# Patient Record
Sex: Male | Born: 1950 | Race: White | Hispanic: No | Marital: Single | Smoking: Never smoker
Health system: Southern US, Community
[De-identification: ages and names within clinical notes are randomized; demographics above are authoritative.]

## PROBLEM LIST (undated history)

## (undated) DIAGNOSIS — E785 Hyperlipidemia, unspecified: Secondary | ICD-10-CM

## (undated) DIAGNOSIS — E119 Type 2 diabetes mellitus without complications: Secondary | ICD-10-CM

## (undated) DIAGNOSIS — M1990 Unspecified osteoarthritis, unspecified site: Secondary | ICD-10-CM

## (undated) DIAGNOSIS — E669 Obesity, unspecified: Secondary | ICD-10-CM

## (undated) DIAGNOSIS — G4733 Obstructive sleep apnea (adult) (pediatric): Secondary | ICD-10-CM

## (undated) DIAGNOSIS — Z5181 Encounter for therapeutic drug level monitoring: Secondary | ICD-10-CM

## (undated) DIAGNOSIS — E039 Hypothyroidism, unspecified: Secondary | ICD-10-CM

## (undated) DIAGNOSIS — Z9989 Dependence on other enabling machines and devices: Secondary | ICD-10-CM

## (undated) DIAGNOSIS — I4891 Unspecified atrial fibrillation: Secondary | ICD-10-CM

## (undated) DIAGNOSIS — R7303 Prediabetes: Secondary | ICD-10-CM

## (undated) DIAGNOSIS — I10 Essential (primary) hypertension: Secondary | ICD-10-CM

## (undated) DIAGNOSIS — Z79899 Other long term (current) drug therapy: Secondary | ICD-10-CM

## (undated) HISTORY — PX: EYE SURGERY: SHX253

## (undated) HISTORY — DX: Essential (primary) hypertension: I10

## (undated) HISTORY — PX: STAPEDES SURGERY: SHX789

## (undated) HISTORY — PX: FINGER FRACTURE SURGERY: SHX638

## (undated) HISTORY — PX: TYMPANOPLASTY: SHX33

## (undated) HISTORY — PX: SHOULDER ARTHROSCOPY W/ ROTATOR CUFF REPAIR: SHX2400

## (undated) HISTORY — PX: FRACTURE SURGERY: SHX138

## (undated) HISTORY — DX: Obesity, unspecified: E66.9

## (undated) HISTORY — PX: KNEE ARTHROSCOPY W/ DEBRIDEMENT: SHX1867

## (undated) HISTORY — DX: Hyperlipidemia, unspecified: E78.5

## (undated) HISTORY — DX: Type 2 diabetes mellitus without complications: E11.9

## (undated) HISTORY — PX: TONSILLECTOMY: SUR1361

## (undated) HISTORY — PX: RETINAL LASER PROCEDURE: SHX2339

---

## 2000-11-07 ENCOUNTER — Ambulatory Visit (HOSPITAL_COMMUNITY): Admission: RE | Admit: 2000-11-07 | Discharge: 2000-11-07 | Payer: Self-pay | Admitting: Gastroenterology

## 2004-02-14 ENCOUNTER — Encounter: Admission: RE | Admit: 2004-02-14 | Discharge: 2004-05-14 | Payer: Self-pay | Admitting: Family Medicine

## 2004-02-28 ENCOUNTER — Ambulatory Visit (HOSPITAL_COMMUNITY): Admission: RE | Admit: 2004-02-28 | Discharge: 2004-02-28 | Payer: Self-pay | Admitting: Orthopedic Surgery

## 2004-04-17 ENCOUNTER — Ambulatory Visit (HOSPITAL_BASED_OUTPATIENT_CLINIC_OR_DEPARTMENT_OTHER): Admission: RE | Admit: 2004-04-17 | Discharge: 2004-04-17 | Payer: Self-pay | Admitting: Orthopedic Surgery

## 2004-04-21 ENCOUNTER — Ambulatory Visit (HOSPITAL_COMMUNITY): Admission: RE | Admit: 2004-04-21 | Discharge: 2004-04-21 | Payer: Self-pay | Admitting: Orthopedic Surgery

## 2006-10-13 ENCOUNTER — Ambulatory Visit (HOSPITAL_BASED_OUTPATIENT_CLINIC_OR_DEPARTMENT_OTHER): Admission: RE | Admit: 2006-10-13 | Discharge: 2006-10-14 | Payer: Self-pay | Admitting: Orthopedic Surgery

## 2006-12-27 ENCOUNTER — Encounter: Admission: RE | Admit: 2006-12-27 | Discharge: 2007-03-27 | Payer: Self-pay | Admitting: Family Medicine

## 2007-03-13 ENCOUNTER — Encounter: Admission: RE | Admit: 2007-03-13 | Discharge: 2007-03-13 | Payer: Self-pay | Admitting: Family Medicine

## 2007-11-05 ENCOUNTER — Encounter: Admission: RE | Admit: 2007-11-05 | Discharge: 2007-11-05 | Payer: Self-pay | Admitting: Family Medicine

## 2008-01-01 ENCOUNTER — Encounter: Admission: RE | Admit: 2008-01-01 | Discharge: 2008-01-01 | Payer: Self-pay | Admitting: Family Medicine

## 2009-03-05 ENCOUNTER — Ambulatory Visit (HOSPITAL_BASED_OUTPATIENT_CLINIC_OR_DEPARTMENT_OTHER): Admission: RE | Admit: 2009-03-05 | Discharge: 2009-03-05 | Payer: Self-pay | Admitting: Orthopedic Surgery

## 2009-10-22 ENCOUNTER — Ambulatory Visit (HOSPITAL_BASED_OUTPATIENT_CLINIC_OR_DEPARTMENT_OTHER): Admission: RE | Admit: 2009-10-22 | Discharge: 2009-10-22 | Payer: Self-pay | Admitting: Orthopedic Surgery

## 2010-08-24 LAB — POCT I-STAT 4, (NA,K, GLUC, HGB,HCT)
Glucose, Bld: 124 mg/dL — ABNORMAL HIGH (ref 70–99)
HCT: 41 % (ref 39.0–52.0)
Hemoglobin: 13.9 g/dL (ref 13.0–17.0)
Potassium: 3.9 mEq/L (ref 3.5–5.1)
Sodium: 137 mEq/L (ref 135–145)

## 2010-09-11 LAB — BASIC METABOLIC PANEL
BUN: 23 mg/dL (ref 6–23)
CO2: 32 mEq/L (ref 19–32)
Calcium: 9.8 mg/dL (ref 8.4–10.5)
Chloride: 100 mEq/L (ref 96–112)
Creatinine, Ser: 1.22 mg/dL (ref 0.4–1.5)
GFR calc Af Amer: >60 mL/min (ref >60)
GFR calc non Af Amer: >60 mL/min (ref >60)
Glucose, Bld: 104 mg/dL — ABNORMAL HIGH (ref 70–99)
Potassium: 5.1 mEq/L (ref 3.5–5.1)
Sodium: 140 mEq/L (ref 135–145)

## 2010-09-11 LAB — POCT HEMOGLOBIN-HEMACUE: Hemoglobin: 15.7 g/dL (ref 13.0–17.0)

## 2010-10-23 NOTE — Op Note (Signed)
Anthony Cole, Anthony Cole             ACCOUNT NO.:  [id number]   MEDICAL RECORD NO.:  [id number]          PATIENT TYPE:  AMB   LOCATION:  DSC                          FACILITY:  [facility]   PHYSICIAN:  Katy Fitch. Sypher, M.D. 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  Also  significant labral degenerative changes within the glenohumeral joint  that were dressed by labral debridement.   OPERATION:  1. Examination of left shoulder under anesthesia, documenting slight      loss of elevation, external rotation and shoulder extension.  2. Diagnostic arthroscopy, left glenohumeral joint, confirming      extensive degenerative labral changes, a significant articular side      degenerative supraspinatus and infraspinatus tendinopathy with      about a 38-40% partial thickness supraspinatus rotator cuff      degenerative tear with retraction of the deep surface fibers.  3. Arthroscopic subacromial decompression with bursectomy, partial      resection of coracoacromial ligament and extensive medial      acromioplasty.  4. Partial resection of distal clavicle with preservation of AC joint      capsule to os acromiale for long-term stabilization of os      acromiale.   OPERATING SURGEON:  Katy Fitch. Sypher, MD.   ASSISTANTMolly Maduro Dasnoit PA-C.   ANESTHESIA:  General endotracheal supplemented by a left interscalene  block.   SUPERVISING  ANESTHESIOLOGIST:  Janetta Hora. Gelene Mink, MD.   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   He is status post treatment of his right shoulder in 04/2004 for an  unstable os acromiale with chronic impingement and AC arthropathy.  He  at that time was noted to have impingement signs.  An MRI obtained at  [facility] documented an os acromiale with  supraspinatus tendinopathy.   He was taken to the operating room and underwent an arthroscopic  subacromial decompression and distal clavicle resection.  His symptoms  were well controlled by this intervention.   After appropriate rehabilitation, he was discharged and did not return  for followup evaluation for nearly 2 years.   He subsequently returned in 08/2006 concerned about similar symptoms  involving in the left shoulder.   In 2005, we did obtain an MRI of the left shoulder and confirmed the  presence of an os acromiale on the left, as well.   The patient has failed nonoperative management of the left shoulder and  requested that we proceed with an identical procedure to decompress the  left shoulder.  Preoperatively he was evaluated by Dr. Garnette Scheuermann from a cardiology  standpoint and was noted to have a negative stress Cardiolite  preoperative study.  He was cleared by Dr. Katrinka Blazing for general anesthesia.  After informed consent, he was brought to the operating room at this  time.   PROCEDURE:  The patient is brought to the operating room and placed in  the supine position on the operating table.   Following an Anesthesia consult by Dr. Gelene Mink in the holding area, a  left interscalene block was placed without complication.   He was taken to room 2 and placed in the supine position on the table,  and, under Dr. Thornton Dales direct supervision, general endotracheal  anesthesia induced.   He was carefully positioned in  the beach-chair position with the aid of  torso and head holder designed for shoulder arthroscopy.  Examination of  left shoulder under anesthesia revealed elevation of 165, external  rotation 80, internal rotation of 70.  Extension lacked 5 degrees from  the scapular plane.  The shoulder joint was stable to anterior,  posterior and inferior stress.   The shoulder was then distended with 20 cc of sterile saline with  anterior spinal needle approach followed by instrumentation through the  posterior soft spot with a blunt trocar arthroscopic cannula.  Diagnostic arthroscopy confirmed degenerative changes in the labrum and  an unexpected finding of a small area of full-thickness hyaline  cartilage injury to the inferior humeral head just at the position of  the inferior labrum contacting the humeral head with the arm in neutral.  The deep surface of the rotator cuff was inspected.  There was a 30-40%  partial-thickness tear of the supraspinatus with fragments hanging  within the joint.  The labrum was degenerative from approximately 10  o'clock posteriorly to 3 o'clock anteriorly.   An anterior portal was created under direct vision followed by use of a  4.5-mm suction shaver to debride the labrum, adhesive capsulitis tissues  and the partial-thickness rotator cuff tear.   After debridement, photographic documentation of the delaminating cuff  predicament was obtained.   In my judgment, this did not deserve repair at this time.   Attention was then directed to instrumenting the subacromial bursa.   The scope was placed in the subacromial bursa for posterior approach.  We were very challenged to image the subacromial space due to florid  bursitis and adhesion of the supraspinatus to the bursa.  We ultimately  visualized the subacromial space by placing the scope laterally and  shaving from a posterior position.  Once the anatomy of the coracoacromial arch was able to be visualized, it  was apparent that the  primary source of impingement was the medial acromion.  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   The suction shaver was used to debride the capsule followed by use of  the bipolar cautery to obtain hemostasis.  The medial acromion was  leveled to a type-1 morphology with partial resection of the acromion.  Care was taken not to resect the coracoacromial ligament nor the bulk of  the capsule of the [facility] joint.  Our goal was to decompress the medial  osteophyte without disturbing the soft tissue attachments to the os  acromiale.   After proper documentation, the digital camera was used to document the  degree of distal clavicle resection as well as acromioplasty.  We  created a congruous  decompression with approximately 6 mm of clearance.   After hemostasis was achieved, the arthroscopic equipment was removed.  The cuff was inspected thoroughly, and there was no sign of a bursal  side rotator cuff tear or partial thickness injury.   The portals then repaired with mattress suture of 3-0 Prolene.  A  voluminous dressing was applied with sterile gauze, ABD pad and paper  tape.   There no apparent complications.Katy Fitch Sypher, M.D.  Electronically Signed     RVS/MEDQ  D:  10/13/2006  T:  10/13/2006  Job:  [id number]   cc:   Chales Salmon. Abigail Miyamoto, M.D.

## 2010-10-23 NOTE — Procedures (Signed)
[facility]  Patient:    Anthony Cole, Anthony Cole                      MRN: [id number] Proc. Date: 11/07/00 Adm. Date:  [id number] Attending:  Orland Mustard CC:         Chales Salmon. Abigail Miyamoto, M.D.   Procedure Report  PROCEDURE:  Colonoscopy.  MEDICATIONS:  Fentanyl 62.5 mcg, Versed 8 mg IV.  INDICATIONS FOR PROCEDURE:  A patient with heme positive stools. This procedure is done to evaluate for chronic source.  DESCRIPTION OF PROCEDURE:  The procedure had been explained to the patient and consent obtained. With the patient in the left lateral decubitus position, the adult Olympus video colonoscope was inserted and after a digital rectal exam advanced under direct visualization. The prep was excellent. We were able to advance to the cecum without difficulty. The ileocecal valve and appendiceal orifice seen, scope withdrawn. The cecum, ascending colon, hepatic flexure, transverse colon, splenic flexure, descending and sigmoid colon were seen well upon removal. No polyp seen throughout the entire colon. There was no significant diverticular disease. The scope was withdrawn. The patient tolerated the procedure well.  ASSESSMENT:  Heme positive stools.  PLAN:  Will see back in the office in two months to recheck the stools. DD:  11/07/00 TD:  11/08/00 Job: [id number]

## 2010-12-28 ENCOUNTER — Ambulatory Visit: Payer: Self-pay | Admitting: Physical Therapy

## 2010-12-31 ENCOUNTER — Other Ambulatory Visit: Payer: Self-pay | Admitting: Family Medicine

## 2010-12-31 DIAGNOSIS — M545 Low back pain, unspecified: Secondary | ICD-10-CM

## 2011-01-02 ENCOUNTER — Ambulatory Visit
Admission: RE | Admit: 2011-01-02 | Discharge: 2011-01-02 | Disposition: A | Discharge status: Home / Self Care | Payer: Self-pay | Source: Ambulatory Visit | Attending: Family Medicine | Admitting: Family Medicine

## 2011-01-02 DIAGNOSIS — M545 Low back pain: Secondary | ICD-10-CM

## 2011-10-27 ENCOUNTER — Other Ambulatory Visit: Payer: Self-pay | Admitting: Interventional Cardiology

## 2011-10-28 ENCOUNTER — Other Ambulatory Visit: Payer: Self-pay | Admitting: Cardiology

## 2011-10-28 NOTE — H&P (Signed)
HPI:  General:  Anthony Cole is a 61 yo male followed by Dr Katrinka Blazing with a hx of hypertension,, peripheral neuropathy and recent echocardiogram with EF 55%, LVH, LAE, and Aortic root enlargement. He was seen by Dr Katrinka Blazing last month due to onset of Atrial fibrillation of unknown duration which was identified at his annual physical with Dr Tenny Craw. Holter moniotor confirmed 100% Atrial fibrillation with rate controlled. Patient had noted occasional palpitations and elevated heart rate with exercise since January along with dypnea when walking up stairs. He was placed on Xarelto 20 mg po qd on 10/06/11, with plans to consider cardioversion in hopes of returning to NSR after adequately anticoagulated..  Patient denies chest pain, syncope, swelling, nor PND. He has mild occasional dizziness, no vision change.      ROS:  as noted in HPI, no fever, chills, congestion, no neurological changes ,no black or bloody BMS. no respiratory complaints,.       Medical History: elevated PSA, BPH and hypogonadism followed by urologist Dr Earlene Plater, Hypertension, benign, Hypercholesterolemia, Brachial neuritis, [place], Dr Terri Piedra, Atrial fibrillation.        Surgical History: Arthroscopy right knee followed by Dr Despina Hick 03/05/2009, tonsillectomy remotely , left shoulder surgery 2008 , 2007 with right ossiculoplasty , right shoulder surgery 2005 , left knee surgery 10/2009.        Family History: Father: deceased Cause unknown to patient Mother: deceased Lung and breast cancer 1 brother(s) .  Negative GI family history of colon cancer, polyps, or liver disease.       Social History:  General: History of smoking cigarettes: Never smoked. no Smoking. Alcohol: yes, Occasional. Caffeine: yes, 1 serving daily, coffee. no Recreational drug use. Diet: yes, low fat, fruits and vegetables, Whole foods salad bar. Exercise: yes, 5 or 6 times a wk, gym. Aerobic plus 3 days of weights a week, variety. Occupation: Retired. Marital Status: single.  Children: 0.        Medications: Fish Oil 1200 MG Capsule 1 capsule once a day, Metanx 3-35-2 MG Tablet 1 tablet once a day, Doxycycline Hyclate 100 MG Tablet 1 tablet once a day, Vitamin D 400 mg Capsule 1 capsule twice a day, MVI 1 tablet Once a day, Flax Seeds Powder 2 TBS every morning, Xarelto 20 mg . tablet 1 tablet once a day, Metoprolol Succinate 50 MG Tablet Extended Release 24 Hour 1 tablet Once every evening, Crestor 10 MG Tablet 1 tablet Once a day, Medication List reviewed and reconciled with the patient       Allergies: Joint med OTC: Broke face out: Side Effects, Gabapentin: sedation: Side Effects, Lipitor: Myalgias, felt bad: Side Effects.       Objective:     Vitals: Wt 170.8, Wt change 1.4 lb, Ht 69.5, BMI 24.86, Pulse sitting 80, BP sitting 140/90.       Examination:  Cardiology Exam:  GENERAL APPEARANCE: pleasant, NAD, comfortable, thin, young, male. HEENT: normal. CAROTID UPSTROKE: no bruit, upstrokes intact. JVD: flat. HEART: irregularly irregular rate and rhythm, normal S1S2, no rub, no gallop, or click. HEART MURMUR: none. LUNGS: clear to auscultation, no wheezing/rhonchi/rales. ABDOMEN: soft, non-tender, + bowel sounds. EXTREMITIES: no leg edema. PERIPHERAL PULSES: 2+, bilateral. NEUROLOGIC: grossly intact, cranial nerves intact, gait WNL. MOOD: normal.        Assessment:     Assessment:  1. Atrial fibrillation - 427.31 (Primary)  2. Essential hypertension, benign - 401.1  3. Diastolic dysfunction - 429.9    Plan:  1. Atrial fibrillation Continue Xarelto 20 mg tablet, ., 1 tablet, orally, once a day ; Continue Metoprolol Succinate Tablet Extended Release 24 Hour, 50 MG, 1 tablet, Orally, Once every evening .  LAB: Comp Metabolic Panel    GLUCOSE 86 70-99 - mg/dL    BUN 19 1-61 - mg/dL    CREATININE 0.96 0.45-4.09 - mg/dl    eGFR (NON-AFRICAN AMERICAN) 80 >60 - calc    eGFR (AFRICAN AMERICAN) 97 >60 - calc    SODIUM 140 136-145 - mmol/L    POTASSIUM  4.1 3.5-5.5 - mmol/L    CHLORIDE 102 98-107 - mmol/L    C02 32 22-32 - mg/dL    ANION GAP 9.1 8.1-19.1 - mmol/L    CALCIUM 9.5 8.6-10.3 - mg/dL    T PROTEIN 6.1 4.7-8.2 - g/dL    ALBUMIN 4.4 9.5-6.2 - g/dL    T.BILI 0.7 0.3-1.0 - mg/dL    ALP 46 13-086 - U/L    AST 49 0-39 - U/L H   ALT 74 0-52 - U/L H    Anthony Cole A 10/27/2011 04:37:35 PM > ok for cardioversion, CC to Dr Tenny Craw for update increasing ALT    LAB: CBC with Diff    WBC 8.2 4.0-11.0 - K/ul    RBC 4.56 4.20-5.80 - M/uL    HGB 14.7 13.0-17.0 - g/dL    HCT 57.8 46.9-62.9 - %    MCH 32.3 27.0-33.0 - pg    MPV 8.7 7.5-10.7 - fL    MCV 94.4 80.0-94.0 - fL H   MCHC 34.2 32.0-36.0 - g/dL    RDW 52.8 41.3-24.4 - %    NRBC# 0.00 -    PLT 218 150-400 - K/uL    NEUT % 57.9 43.3-71.9 - %    NRBC% 0.00 - %    LYMPH% 28.3 16.8-43.5 - %    MONO % 11.6 4.6-12.4 - %    EOS % 1.5 0.0-7.8 - %    BASO % 0.7 0.0-1.0 - %    NEUT # 4.7 1.9-7.2 - K/uL    LYMPH# 2.30 1.10-2.70 - K/uL    MONO # 0.9 0.3-0.8 - K/uL H   EOS # 0.1 0.0-0.6 - K/uL    BASO # 0.1 0.0-0.1 - K/uL     Anthony Cole A 10/27/2011 12:29:22 PM > ok for cardioversion    LAB: PT (Prothrombin Time) (010272)    Prothrombin Time 13.1 9.1-12.0 - SEC H   INR 1.2 0.8-1.2 -     Anthony Cole A 10/28/2011 12:46:03 PM > for cardioversion, OK    Diagnostic Imaging:EKG Atrial fibrillation rate 67, Anthony Cole,Anthony Cole 10/27/2011 08:37:50 AM >, Chest PA/Lat (Ordered for 10/27/2011) Anthony Cole,Anthony Cole 10/27/2011 09:51:52 AM > , x-ray completed.  At last office visit with Dr Katrinka Blazing, he reviewed in depth options and treatment for Atrial fibrillation with plans to proceed with electrical cardioversion, Risk and Procedure reviewed including the risk of stroke, skin burn, mechanical injury, cardiac arrest, and death. The incidence of life threatening arrhythmias is less than 1% He is aware that an anesthesiologist will provide sedation/anesthesia, > 30 minutes spent today reviewing Atrial  fibrillation, cardioversion procedure and treatment options.       2. Others Continue Crestor Tablet, 10 MG, 1 tablet, Orally, Once a day ; Continue Flax Seeds Powder, 2 TBS, Orally, every morning ; Continue Fish Oil Capsule, 1200 MG, 1 capsule, Orally, once a day .        Immunizations:  Labs:        Procedure Codes: 09811 EKG I AND R, 80053 ECL COMP METABOLIC PANEL, 85025 ECL CBC PLATELET DIFF, 91478 BLOOD COLLECTION ROUTINE VENIPUNCTURE       Preventive:         Follow Up: HS post cardioversion (Reason: Atrial fibrillation)      Provider: Michaell Cowing. Emelda Fear, NP  Patient: Anthony Cole, Anthony Cole DOB: [birth year] Date: 10/27/2011

## 2011-11-02 ENCOUNTER — Encounter (HOSPITAL_COMMUNITY): Payer: Self-pay | Admitting: Pharmacy Technician

## 2011-11-04 ENCOUNTER — Ambulatory Visit (HOSPITAL_COMMUNITY): Payer: BC Managed Care – PPO | Admitting: Critical Care Medicine

## 2011-11-04 ENCOUNTER — Encounter (HOSPITAL_COMMUNITY)
Admission: RE | Disposition: A | Discharge status: Home / Self Care | Payer: Self-pay | Source: Ambulatory Visit | Attending: Interventional Cardiology

## 2011-11-04 ENCOUNTER — Encounter (HOSPITAL_COMMUNITY): Payer: Self-pay | Admitting: Critical Care Medicine

## 2011-11-04 ENCOUNTER — Ambulatory Visit (HOSPITAL_COMMUNITY)
Admission: RE | Admit: 2011-11-04 | Discharge: 2011-11-04 | Disposition: A | Discharge status: Home / Self Care | Payer: BC Managed Care – PPO | Source: Ambulatory Visit | Attending: Interventional Cardiology | Admitting: Interventional Cardiology

## 2011-11-04 DIAGNOSIS — I4891 Unspecified atrial fibrillation: Secondary | ICD-10-CM | POA: Insufficient documentation

## 2011-11-04 HISTORY — PX: CARDIOVERSION: SHX1299

## 2011-11-04 SURGERY — CARDIOVERSION
Anesthesia: General | Wound class: Clean

## 2011-11-04 MED ORDER — PROPOFOL 10 MG/ML IV BOLUS
INTRAVENOUS | Status: DC | PRN
  Administered 2011-11-04: 80 mg via INTRAVENOUS
  Administered 2011-11-04: 120 mg via INTRAVENOUS

## 2011-11-04 MED ORDER — SODIUM CHLORIDE 0.9 % IV SOLN
INTRAVENOUS | Status: DC | PRN
  Administered 2011-11-04: 12:00:00 via INTRAVENOUS

## 2011-11-04 NOTE — Preoperative (Signed)
Beta Blockers   Reason not to administer Beta Blockers:Not Applicable 

## 2011-11-04 NOTE — H&P (Signed)
61 year old gentleman with a history of atrial fibrillation of unknown duration. Left atrial size by echo was 41 mm. He's been on anticoagulation for 4 weeks. He is undergone electrical cardioversion to reestablish normal sinus rhythm. He understands the procedure and the potential risk of musculoskeletal injury, lethal arrhythmia, infection, allergy, among others. He is willing to proceed.

## 2011-11-04 NOTE — Anesthesia Postprocedure Evaluation (Signed)
  Anesthesia Post-op Note  Patient: Anthony Cole  Procedure(s) Performed: Procedure(s) (LRB): CARDIOVERSION (N/A)  Patient Location: PACU  Anesthesia Type: MAC and General  Level of Consciousness: awake  Airway and Oxygen Therapy: Patient Spontanous Breathing  Post-op Pain: mild  Post-op Assessment: Post-op Vital signs reviewed  Post-op Vital Signs: Reviewed  Complications: No apparent anesthesia complications

## 2011-11-04 NOTE — Discharge Instructions (Signed)
PATIENT INSTRUCTIONS  POST-ANESTHESIA    IMMEDIATELY FOLLOWING SURGERY:  Do not drive or operate machinery for the first twenty four hours after surgery.  Do not make any important decisions for twenty four hours after surgery or while taking narcotic pain medications or sedatives.  If you develop intractable nausea and vomiting or a severe headache please notify your doctor immediately.    FOLLOW-UP:  Please make an appointment with your surgeon as instructed. You do not need to follow up with anesthesia unless specifically instructed to do so    QUESTIONS?:  Please feel free to call your physician or the hospital operator if you have any questions, and they will be happy to assist you.       [facility] Anesthesia Department  [address]  [place]  [phone]

## 2011-11-04 NOTE — Anesthesia Preprocedure Evaluation (Addendum)
Anesthesia Evaluation  Patient identified by MRN, date of birth, ID band Patient awake    Reviewed: Allergy & Precautions, H&P , NPO status , Patient's Chart, lab work & pertinent test results, reviewed documented beta blocker date and time   Airway       Dental  (+) Dental Advisory Given   Pulmonary  Dr. Katrinka Blazing discussed CXR report with patient prior to procedure. CE         Cardiovascular hypertension, Pt. on home beta blockers     Neuro/Psych Peripheral neuropathy negative neurological ROS     GI/Hepatic   Endo/Other  negative endocrine ROS  Renal/GU negative Renal ROS     Musculoskeletal   Abdominal   Peds  Hematology negative hematology ROS (+)   Anesthesia Other Findings   Reproductive/Obstetrics                        Anesthesia Physical Anesthesia Plan  ASA: II  Anesthesia Plan: General   Post-op Pain Management:    Induction: Intravenous  Airway Management Planned: Mask  Additional Equipment:   Intra-op Plan:   Post-operative Plan:   Informed Consent: I have reviewed the patients History and Physical, chart, labs and discussed the procedure including the risks, benefits and alternatives for the proposed anesthesia with the patient or authorized representative who has indicated his/her understanding and acceptance.   Dental advisory given  Plan Discussed with: CRNA, Anesthesiologist and Surgeon  Anesthesia Plan Comments:         Anesthesia Quick Evaluation

## 2011-11-04 NOTE — CV Procedure (Signed)
Electrical Cardioversion Procedure Note Anthony Cole [id number] September 16, 1950  Procedure: Electrical Cardioversion Indications:  Atrial Fibrillation  Time Out: Verified patient identification, verified procedure,medications/allergies/relevent history reviewed, required imaging and test results available.  Performed  Procedure Details  The patient was NPO after midnight. Anesthesia was administered at the beside  by Dr. Doretha Sou with 120 mg of propofol.  Cardioversion was done with synchronized biphasic defibrillation with AP pads with 200 watts biphasic.  The patient converted to normal sinus rhythm/sinus bradycardia. The patient tolerated the procedure well .  IMPRESSION:  Successful cardioversion of atrial fibrillation to sinus bradycardia.    Anthony Cole 11/04/2011, 12:33 PM

## 2011-11-04 NOTE — Anesthesia Procedure Notes (Signed)
Date/Time: 11/04/2011 12:23 PM Performed by: Elon Alas Pre-anesthesia Checklist: Patient identified, Emergency Drugs available, Suction available, Patient being monitored and Timeout performed Patient Re-evaluated:Patient Re-evaluated prior to inductionPreoxygenation: Pre-oxygenation with 100% oxygen Intubation Type: IV induction Ventilation: Mask ventilation without difficulty Placement Confirmation: breath sounds checked- equal and bilateral

## 2011-11-04 NOTE — Transfer of Care (Signed)
Immediate Anesthesia Transfer of Care Note  Patient: Anthony Cole  Procedure(s) Performed: Procedure(s) (LRB): CARDIOVERSION (N/A)  Patient Location: Short Stay  Anesthesia Type: General  Level of Consciousness: awake, alert  and oriented  Airway & Oxygen Therapy: Patient Spontanous Breathing  Post-op Assessment: Report given to PACU RN, Post -op Vital signs reviewed and stable and Patient moving all extremities X 4  Post vital signs: Reviewed and stable  Complications: No apparent anesthesia complications

## 2011-11-05 ENCOUNTER — Encounter (HOSPITAL_COMMUNITY): Payer: Self-pay | Admitting: Interventional Cardiology

## 2012-07-11 ENCOUNTER — Encounter: Payer: BC Managed Care – PPO | Attending: Family Medicine | Admitting: Dietician

## 2012-07-11 ENCOUNTER — Encounter: Payer: Self-pay | Admitting: Dietician

## 2012-07-11 VITALS — Ht 69.5 in | Wt 175.0 lb

## 2012-07-11 DIAGNOSIS — R7309 Other abnormal glucose: Secondary | ICD-10-CM | POA: Insufficient documentation

## 2012-07-11 DIAGNOSIS — Z713 Dietary counseling and surveillance: Secondary | ICD-10-CM | POA: Insufficient documentation

## 2012-07-11 DIAGNOSIS — E039 Hypothyroidism, unspecified: Secondary | ICD-10-CM | POA: Insufficient documentation

## 2012-07-11 NOTE — Progress Notes (Signed)
Medical Nutrition Therapy:  Appt start time: 1100 end time:  1200.   Assessment:  Primary concerns today: Comes today with a diagnosis of pre-diabetes with history of HTN, Hypercholesterolemia, and Atrial Fibrillation.  Comes wanting to know how to eat and to keep his glucose under control and not have to take diabetes medications.  Notes that a few months ago, his weight was at 170 lb and since then, he has been at times on a binge eating candy which has led to a 5.0 lb weight gain.  BLOOD GLUCOSE MONITORING: Monitoring 2-3 times per week. Does not have his meter or his glucose log.  This morning fasting his glucose was at 122 mg.  HYPERGLYCEMIA:  No noted S/S of high blood glucose.  HYPOGLYCEMIA:  No noted S/S of low blood glucose.     MEDICATIONS: Completed review of meds.   DIETARY INTAKE:  Usual eating pattern includes 3 meals and 1-2 snacks per day.  Everyday foods include vegetables, some starch, protein, milk.  Avoided foods include non noted.    24-hr recall: Water with thyroid med B ( AM): Ground chicken breast with sandwich thins OR 3 boiled eggs (2 as whites and 1 with yoke) and slice of bread (whole wheat, great harvest) and the Spinach mixture and skim made  with sometimes Ovaletine and sometimes some decaf coffee.  Steel cut oatmeal with almonds, and truvia or Splenda and water.     Snk ( AM): none Water L ( PM): salmon, piece and veggie,  OR salad bar at whole foods.  Fish, or chicken,  Quinoa broccoli, cauliflower, shredded beet, limit processed foods.occassionally a piece of bread. Skim milk   At the gym decaf tea and then 16 oz of water with crystal light. Snk ( PM): If hungry will have tuna or sardines with bread (1 piece) D ( PM): 5-6:00 PM  Pizza with lean chopped sirloin.  And water.  OR fish, chicken, (eats less beef) and veggies or the Spinach or collards. , baby kale  Snk ( PM): chocolate covered peanuts.  Increased intake over the last 2-3 weeks.  Beverages:  decaf coffee, skim milk, water water with crystal light.  Usual physical activity: Exercises 5-6 times per week.  This is 30 minutes of tim on the treadmill or bike or he Anthony Cole walk for 40 minutes in his neighborhood in good weather.  Estimated energy needs:   HT:  69.5 in  WT: 175.0 lbs   BMI: 25.5 kg/m2    Goal WT: 165-170 lb  1800-2000 calories  (when maintaining activity) 20--210 g carbohydrates 135-140 g protein 49-51 g fat  Progress Towards Goal(s):  In progress.   Nutritional Diagnosis:  [place]-2.1 Inpaired nutrition utilization As related to blood glucose.  As evidenced by diagnosis of pre-diabetes and fasting glusose of 122 mg/dl.    Intervention:  Nutrition Recommend continuing to monitor intake.  Continue to use the unsweetened beverages and the sugar substitute in moderation.  Read food labels and aim for fiber at 2 gm per slice of bread (whole grain or whole wheat) and 3+ gm of fiber per serving of cereals, vegetables.  When dining out, use the www.calorieking.com web site for referencing the carbs/sugar/fiber/calories per serving of various items.  Avoid sweets and candy.  Plan to have fruit as a source of sweets.  Monitor portion sizes, but plan them into your meals and snacks.  Continue to do your heart healthy practices.  Plan for a lean protein source at all  meals and snacks. Try keeping a diary of your intake for 2 week days and 1 weekend day and include carbs, calories in your records and your blood glucose readings.  Aim for 60 gm of CHO for meals and lean meats with 1-3 servings of fat at each meal.  Aim for 15 gm of carb with a lean protein for snacks.  Handouts given during visit include:  Living Well With Diabetes  Yellow Card with diet prescription and Carb servings/portions  Snack list   Menu with meal suggestions for 1800 calorie/60 gm CHO per meal  Monitoring/Evaluation:  Dietary intake, exercise, blood , and body weight In 12 weeks, check with insurance in the  meantime for coverage, and call or e-mail with questions.[name]

## 2012-07-19 ENCOUNTER — Encounter: Payer: Self-pay | Admitting: Dietician

## 2012-09-20 ENCOUNTER — Ambulatory Visit: Payer: BC Managed Care – PPO | Admitting: Dietician

## 2012-11-18 ENCOUNTER — Emergency Department (HOSPITAL_COMMUNITY)
Admission: EM | Admit: 2012-11-18 | Discharge: 2012-11-18 | Disposition: A | Discharge status: Home / Self Care | Payer: BC Managed Care – PPO | Attending: Emergency Medicine | Admitting: Emergency Medicine

## 2012-11-18 ENCOUNTER — Emergency Department (HOSPITAL_COMMUNITY)
Admission: EM | Admit: 2012-11-18 | Discharge: 2012-11-18 | Disposition: A | Discharge status: Home / Self Care | Payer: BC Managed Care – PPO | Source: Home / Self Care

## 2012-11-18 ENCOUNTER — Encounter (HOSPITAL_COMMUNITY): Payer: Self-pay | Admitting: Family Medicine

## 2012-11-18 DIAGNOSIS — H539 Unspecified visual disturbance: Secondary | ICD-10-CM

## 2012-11-18 DIAGNOSIS — E119 Type 2 diabetes mellitus without complications: Secondary | ICD-10-CM | POA: Insufficient documentation

## 2012-11-18 DIAGNOSIS — H538 Other visual disturbances: Secondary | ICD-10-CM | POA: Insufficient documentation

## 2012-11-18 DIAGNOSIS — E785 Hyperlipidemia, unspecified: Secondary | ICD-10-CM | POA: Insufficient documentation

## 2012-11-18 DIAGNOSIS — I4891 Unspecified atrial fibrillation: Secondary | ICD-10-CM | POA: Insufficient documentation

## 2012-11-18 DIAGNOSIS — I10 Essential (primary) hypertension: Secondary | ICD-10-CM | POA: Insufficient documentation

## 2012-11-18 DIAGNOSIS — Z79899 Other long term (current) drug therapy: Secondary | ICD-10-CM | POA: Insufficient documentation

## 2012-11-18 HISTORY — DX: Unspecified atrial fibrillation: I48.91

## 2012-11-18 NOTE — ED Notes (Signed)
CBG 102. Visual acuity 20/30 in both eyes, then 20/30 in right eye, 20/30 left eye all with glasses on.

## 2012-11-18 NOTE — ED Notes (Signed)
Patient discharged to home with family. NAD.  

## 2012-11-18 NOTE — ED Provider Notes (Signed)
History     CSN: [id number]  Arrival date & time 11/18/12  1627   None     Chief Complaint  Patient presents with  . Eye Problem    (Consider location/radiation/quality/duration/timing/severity/associated sxs/prior treatment) HPI Complains of floaters in left eye which are chronic. The pain becoming progressively worse since last night. He states sometimes he feels as if part of his vision is blocked. He denies pain anywhere. Denies other complaint. No treatment prior to coming here. No eye pain. Past Medical History  Diagnosis Date  . Diabetes mellitus without complication   . Hypertension   . Hyperlipidemia   . Atrial fibrillation     Past Surgical History  Procedure Laterality Date  . Cardioversion  11/04/2011    Procedure: CARDIOVERSION;  Surgeon: Lesleigh Noe, MD;  Location: [facility] OR;  Service: Cardiovascular;  Laterality: N/A;    History reviewed. No pertinent family history.  History  Substance Use Topics  . Smoking status: Never Smoker   . Smokeless tobacco: Never Used  . Alcohol Use: No      Review of Systems  Constitutional: Negative.   HENT: Negative.   Eyes: Positive for visual disturbance.  Respiratory: Negative.   Cardiovascular: Negative.   Gastrointestinal: Negative.   Musculoskeletal: Negative.   Skin: Negative.   Neurological: Negative.   Psychiatric/Behavioral: Negative.   All other systems reviewed and are negative.    Allergies  Gabapentin and Glucosamine forte  Home Medications   Current Outpatient Rx  Name  Route  Sig  Dispense  Refill  . amiodarone (PACERONE) 100 MG tablet   Oral   Take 100 mg by mouth daily.         . cholecalciferol (VITAMIN D) 1000 UNITS tablet   Oral   Take 1,000 Units by mouth every morning.         [name] doxycycline (VIBRA-TABS) 100 MG tablet   Oral   Take 100 mg by mouth every morning.         . Flaxseed, Linseed, (FLAXSEED OIL PO)   Oral   Take 5 mLs by mouth every morning.         [name]  L-Methylfolate-B6-B12 (FOLTANX PO)   Oral   Take 1 tablet by mouth every morning.         [name] levothyroxine (SYNTHROID, LEVOTHROID) 50 MCG tablet   Oral   Take 50 mcg by mouth daily.         . metoprolol succinate (TOPROL-XL) 50 MG 24 hr tablet   Oral   Take 50 mg by mouth every evening. Take with or immediately following a meal.         . Multiple Vitamin (MULITIVITAMIN WITH MINERALS) TABS   Oral   Take 1 tablet by mouth every morning.         [name] omega-3 acid ethyl esters (LOVAZA) 1 G capsule   Oral   Take 1 g by mouth every morning.         . rivaroxaban (XARELTO) 10 MG TABS tablet   Oral   Take 20 mg by mouth every morning.         . rosuvastatin (CRESTOR) 10 MG tablet   Oral   Take 10 mg by mouth every morning.           BP 148/81  Pulse 60  Temp(Src) 98 F (36.7 C) (Oral)  Resp 20  SpO2 98%  Physical Exam  Nursing note and vitals reviewed. Constitutional: He appears  well-developed and well-nourished.  HENT:  Head: Normocephalic and atraumatic.  Visual acuity left eye 20/30, right eye 20/30  Eyes: Conjunctivae and EOM are normal. Pupils are equal, round, and reactive to light. Left eye exhibits no discharge. No scleral icterus.  No visual field deficit  Neck: Neck supple. No tracheal deviation present. No thyromegaly present.  Cardiovascular: Normal rate and regular rhythm.   No murmur heard. Pulmonary/Chest: Effort normal and breath sounds normal.  Abdominal: Soft. Bowel sounds are normal. He exhibits no distension. There is no tenderness.  Musculoskeletal: Normal range of motion. He exhibits no edema and no tenderness.  Neurological: He is alert. Coordination normal.  Skin: Skin is warm and dry. No rash noted.  Psychiatric: He has a normal mood and affect.    ED Course  Procedures (including critical care time)  Labs Reviewed - No data to display No results found.   No diagnosis found.    MDM   I'm concerned about the detached  retina. I spoke with Dr. Dione Booze who will see patient immediately upon leaving here at his office Diagnosis scotomata        Doug Sou, MD 11/18/12 1755

## 2012-11-18 NOTE — ED Notes (Signed)
Per pt sts left eye floaters that started yesterday but worsening today. Denies pain.

## 2013-02-20 ENCOUNTER — Other Ambulatory Visit: Payer: Self-pay | Admitting: Interventional Cardiology

## 2013-02-20 DIAGNOSIS — I4891 Unspecified atrial fibrillation: Secondary | ICD-10-CM

## 2013-02-20 DIAGNOSIS — Z79899 Other long term (current) drug therapy: Secondary | ICD-10-CM

## 2013-03-26 ENCOUNTER — Other Ambulatory Visit: Payer: Self-pay | Admitting: *Deleted

## 2013-03-26 DIAGNOSIS — I4891 Unspecified atrial fibrillation: Secondary | ICD-10-CM

## 2013-03-28 ENCOUNTER — Encounter (INDEPENDENT_AMBULATORY_CARE_PROVIDER_SITE_OTHER): Payer: Self-pay

## 2013-03-28 ENCOUNTER — Other Ambulatory Visit: Payer: Self-pay | Admitting: Pharmacist

## 2013-03-28 ENCOUNTER — Ambulatory Visit (INDEPENDENT_AMBULATORY_CARE_PROVIDER_SITE_OTHER): Payer: BC Managed Care – PPO | Admitting: Interventional Cardiology

## 2013-03-28 ENCOUNTER — Encounter: Payer: Self-pay | Admitting: Interventional Cardiology

## 2013-03-28 VITALS — BP 151/87 | HR 64 | Wt 188.0 lb

## 2013-03-28 DIAGNOSIS — I10 Essential (primary) hypertension: Secondary | ICD-10-CM | POA: Insufficient documentation

## 2013-03-28 DIAGNOSIS — I4891 Unspecified atrial fibrillation: Secondary | ICD-10-CM

## 2013-03-28 DIAGNOSIS — Z79899 Other long term (current) drug therapy: Secondary | ICD-10-CM

## 2013-03-28 DIAGNOSIS — I480 Paroxysmal atrial fibrillation: Secondary | ICD-10-CM | POA: Insufficient documentation

## 2013-03-28 DIAGNOSIS — Z7901 Long term (current) use of anticoagulants: Secondary | ICD-10-CM | POA: Insufficient documentation

## 2013-03-28 DIAGNOSIS — Z5181 Encounter for therapeutic drug level monitoring: Secondary | ICD-10-CM | POA: Insufficient documentation

## 2013-03-28 LAB — HEPATIC FUNCTION PANEL
Alkaline Phosphatase: 58 U/L (ref 39–117)
Bilirubin, Direct: 0 mg/dL (ref 0.0–0.3)
Total Bilirubin: 0.6 mg/dL (ref 0.3–1.2)
Total Protein: 7.2 g/dL (ref 6.0–8.3)

## 2013-03-28 MED ORDER — RIVAROXABAN 20 MG PO TABS: 20.0000 mg | ORAL_TABLET | Freq: Every day | ORAL | Status: DC

## 2013-03-28 NOTE — Telephone Encounter (Signed)
Refill request for his Xarelto.  Recent hemoglobin 14.7 and Scr 1.08.  Okay to refill Xarelto 20 mg qd.  Done.

## 2013-03-28 NOTE — Patient Instructions (Addendum)
Lab today TSH, Hepatic panel  Your physician has recommended that you have a sleep study. This test records several body functions during sleep, including: brain activity, eye movement, oxygen and carbon dioxide blood levels, heart rate and rhythm, breathing rate and rhythm, the flow of air through your mouth and nose, snoring, body muscle movements, and chest and belly movement.  Your physician wants you to follow-up in: 6 months You will receive a reminder letter in the mail two months in advance. If you don't receive a letter, please call our office to schedule the follow-up appointment.  A chest x-ray takes a picture of the organs and structures inside the chest, including the heart, lungs, and blood vessels. This test can show several things, including, whether the heart is enlarges; whether fluid is building up in the lungs; and whether pacemaker / defibrillator leads are still in place.(To Be Scheduled April 2015)  Your physician recommends that you return for lab work in: April 2015 (TSH,Hepatic panel)

## 2013-03-28 NOTE — Progress Notes (Signed)
Patient ID: Anthony Cole, male   DOB: 1950-07-06, 62 y.o.   MRN: [id number]    [address] [place], [place]  [address] Phone: [phone] Fax:  [phone]  Date:  03/28/2013   ID:  Anthony Cole, DOB 02/23/1951, MRN [id number]  PCP:  Miguel Aschoff, MD   ASSESSMENT:  1. Paroxysmal atrial fibrillation  2. Amiodarone therapy  3. Chronic anticoagulation therapy,Xarelto  4. Hypertension  PLAN:  1. Continue same medical regimen. The patient decided against ablation(for now). He can decide if he wants to remain on amiodarone or switch to dofetilide or sotalol. I have recommended that he switch to one of the latter medications.  2. TSH and hepatic panel today and in 6 months 1 week followup he will have a chest x-ray along with the same blood work.  3. Six-month followup    Prolonged office visit due to multiple questions and anxiety.  SUBJECTIVE: Anthony Cole is a 62 y.o. male is very anxious to have a very few episodes of atrial fibrillation. He is exercising on a regular basis. He was to have the entire conversation again that he has had twice now with Dr. Macon Large. He is undecided about ablation versus continuation of amiodarone versus switching to another agent. He's had very few episodes of atrial fibrillation. He has no limitations. No bleeding on Xarelto.   Wt Readings from Last 3 Encounters:  03/28/13 198 lb (89.812 kg)  07/11/12 175 lb (79.379 kg)  11/04/11 170 lb (77.111 kg)     Past Medical History  Diagnosis Date  . Diabetes mellitus without complication   . Hypertension   . Hyperlipidemia   . Atrial fibrillation     Current Outpatient Prescriptions  Medication Sig Dispense Refill  . amiodarone (PACERONE) 200 MG tablet Take 200 mg by mouth daily before breakfast.      . B Complex Vitamins (VITAMIN B COMPLEX PO) Take 1 tablet by mouth daily.      . cholecalciferol (VITAMIN D) 1000 UNITS tablet Take 1,000 Units by mouth every  morning.      [name] doxycycline (VIBRA-TABS) 100 MG tablet Take 100 mg by mouth daily as needed (for rosacea on face).       [name] levothyroxine (SYNTHROID, LEVOTHROID) 50 MCG tablet Take 50 mcg by mouth daily.      . metoprolol succinate (TOPROL-XL) 50 MG 24 hr tablet Take 50 mg by mouth every evening. Take with or immediately following a meal.      . Multiple Vitamin (MULITIVITAMIN WITH MINERALS) TABS Take 1 tablet by mouth every morning.      . Rivaroxaban (XARELTO) 20 MG TABS tablet Take 1 tablet (20 mg total) by mouth daily.  30 tablet  5  . rosuvastatin (CRESTOR) 10 MG tablet Take 10 mg by mouth every morning.       No current facility-administered medications for this visit.    Allergies:    Allergies  Allergen Reactions  . Gabapentin Other (See Comments)    Causes Extreme sedation  . Other     Not sure of name of med-for overactive bladder-caused some adverse reactions, stopped taking immediately-per patient  . Glucosamine Forte [Nutritional Supplements] Rash    Higher doses Causes Facial rash on face, lower doses are ok.     Social History:  The patient  reports that he has never smoked. He has never used smokeless tobacco. He reports that he does not drink alcohol or use illicit drugs.  ROS:  Please see the history of present illness.   Anxiety about his health   All other systems reviewed and negative.   OBJECTIVE: VS:  BP 151/87  Pulse 64  Wt 198 lb (89.812 kg)  BMI 28.83 kg/m2 Well nourished, well developed, in no acute distress HEENT: normal Neck: JVD flat. Carotid bruit none  Cardiac:  normal S1, S2; RRR; no murmur Lungs:  clear to auscultation bilaterally, no wheezing, rhonchi or rales Abd: soft, nontender, no hepatomegaly Ext: Edema none. Pulses 2+ Skin: warm and dry Neuro:  CNs 2-12 intact, no focal abnormalities noted  EKG:  ECG from 03/05/13 reveals LVH with strain.       Signed, Darci Needle III, MD 03/28/2013 1:29 PM

## 2013-03-29 ENCOUNTER — Other Ambulatory Visit: Payer: Self-pay

## 2013-03-29 ENCOUNTER — Telehealth: Payer: Self-pay

## 2013-03-29 DIAGNOSIS — Z79899 Other long term (current) drug therapy: Secondary | ICD-10-CM

## 2013-03-29 NOTE — Telephone Encounter (Signed)
lmom. labs ok repeat 28mo

## 2013-03-29 NOTE — Telephone Encounter (Signed)
Message copied by Jarvis Newcomer on Thu Mar 29, 2013  3:13 PM ------      Message from: Verdis Prime      Created: Thu Mar 29, 2013  1:58 PM       Normal. We will repeat in 6 months to follow amio ------

## 2013-04-02 NOTE — Progress Notes (Signed)
Liver and thyroid are normal.

## 2013-05-04 ENCOUNTER — Telehealth: Payer: Self-pay | Admitting: Interventional Cardiology

## 2013-05-04 NOTE — Telephone Encounter (Signed)
New Problem:  Pt is asking if he needs his appt on 12/3. Pt was last seen on 10/22.... Pt wants to know when Dr. Katrinka Blazing wants to see him next. Please advise

## 2013-05-04 NOTE — Telephone Encounter (Signed)
After reviewing patients chart, patient is due to come back to office in April 2015 and labs due at that time also. Patient informed that appointments scheduled for 05/09/13 will be canceled.

## 2013-05-09 ENCOUNTER — Other Ambulatory Visit: Payer: BC Managed Care – PPO

## 2013-05-09 ENCOUNTER — Ambulatory Visit: Payer: BC Managed Care – PPO | Admitting: Interventional Cardiology

## 2013-05-21 ENCOUNTER — Other Ambulatory Visit: Payer: Self-pay | Admitting: Interventional Cardiology

## 2013-06-19 ENCOUNTER — Ambulatory Visit (INDEPENDENT_AMBULATORY_CARE_PROVIDER_SITE_OTHER): Payer: BC Managed Care – PPO

## 2013-06-19 ENCOUNTER — Telehealth: Payer: Self-pay

## 2013-06-19 ENCOUNTER — Telehealth: Payer: Self-pay | Admitting: Interventional Cardiology

## 2013-06-19 DIAGNOSIS — I4891 Unspecified atrial fibrillation: Secondary | ICD-10-CM

## 2013-06-19 NOTE — Telephone Encounter (Signed)
returned pt call.pt sts that he was at the gym yesterday.and will exercicing his heartrate increased to the 130's.pt sts that when he checked his radial pulse.it was not fast but he coulf feel skipped beats.pt sts that he beleiveds he is still out of rhythm today.pt sts that his asymptomatic.he denies chest pain, sob. Pt want to come into the office today for a nurse visit EKG. Ok per TXU Corpina Lurz.pt given instructions to come in today at 2:30pm.pt agreeable with plan and verbalized understanding.

## 2013-06-19 NOTE — Telephone Encounter (Signed)
Pt came in the office for a Nurse visit EKG.pt  EKG showed atrial fib with rate control. Pt EKG reviewed by Clemmie Krillhris Berge,NP.pt should currently be taking amiodarone 200mg  daily.pt sts that yesterday afternoon when he realized he was out of rhythm he took 400mg  of amiodarone, and today took 200mg  amiodarone bid.pt was very anxious and asked if something like this happened again if he could just walk in.adv pt that he should always call the office first.pt adv Dr.Smith will review his EKG and we will call tomorrow with Dr.Smith recommendations.

## 2013-06-19 NOTE — Telephone Encounter (Signed)
New message     On amiodarone----pt says he is now in afib.  Pt want to talk to a nurse.

## 2013-06-20 MED ORDER — AMIODARONE HCL 200 MG PO TABS: 200.0000 mg | ORAL_TABLET | Freq: Two times a day (BID) | ORAL | Status: DC

## 2013-06-20 NOTE — Telephone Encounter (Signed)
Continue Amiodarone 200 mg BID and setup for cardioversion on my next hospital day.

## 2013-06-20 NOTE — Telephone Encounter (Signed)
lmom for pt to call back. pt is to increase amiodarone to 200mg  bid.called pt to schedule possible Dccv with Dr.Smith for Fri 06/22/13.

## 2013-06-21 ENCOUNTER — Telehealth: Payer: Self-pay

## 2013-06-21 NOTE — Telephone Encounter (Signed)
called pt to give Dr.Smith recommendations. Increase amiodarone to 200mg  twice daily.f/u with Dr.Smith scheduled for 06/27/13 @1 :15 to discuss possible dccv.

## 2013-06-25 NOTE — Telephone Encounter (Signed)
error 

## 2013-06-27 ENCOUNTER — Ambulatory Visit (INDEPENDENT_AMBULATORY_CARE_PROVIDER_SITE_OTHER): Payer: BC Managed Care – PPO | Admitting: Interventional Cardiology

## 2013-06-27 ENCOUNTER — Encounter: Payer: Self-pay | Admitting: Interventional Cardiology

## 2013-06-27 VITALS — BP 134/82 | HR 70 | Ht 69.0 in | Wt 189.0 lb

## 2013-06-27 DIAGNOSIS — I480 Paroxysmal atrial fibrillation: Secondary | ICD-10-CM

## 2013-06-27 DIAGNOSIS — I4891 Unspecified atrial fibrillation: Secondary | ICD-10-CM

## 2013-06-27 DIAGNOSIS — I10 Essential (primary) hypertension: Secondary | ICD-10-CM

## 2013-06-27 DIAGNOSIS — Z79899 Other long term (current) drug therapy: Secondary | ICD-10-CM

## 2013-06-27 DIAGNOSIS — I4892 Unspecified atrial flutter: Secondary | ICD-10-CM | POA: Insufficient documentation

## 2013-06-27 DIAGNOSIS — Z7901 Long term (current) use of anticoagulants: Secondary | ICD-10-CM

## 2013-06-27 DIAGNOSIS — E119 Type 2 diabetes mellitus without complications: Secondary | ICD-10-CM

## 2013-06-27 LAB — BASIC METABOLIC PANEL
BUN: 23 mg/dL (ref 6–23)
CALCIUM: 9.6 mg/dL (ref 8.4–10.5)
CHLORIDE: 103 meq/L (ref 96–112)
CO2: 33 mEq/L — ABNORMAL HIGH (ref 19–32)
CREATININE: 1.2 mg/dL (ref 0.4–1.5)
GFR: 64.06 mL/min (ref >60.00)
Glucose, Bld: 104 mg/dL — ABNORMAL HIGH (ref 70–99)
Potassium: 4.8 mEq/L (ref 3.5–5.1)
Sodium: 141 mEq/L (ref 135–145)

## 2013-06-27 NOTE — Progress Notes (Signed)
Patient ID: Anthony Cole, male   DOB: 02/07/1951, 63 y.o.   MRN: [id number] Medical History: elevated PSA, BPH and hypogonadism followed by urologist Dr Earlene Plateravis, Hypertension, benign, Hypercholesterolemia, Brachial neuritis, Rosocea, Dr Terri PiedraLupton, Atrial fibrillation, 5/13 successful cardioversion then return of AFib.     [address] [place], [place]  [address] Phone: [phone] Fax:  [phone]  Date:  06/27/2013   ID:  Anthony Cole, DOB 02/07/1951, MRN [id number]  PCP:  Miguel AschoffOSS,ALLAN, MD   ASSESSMENT:  1. Atrial flutter with controlled ventricular response documented on 06/27/2013 2. History of atrial fibrillation with prior electrical cardioversion 2014 3. Chronic anticoagulation therapy 4. Hypertension 5. Diabetes  PLAN:  1. Continue amiodarone 400 mg daily 2. Continue anticoagulation daily 3. Elective cardioversion as soon as possible. Indication has been rediscussed with the patient. Risks including death, stroke, and mechanical injury have been discussed. The patient understands that the electrical cardioversion is not a permanent solution for atrial flutter or a cure but simply a means to get the heart back in rhythm. After some considerable discussion, he has decided to proceed. 4. Discussed medical management versus ablative procedures. He is not in favor of an ablation approach at this time. SUBJECTIVE: Anthony Cole is a 63 y.o. male who has a history of atrial fibrillation and prior electrical cardioversion in 2013 (May). He has been on amiodarone therapy for suppression. [facility] and management strategy was not changed. In December 2013 we decreased amiodarone to 100 mg per day. In April 2014 he redeveloped atrial fibrillation and we increased amiodarone dose to 200 mg per day. He spontaneously reverted back to normal sinus rhythm and remained in rhythm until 06/18/2013 when he noticed a rapid heart rate while  exercising. An EKG done at our office the next day demonstrated atrial fibrillation. I increased amiodarone dose to 200 mg twice a day. He returns today for management considerations. EKG today demonstrates atrial flutter with variable block. He is asymptomatic.   Wt Readings from Last 3 Encounters:  06/27/13 189 lb (85.73 kg)  03/28/13 188 lb (85.276 kg)  07/11/12 175 lb (79.379 kg)     Past Medical History  Diagnosis Date  . Diabetes mellitus without complication   . Hypertension   . Hyperlipidemia   . Atrial fibrillation     Current Outpatient Prescriptions  Medication Sig Dispense Refill  . amiodarone (PACERONE) 200 MG tablet Take 1 tablet (200 mg total) by mouth 2 (two) times daily.      . B Complex Vitamins (VITAMIN B COMPLEX PO) Take 1 tablet by mouth daily.      . cholecalciferol (VITAMIN D) 1000 UNITS tablet Take 1,000 Units by mouth every morning.      [name]. doxycycline (VIBRA-TABS) 100 MG tablet Take 100 mg by mouth daily as needed (for rosacea on face).       [name]. levothyroxine (SYNTHROID, LEVOTHROID) 50 MCG tablet Take 50 mcg by mouth daily.      . metoprolol succinate (TOPROL-XL) 50 MG 24 hr tablet Take 50 mg by mouth every evening. Take with or immediately following a meal.      . Multiple Vitamin (MULITIVITAMIN WITH MINERALS) TABS Take 1 tablet by mouth every morning.      . Rivaroxaban (XARELTO) 20 MG TABS tablet Take 1 tablet (20 mg total) by mouth daily.  30 tablet  5  . rosuvastatin (CRESTOR) 10 MG tablet Take 10 mg by mouth  every morning.       No current facility-administered medications for this visit.    Allergies:    Allergies  Allergen Reactions  . Gabapentin Other (See Comments)    Causes Extreme sedation  . Other     Not sure of name of med-for overactive bladder-caused some adverse reactions, stopped taking immediately-per patient  . Glucosamine Forte [Nutritional Supplements] Rash    Higher doses Causes Facial rash on face, lower doses are ok.      Social History:  The patient  reports that he has never smoked. He has never used smokeless tobacco. He reports that he does not drink alcohol or use illicit drugs.   ROS:  Please see the history of present illness.   No bleeding or neurological complaints. Decreased energy.  He has not had syncope. He denies angina. All other systems reviewed and negative.   OBJECTIVE: VS:  BP 134/82  Pulse 70  Ht 5\' 9"  (1.753 m)  Wt 189 lb (85.73 kg)  BMI 27.90 kg/m2 Well nourished, well developed, in no acute distress, but has gained weight. HEENT: normal Neck: JVD flat. Carotid bruit absent  Cardiac:  normal S1, S2; IIRR; no murmur Lungs:  clear to auscultation bilaterally, no wheezing, rhonchi or rales Abd: soft, nontender, no hepatomegaly Ext: Edema absent. Pulses 2+ bilateral Skin: warm and dry Neuro:  CNs 2-12 intact, no focal abnormalities noted  EKG:  Atrial flutter with controlled ventricular response at 70 beats per minute (typical flutter). This is a change from EKG done one week ago that revealed atrial fibrillation.     ECHOCARDIOGRAPHY: 2013 Conclusions: 1. There is mild aortic root dilatation. 2. Mild left atrial enlargement. 3. Left ventricular ejection fraction estimated by 2D at 50-55 percent. 4. There is moderate concentric left ventricle hypertrophy. 5. Trivial tricuspid regurgitation. 6. Trace mitral valve regurgitation. 7. Diastolic function difficult to evaluate in light of underlying atrial fibrillation. Signed, Darci Needle III, MD 06/27/2013 1:29 PM

## 2013-06-27 NOTE — Patient Instructions (Addendum)
Your physician recommends that you continue on your current medications as directed. Please refer to the Current Medication list given to you today.  LAB TODAY:BMET  Your physician has recommended that you have a Cardioversion (DCCV). Electrical Cardioversion uses a jolt of electricity to your heart either through paddles or wired patches attached to your chest. This is a controlled, usually prescheduled, procedure. Defibrillation is done under light anesthesia in the hospital, and you usually go home the day of the procedure. This is done to get your heart back into a normal rhythm. You are not awake for the procedure. Please see the instruction sheet given to you today. (Scheduled for 07/02/13 @10am )

## 2013-06-29 ENCOUNTER — Encounter (HOSPITAL_COMMUNITY): Payer: Self-pay | Admitting: Pharmacy Technician

## 2013-07-02 ENCOUNTER — Encounter (HOSPITAL_COMMUNITY): Payer: Self-pay | Admitting: *Deleted

## 2013-07-02 ENCOUNTER — Ambulatory Visit (HOSPITAL_COMMUNITY): Payer: BC Managed Care – PPO | Admitting: Certified Registered"

## 2013-07-02 ENCOUNTER — Encounter (HOSPITAL_COMMUNITY)
Admission: RE | Disposition: A | Discharge status: Home / Self Care | Payer: Self-pay | Source: Ambulatory Visit | Attending: Interventional Cardiology

## 2013-07-02 ENCOUNTER — Ambulatory Visit (HOSPITAL_COMMUNITY)
Admission: RE | Admit: 2013-07-02 | Discharge: 2013-07-02 | Disposition: A | Discharge status: Home / Self Care | Payer: BC Managed Care – PPO | Source: Ambulatory Visit | Attending: Interventional Cardiology | Admitting: Interventional Cardiology

## 2013-07-02 ENCOUNTER — Encounter (HOSPITAL_COMMUNITY): Payer: BC Managed Care – PPO | Admitting: Certified Registered"

## 2013-07-02 DIAGNOSIS — R972 Elevated prostate specific antigen [PSA]: Secondary | ICD-10-CM | POA: Insufficient documentation

## 2013-07-02 DIAGNOSIS — N400 Enlarged prostate without lower urinary tract symptoms: Secondary | ICD-10-CM | POA: Insufficient documentation

## 2013-07-02 DIAGNOSIS — I10 Essential (primary) hypertension: Secondary | ICD-10-CM | POA: Insufficient documentation

## 2013-07-02 DIAGNOSIS — E119 Type 2 diabetes mellitus without complications: Secondary | ICD-10-CM | POA: Insufficient documentation

## 2013-07-02 DIAGNOSIS — E7800 Pure hypercholesterolemia, unspecified: Secondary | ICD-10-CM | POA: Insufficient documentation

## 2013-07-02 DIAGNOSIS — I4891 Unspecified atrial fibrillation: Secondary | ICD-10-CM | POA: Insufficient documentation

## 2013-07-02 DIAGNOSIS — I4892 Unspecified atrial flutter: Secondary | ICD-10-CM

## 2013-07-02 DIAGNOSIS — E291 Testicular hypofunction: Secondary | ICD-10-CM | POA: Insufficient documentation

## 2013-07-02 HISTORY — PX: CARDIOVERSION: SHX1299

## 2013-07-02 SURGERY — CARDIOVERSION
Anesthesia: Monitor Anesthesia Care

## 2013-07-02 MED ORDER — SODIUM CHLORIDE 0.9 % IJ SOLN: 3.0000 mL | Freq: Two times a day (BID) | INTRAMUSCULAR | Status: DC

## 2013-07-02 MED ORDER — PROPOFOL 10 MG/ML IV BOLUS
INTRAVENOUS | Status: DC | PRN
  Administered 2013-07-02: 60 mg via INTRAVENOUS

## 2013-07-02 MED ORDER — SODIUM CHLORIDE 0.9 % IV SOLN: 250.0000 mL | INTRAVENOUS | Status: DC

## 2013-07-02 MED ORDER — AMIODARONE HCL 200 MG PO TABS: 200.0000 mg | ORAL_TABLET | Freq: Every day | ORAL | Status: DC

## 2013-07-02 MED ORDER — SODIUM CHLORIDE 0.9 % IJ SOLN: 3.0000 mL | INTRAMUSCULAR | Status: DC | PRN

## 2013-07-02 MED ORDER — LIDOCAINE HCL (CARDIAC) 20 MG/ML IV SOLN
INTRAVENOUS | Status: DC | PRN
  Administered 2013-07-02: 40 mg via INTRAVENOUS

## 2013-07-02 NOTE — Interval H&P Note (Signed)
History and Physical Interval Note:  07/02/2013 9:59 AM  Anthony Cole  has presented today for surgery, with the diagnosis of A FLUTTER  The various methods of treatment have been discussed with the patient and family. After consideration of risks, benefits and other options for treatment, the patient has consented to  Procedure(s): CARDIOVERSION (N/A) as a surgical intervention .  The patient's history has been reviewed, patient examined, no change in status, stable for surgery.  I have reviewed the patient's chart and labs.  Questions were answered to the patient's satisfaction.     Lesleigh NoeSMITH III,Genae Strine W

## 2013-07-02 NOTE — Anesthesia Postprocedure Evaluation (Signed)
  Anesthesia Post-op Note  Patient: Anthony Cole  Procedure(s) Performed: Procedure(s) with comments: CARDIOVERSION (N/A) - 10:07 elective cardioversion Lido 40mg , Propofol 60mg ,IV...200 joules, synched, pt presently in A flutter,...cardioverted to NSR...  Patient Location: Endoscopy Unit  Anesthesia Type:MAC  Level of Consciousness: awake, alert  and oriented  Airway and Oxygen Therapy: Patient Spontanous Breathing and Patient connected to nasal cannula oxygen  Post-op Pain: none  Post-op Assessment: Post-op Vital signs reviewed, Patient's Cardiovascular Status Stable, Respiratory Function Stable and Patent Airway  Post-op Vital Signs: Reviewed and stable  Complications: No apparent anesthesia complications

## 2013-07-02 NOTE — CV Procedure (Signed)
Electrical Cardioversion Procedure Note Anthony Cole [id number] 1950/09/04  Procedure: Electrical Cardioversion Indications:  Atrial Flutter  Time Out: Verified patient identification, verified procedure,medications/allergies/relevent history reviewed, required imaging and test results available.  Performed  Procedure Details  The patient was NPO after midnight. Anesthesia was administered at the beside  by Dr.G. Katrinka BlazingSmith with 60mg  of propofol.  Cardioversion was done with synchronized biphasic defibrillation with AP pads with 200watts.  The patient converted to normal sinus rhythm. The patient tolerated the procedure well   IMPRESSION:  Successful cardioversion of atrial flutter to NSR    SMITH III,HENRY W 07/02/2013, 10:12 AM

## 2013-07-02 NOTE — Transfer of Care (Signed)
Immediate Anesthesia Transfer of Care Note  Patient: Anthony Cole  Procedure(s) Performed: Procedure(s) with comments: CARDIOVERSION (N/A) - 10:07 elective cardioversion Lido 40mg , Propofol 60mg ,IV...200 joules, synched, pt presently in A flutter,...cardioverted to NSR...  Patient Location: Endoscopy Unit  Anesthesia Type:MAC  Level of Consciousness: awake, alert  and oriented  Airway & Oxygen Therapy: Patient Spontanous Breathing and Patient connected to nasal cannula oxygen  Post-op Assessment: Report given to PACU RN and Post -op Vital signs reviewed and stable  Post vital signs: Reviewed and stable  Complications: No apparent anesthesia complications

## 2013-07-02 NOTE — Discharge Instructions (Signed)
Electrical Cardioversion °Electrical cardioversion is the delivery of a jolt of electricity to change the rhythm of the heart. Sticky patches or metal paddles are placed on the chest to deliver the electricity from a device. This is done to restore a normal rhythm. A rhythm that is too fast or not regular keeps the heart from pumping well. °Electrical cardioversion is done in an emergency if:  °· There is low or no blood pressure as a result of the heart rhythm.   °· Normal rhythm must be restored as fast as possible to protect the brain and heart from further damage.   °· It may save a life. °Cardioversion may be done for heart rhythms that are not immediately life-threatening, such as atrial fibrillation or flutter, in which:  °· The heart is beating too fast or is not regular.   °· Medicine to change the rhythm has not worked.   °· It is safe to wait in order to allow time for preparation. °· Symptoms of the abnormal rhythm are bothersome. °· The risk of stroke and other serious complications can be reduced. °LET YOUR CAREGIVER KNOW ABOUT:  °· All medicines you are taking, including vitamins, herbs, eye drops, creams, and over-the-counter medicines.   °· Previous problems you or members of your family have had with the use of anesthetics.   °· Any blood disorders you have.   °· Previous surgeries you have had.   °· Medical conditions you have. °RISKS AND COMPLICATIONS  °Generally, this is a safe procedure. However, as with any procedure, complications can occur. Possible complications include:  °· Breathing problems related to the anesthetic used. °· Cardiac arrest This risk is rare. °· A blood clot that breaks free and travels to other parts of your body. This could cause a stroke or other problems. The risk of this is lowered by use of blood thinning medicine (anticoagulant) prior to the procedure. °BEFORE THE PROCEDURE  °· You may have tests to detect blood clots in your heart and evaluate heart  function.  °· You may start taking anticoagulants so your blood does not clot as easily.   °· Medicines may be given to help stabilize your heart rate and rhythm. °PROCEDURE °· You will be given medicine through an IV tube to reduce discomfort and make you sleepy (sedative).   °· An electrical shock will be delivered. °AFTER THE PROCEDURE °Your heart rhythm will be watched to make sure it does not change. You may be able to go home within a few hours.  °Document Released: 05/14/2002 Document Revised: 03/14/2013 Document Reviewed: 12/06/2012 °Exit[employer]® Patient Information ©2014 [employer]. ° °

## 2013-07-02 NOTE — H&P (View-Only) (Signed)
Patient ID: Anthony Cole, male   DOB: 10/30/1950, 63 y.o.   MRN: [id number] Medical History: elevated PSA, BPH and hypogonadism followed by urologist Dr Davis, Hypertension, benign, Hypercholesterolemia, Brachial neuritis, [place], Dr Lupton, Atrial fibrillation, 5/13 successful cardioversion then return of AFib.     [address] [place], [place]  [address] Phone: [phone] Fax:  [phone]  Date:  06/27/2013   ID:  Anthony Cole, DOB 11/23/1950, MRN [id number]  PCP:  ROSS,ALLAN, MD   ASSESSMENT:  1. Atrial flutter with controlled ventricular response documented on 06/27/2013 2. History of atrial fibrillation with prior electrical cardioversion 2014 3. Chronic anticoagulation therapy 4. Hypertension 5. Diabetes  PLAN:  1. Continue amiodarone 400 mg daily 2. Continue anticoagulation daily 3. Elective cardioversion as soon as possible. Indication has been rediscussed with the patient. Risks including death, stroke, and mechanical injury have been discussed. The patient understands that the electrical cardioversion is not a permanent solution for atrial flutter or a cure but simply a means to get the heart back in rhythm. After some considerable discussion, he has decided to proceed. 4. Discussed medical management versus ablative procedures. He is not in favor of an ablation approach at this time. SUBJECTIVE: [name] is a 63 y.o. male who has a history of atrial fibrillation and prior electrical cardioversion in 2013 (May). He has been on amiodarone therapy for suppression. He saw Dr. Bahnson at [facility] and management strategy was not changed. In December 2013 we decreased amiodarone to 100 mg per day. In April 2014 he redeveloped atrial fibrillation and we increased amiodarone dose to 200 mg per day. He spontaneously reverted back to normal sinus rhythm and remained in rhythm until 06/18/2013 when he noticed a rapid heart rate while  exercising. An EKG done at our office the next day demonstrated atrial fibrillation. I increased amiodarone dose to 200 mg twice a day. He returns today for management considerations. EKG today demonstrates atrial flutter with variable block. He is asymptomatic.   Wt Readings from Last 3 Encounters:  06/27/13 189 lb (85.73 kg)  03/28/13 188 lb (85.276 kg)  07/11/12 175 lb (79.379 kg)     Past Medical History  Diagnosis Date  . Diabetes mellitus without complication   . Hypertension   . Hyperlipidemia   . Atrial fibrillation     Current Outpatient Prescriptions  Medication Sig Dispense Refill  . amiodarone (PACERONE) 200 MG tablet Take 1 tablet (200 mg total) by mouth 2 (two) times daily.      . B Complex Vitamins (VITAMIN B COMPLEX PO) Take 1 tablet by mouth daily.      . cholecalciferol (VITAMIN D) 1000 UNITS tablet Take 1,000 Units by mouth every morning.      . doxycycline (VIBRA-TABS) 100 MG tablet Take 100 mg by mouth daily as needed (for rosacea on face).       . levothyroxine (SYNTHROID, LEVOTHROID) 50 MCG tablet Take 50 mcg by mouth daily.      . metoprolol succinate (TOPROL-XL) 50 MG 24 hr tablet Take 50 mg by mouth every evening. Take with or immediately following a meal.      . Multiple Vitamin (MULITIVITAMIN WITH MINERALS) TABS Take 1 tablet by mouth every morning.      . Rivaroxaban (XARELTO) 20 MG TABS tablet Take 1 tablet (20 mg total) by mouth daily.  30 tablet  5  . rosuvastatin (CRESTOR) 10 MG tablet Take 10 mg by mouth   every morning.       No current facility-administered medications for this visit.    Allergies:    Allergies  Allergen Reactions  . Gabapentin Other (See Comments)    Causes Extreme sedation  . Other     Not sure of name of med-for overactive bladder-caused some adverse reactions, stopped taking immediately-per patient  . Glucosamine Forte [Nutritional Supplements] Rash    Higher doses Causes Facial rash on face, lower doses are ok.      Social History:  The patient  reports that he has never smoked. He has never used smokeless tobacco. He reports that he does not drink alcohol or use illicit drugs.   ROS:  Please see the history of present illness.   No bleeding or neurological complaints. Decreased energy.  He has not had syncope. He denies angina. All other systems reviewed and negative.   OBJECTIVE: VS:  BP 134/82  Pulse 70  Ht 5' 9" (1.753 m)  Wt 189 lb (85.73 kg)  BMI 27.90 kg/m2 Well nourished, well developed, in no acute distress, but has gained weight. HEENT: normal Neck: JVD flat. Carotid bruit absent  Cardiac:  normal S1, S2; IIRR; no murmur Lungs:  clear to auscultation bilaterally, no wheezing, rhonchi or rales Abd: soft, nontender, no hepatomegaly Ext: Edema absent. Pulses 2+ bilateral Skin: warm and dry Neuro:  CNs 2-12 intact, no focal abnormalities noted  EKG:  Atrial flutter with controlled ventricular response at 70 beats per minute (typical flutter). This is a change from EKG done one week ago that revealed atrial fibrillation.     ECHOCARDIOGRAPHY: 2013 Conclusions: 1. There is mild aortic root dilatation. 2. Mild left atrial enlargement. 3. Left ventricular ejection fraction estimated by 2D at 50-55 percent. 4. There is moderate concentric left ventricle hypertrophy. 5. Trivial tricuspid regurgitation. 6. Trace mitral valve regurgitation. 7. Diastolic function difficult to evaluate in light of underlying atrial fibrillation. Signed, Henry W. B. Smith III, MD 06/27/2013 1:29 PM   

## 2013-07-02 NOTE — Anesthesia Preprocedure Evaluation (Addendum)
Anesthesia Evaluation  Patient identified by MRN, date of birth, ID band Patient awake    Reviewed: Allergy & Precautions, H&P , NPO status , Patient's Chart, lab work & pertinent test results, reviewed documented beta blocker date and time   Airway Mallampati: II TM Distance: >3 FB Neck ROM: Full    Dental  (+) Teeth Intact and Dental Advisory Given   Pulmonary          Cardiovascular hypertension, Pt. on medications + dysrhythmias Atrial Fibrillation     Neuro/Psych    GI/Hepatic   Endo/Other  diabetesPre diabetic  Renal/GU      Musculoskeletal   Abdominal   Peds  Hematology   Anesthesia Other Findings   Reproductive/Obstetrics                          Anesthesia Physical Anesthesia Plan  ASA: II  Anesthesia Plan: MAC and General   Post-op Pain Management:    Induction: Intravenous  Airway Management Planned: Mask  Additional Equipment:   Intra-op Plan:   Post-operative Plan:   Informed Consent: I have reviewed the patients History and Physical, chart, labs and discussed the procedure including the risks, benefits and alternatives for the proposed anesthesia with the patient or authorized representative who has indicated his/her understanding and acceptance.   Dental advisory given  Plan Discussed with: CRNA, Anesthesiologist and Surgeon  Anesthesia Plan Comments:        Anesthesia Quick Evaluation

## 2013-07-04 ENCOUNTER — Encounter (HOSPITAL_COMMUNITY): Payer: Self-pay | Admitting: Interventional Cardiology

## 2013-07-06 ENCOUNTER — Telehealth: Payer: Self-pay

## 2013-07-06 NOTE — Telephone Encounter (Signed)
Message copied by Jarvis NewcomerPARRIS-GODLEY, LISA S on Fri Jul 06, 2013 11:12 AM ------      Message from: Verdis PrimeSMITH, HENRY      Created: Mon Jul 02, 2013 10:18 AM       Needs 2 week OV with ECG. Update med list to reflect decrease amiodarone to 200 mg daily. ------

## 2013-07-06 NOTE — Telephone Encounter (Signed)
lmom for pt to return call.pt needs 2 wk post cardioversion appt with an EKG

## 2013-07-06 NOTE — Telephone Encounter (Signed)
Follow Up: ° °Pt is returning a call to Lisa °

## 2013-07-06 NOTE — Telephone Encounter (Signed)
returned pt call.pt appt made 07/16/13 @8 :45 for 2 wk pst dccv...pt verbalized understanding.

## 2013-07-16 ENCOUNTER — Encounter: Payer: Self-pay | Admitting: Interventional Cardiology

## 2013-07-16 ENCOUNTER — Ambulatory Visit (INDEPENDENT_AMBULATORY_CARE_PROVIDER_SITE_OTHER): Payer: BC Managed Care – PPO | Admitting: Interventional Cardiology

## 2013-07-16 VITALS — BP 130/72 | HR 51 | Ht 69.0 in | Wt 188.0 lb

## 2013-07-16 DIAGNOSIS — I10 Essential (primary) hypertension: Secondary | ICD-10-CM

## 2013-07-16 DIAGNOSIS — I4891 Unspecified atrial fibrillation: Secondary | ICD-10-CM

## 2013-07-16 DIAGNOSIS — I4892 Unspecified atrial flutter: Secondary | ICD-10-CM

## 2013-07-16 DIAGNOSIS — Z7901 Long term (current) use of anticoagulants: Secondary | ICD-10-CM

## 2013-07-16 DIAGNOSIS — I480 Paroxysmal atrial fibrillation: Secondary | ICD-10-CM

## 2013-07-16 DIAGNOSIS — Z79899 Other long term (current) drug therapy: Secondary | ICD-10-CM

## 2013-07-16 NOTE — Patient Instructions (Signed)
Your physician recommends that you continue on your current medications as directed. Please refer to the Current Medication list given to you today.  Your physician wants you to follow-up in: 6 months You will receive a reminder letter in the mail two months in advance. If you don't receive a letter, please call our office to schedule the follow-up appointment.  Your physician recommends that you return for lab work in: 6 months  Tsh, Lft, Chest xray

## 2013-07-16 NOTE — Progress Notes (Signed)
Patient ID: Anthony Cole, male   DOB: 1950-10-30, 63 y.o.   MRN: [id number]    [address] [place], [place]  [address] Phone: [phone] Fax:  [phone]  Date:  07/16/2013   ID:  Anthony Cole, DOB 29-Apr-1951, MRN [id number]  PCP:  Miguel Aschoff, MD   ASSESSMENT:  1. Paroxysmal atrial fibrillation, maintaining normal sinus rhythm and currently in sinus bradycardia 2. Chronic amiodarone therapy 3. Chronic anticoagulation therapy without complications  PLAN:  1. Six-month clinical followup with TSH, hepatic panel, and chest x-ray 2. Resume full activity 3. Amiodarone 200 mg daily   SUBJECTIVE: Anthony Cole is a 63 y.o. male is doing well after cardioversion. No irregular heart beat. No medication side effects. No transient symptoms of neurological abnormality   Wt Readings from Last 3 Encounters:  07/16/13 188 lb (85.276 kg)  07/02/13 189 lb (85.73 kg)  07/02/13 189 lb (85.73 kg)     Past Medical History  Diagnosis Date  . Diabetes mellitus without complication   . Hypertension   . Hyperlipidemia   . Atrial fibrillation     Current Outpatient Prescriptions  Medication Sig Dispense Refill  . amiodarone (PACERONE) 200 MG tablet Take 1 tablet (200 mg total) by mouth daily.      . B Complex Vitamins (VITAMIN B COMPLEX PO) Take 1 tablet by mouth daily.      . cholecalciferol (VITAMIN D) 1000 UNITS tablet Take 1,000 Units by mouth every morning.      [name] doxycycline (VIBRA-TABS) 100 MG tablet Take 100 mg by mouth daily as needed (for rosacea on face).       [name] levothyroxine (SYNTHROID, LEVOTHROID) 50 MCG tablet Take 50 mcg by mouth daily.      . metoprolol succinate (TOPROL-XL) 50 MG 24 hr tablet Take 50 mg by mouth every evening. Take with or immediately following a meal.      . Multiple Vitamin (MULITIVITAMIN WITH MINERALS) TABS Take 1 tablet by mouth every morning.      . Rivaroxaban (XARELTO) 20 MG TABS tablet Take 1 tablet (20 mg total) by mouth  daily.  30 tablet  5  . rosuvastatin (CRESTOR) 10 MG tablet Take 10 mg by mouth daily.        No current facility-administered medications for this visit.    Allergies:    Allergies  Allergen Reactions  . Gabapentin Other (See Comments)    Causes Extreme sedation  . Other     Not sure of name of med-for overactive bladder-caused some adverse reactions, stopped taking immediately-per patient  . Glucosamine Forte [Nutritional Supplements] Rash    Higher doses Causes Facial rash on face, lower doses are ok.     Social History:  The patient  reports that he has never smoked. He has never used smokeless tobacco. He reports that he does not drink alcohol or use illicit drugs.   ROS:  Please see the history of present illness.   No bleeding   All other systems reviewed and negative.   OBJECTIVE: VS:  BP 130/72  Pulse 51  Ht 5\' 9"  (1.753 m)  Wt 188 lb (85.276 kg)  BMI 27.75 kg/m2 Well nourished, well developed, in no acute distress, younger HEENT: normal Neck: JVD  flat. Carotid bruit 2+ and symmetric  Cardiac:  normal S1, S2; RRR; no murmur Lungs:  clear to auscultation bilaterally, no wheezing, rhonchi or rales Abd: soft, nontender, no hepatomegaly Ext: Edema absent. Pulses 2+ Skin:  warm and dry Neuro:  CNs 2-12 intact, no focal abnormalities noted  EKG:  Sinus bradycardia with nonspecific ST abnormality       Signed, Darci NeedleHenry W. B. Solash Tullo III, MD 07/16/2013 9:31 AM

## 2013-08-13 ENCOUNTER — Other Ambulatory Visit (INDEPENDENT_AMBULATORY_CARE_PROVIDER_SITE_OTHER): Payer: BC Managed Care – PPO | Admitting: *Deleted

## 2013-08-13 ENCOUNTER — Telehealth: Payer: Self-pay | Admitting: Interventional Cardiology

## 2013-08-13 DIAGNOSIS — Z79899 Other long term (current) drug therapy: Secondary | ICD-10-CM

## 2013-08-13 DIAGNOSIS — Z7901 Long term (current) use of anticoagulants: Secondary | ICD-10-CM

## 2013-08-13 DIAGNOSIS — I4891 Unspecified atrial fibrillation: Secondary | ICD-10-CM

## 2013-08-13 LAB — CBC
HEMATOCRIT: 41.6 % (ref 39.0–52.0)
HEMOGLOBIN: 14 g/dL (ref 13.0–17.0)
MCHC: 33.5 g/dL (ref 30.0–36.0)
MCV: 92.5 fl (ref 78.0–100.0)
PLATELETS: 244 10*3/uL (ref 150.0–400.0)
RBC: 4.5 Mil/uL (ref 4.22–5.81)
RDW: 13.4 % (ref 11.5–14.6)
WBC: 7.7 10*3/uL (ref 4.5–10.5)

## 2013-08-13 NOTE — Telephone Encounter (Signed)
New Problem:  Pt is asking when he is next chest xray is due? I informed the pt he is not scheduled for an xray... Pt states he would like help setting up his MyChart.

## 2013-08-14 LAB — TSH: TSH: 5.79 u[IU]/mL — ABNORMAL HIGH (ref 0.35–5.50)

## 2013-08-14 LAB — CREATININE, SERUM: Creatinine, Ser: 1.1 mg/dL (ref 0.4–1.5)

## 2013-08-14 NOTE — Telephone Encounter (Signed)
lmom.pt needs cxr in aug 2015, pt given mychart instructiions and activation on the last page of his o/v avs

## 2013-08-15 ENCOUNTER — Other Ambulatory Visit: Payer: BC Managed Care – PPO

## 2013-08-20 NOTE — Telephone Encounter (Signed)
pt aware of lab results and Dr.Smtih instructions.Labs for kidney function and hemoglobin is stable. Thyroid is mildly low repeat tsh in 67mo.lab appt made for 10/16/13.pt sts that he has a annual appt with his pcp the end of April and may have labs drawn there.if Tsh is drawn pt will call the office to let us know to rqst results.pt agreeable with plan

## 2013-09-11 ENCOUNTER — Telehealth: Payer: Self-pay | Admitting: Interventional Cardiology

## 2013-09-11 NOTE — Telephone Encounter (Signed)
New Message:  Pt states he is out of town and will not be in town until next week. Pt states he is on a pay phone and will not be able to be  called back.... Pt is c/o afib, SOB and dizzyness... Pt would like to speak to a nurse and be worked in to see Dr. Katrinka BlazingSmith when he gets into town next week.

## 2013-09-11 NOTE — Telephone Encounter (Signed)
Received cal from patient he stated he is out of state and is using a pay phone.Stated he thinks his heart is out of rhythm,he is tired,dizzy,sob.No chest pain.Stated he wanted appointment with Dr.Smith next week.Dr.Smith's schedule is full.Patient refused to schedule appointment with extender.Patient spoke to Misty StanleyLisa Dr.Smith's nurse she worked in patient with Dr.Smith Monday 09/17/13 at 12:00 noon.Patient advised to go to ER if needed.

## 2013-09-17 ENCOUNTER — Encounter: Payer: Self-pay | Admitting: Interventional Cardiology

## 2013-09-17 ENCOUNTER — Encounter: Payer: Self-pay | Admitting: Cardiology

## 2013-09-17 ENCOUNTER — Ambulatory Visit (INDEPENDENT_AMBULATORY_CARE_PROVIDER_SITE_OTHER): Payer: BC Managed Care – PPO | Admitting: Interventional Cardiology

## 2013-09-17 VITALS — BP 130/72 | HR 65 | Ht 69.5 in | Wt 185.0 lb

## 2013-09-17 DIAGNOSIS — I4891 Unspecified atrial fibrillation: Secondary | ICD-10-CM

## 2013-09-17 DIAGNOSIS — I480 Paroxysmal atrial fibrillation: Secondary | ICD-10-CM

## 2013-09-17 DIAGNOSIS — Z79899 Other long term (current) drug therapy: Secondary | ICD-10-CM

## 2013-09-17 DIAGNOSIS — I10 Essential (primary) hypertension: Secondary | ICD-10-CM

## 2013-09-17 DIAGNOSIS — Z7901 Long term (current) use of anticoagulants: Secondary | ICD-10-CM

## 2013-09-17 LAB — CBC WITH DIFFERENTIAL/PLATELET
BASOS ABS: 0 10*3/uL (ref 0.0–0.1)
Basophils Relative: 0.6 % (ref 0.0–3.0)
EOS PCT: 2 % (ref 0.0–5.0)
Eosinophils Absolute: 0.1 10*3/uL (ref 0.0–0.7)
HEMATOCRIT: 40.9 % (ref 39.0–52.0)
HEMOGLOBIN: 13.6 g/dL (ref 13.0–17.0)
Lymphocytes Relative: 23.4 % (ref 12.0–46.0)
Lymphs Abs: 1.7 10*3/uL (ref 0.7–4.0)
MCHC: 33.3 g/dL (ref 30.0–36.0)
MCV: 92.7 fl (ref 78.0–100.0)
MONO ABS: 0.9 10*3/uL (ref 0.1–1.0)
Monocytes Relative: 12.5 % — ABNORMAL HIGH (ref 3.0–12.0)
Neutro Abs: 4.5 10*3/uL (ref 1.4–7.7)
Neutrophils Relative %: 61.5 % (ref 43.0–77.0)
PLATELETS: 280 10*3/uL (ref 150.0–400.0)
RBC: 4.41 Mil/uL (ref 4.22–5.81)
RDW: 13.7 % (ref 11.5–14.6)
WBC: 7.3 10*3/uL (ref 4.5–10.5)

## 2013-09-17 LAB — BASIC METABOLIC PANEL
BUN: 20 mg/dL (ref 6–23)
CHLORIDE: 101 meq/L (ref 96–112)
CO2: 28 mEq/L (ref 19–32)
Calcium: 9.4 mg/dL (ref 8.4–10.5)
Creatinine, Ser: 1.2 mg/dL (ref 0.4–1.5)
GFR: 62.83 mL/min (ref >60.00)
Glucose, Bld: 98 mg/dL (ref 70–99)
Potassium: 4 mEq/L (ref 3.5–5.1)
Sodium: 139 mEq/L (ref 135–145)

## 2013-09-17 LAB — PROTIME-INR
INR: 2.2 ratio — ABNORMAL HIGH (ref 0.8–1.0)
Prothrombin Time: 23.3 s — ABNORMAL HIGH (ref 10.2–12.4)

## 2013-09-17 MED ORDER — AMIODARONE HCL 200 MG PO TABS: 200.0000 mg | ORAL_TABLET | Freq: Every day | ORAL | Status: DC

## 2013-09-17 MED ORDER — LEVOTHYROXINE SODIUM 50 MCG PO TABS: 50.0000 ug | ORAL_TABLET | Freq: Every day | ORAL | Status: DC

## 2013-09-17 MED ORDER — RIVAROXABAN 20 MG PO TABS: 20.0000 mg | ORAL_TABLET | Freq: Every day | ORAL | Status: DC

## 2013-09-17 NOTE — Patient Instructions (Addendum)
Your physician has recommended that you have a Cardioversion (DCCV). Electrical Cardioversion uses a jolt of electricity to your heart either through paddles or wired patches attached to your chest. This is a controlled, usually prescheduled, procedure. Defibrillation is done under light anesthesia in the hospital, and you usually go home the day of the procedure. This is done to get your heart back into a normal rhythm. You are not awake for the procedure. Please see the instruction sheet given to you today.  Your physician recommends that you return for lab work today for cbc with diff, pt/inr, and bmet.   Your physician recommends that you schedule a follow-up appointment in: 1 week post cardioversion.   Dr. Katrinka BlazingSmith recommends that you schedule an appt with [facility] for a consult regarding afib ablation.

## 2013-09-17 NOTE — Progress Notes (Signed)
Patient ID: Anthony SabinaRoderick B Broad, male   DOB: [birth year], 63 y.o.   MRN: [id number]    [address] [place], [place]  [id number] Phone: [phone]) [phone] Fax:  [phone]  Date:  09/17/2013   ID:  Anthony Cole, DOB [birth year], MRN [id number]  PCP:  Miguel AschoffOSS,ALLAN, MD   ASSESSMENT:  1. Recurrent atrial fibrillation, recurring during a trip to [place]. He has exertional fatigue but otherwise is doing relatively well. Last cardioversion January 2015 2. Chronic amiodarone therapy with excellent compliance 3. Chronic anticoagulation therapy, Xarelto 4. Amiodarone induced hypothyroidism  PLAN:  1. elective cardioversion within the next 48 hours 2. [facility] back [facility] for A. fib ablation 3. Continue anticoagulation therapy as noted 4. Refill medications as requested   SUBJECTIVE: Anthony Cole is a 63 y.o. male who has had 2 previous cardioversions, initially in May 2013 and subsequently in January 2015. His subsequent recurrences have occurred on amiodarone, initially 100 mg prior to the January 2015 cardioversion and now on 200 mg per day he has recurred. He is interested in maintaining normal sinus rhythm because he feels out of energy when in atrial fibrillation. He denies dyspnea. There is no chest pain. He does feel occasional pounding in his chest. He had previously considered ablation and saw [facility]. Even though a procedure was offered the patient decided for conservative medical management. He is now willing to consider ablation   Wt Readings from Last 3 Encounters:  09/17/13 185 lb (83.915 kg)  07/16/13 188 lb (85.276 kg)  07/02/13 189 lb (85.73 kg)     Past Medical History  Diagnosis Date  . Diabetes mellitus without complication   . Hypertension   . Hyperlipidemia   . Atrial fibrillation     Current Outpatient Prescriptions  Medication Sig Dispense Refill  . amiodarone  (PACERONE) 200 MG tablet Take 1 tablet (200 mg total) by mouth daily.      . B Complex Vitamins (VITAMIN B COMPLEX PO) Take 1 tablet by mouth daily.      . cholecalciferol (VITAMIN D) 1000 UNITS tablet Take 1,000 Units by mouth every morning.      [name]. doxycycline (VIBRA-TABS) 100 MG tablet Take 100 mg by mouth daily as needed (for rosacea on face).       [name]. levothyroxine (SYNTHROID, LEVOTHROID) 50 MCG tablet Take 50 mcg by mouth daily.      . metoprolol succinate (TOPROL-XL) 50 MG 24 hr tablet Take 50 mg by mouth every evening. Take with or immediately following a meal.      . Multiple Vitamin (MULITIVITAMIN WITH MINERALS) TABS Take 1 tablet by mouth every morning.      . Rivaroxaban (XARELTO) 20 MG TABS tablet Take 1 tablet (20 mg total) by mouth daily.  30 tablet  5  . rosuvastatin (CRESTOR) 10 MG tablet Take 10 mg by mouth daily.        No current facility-administered medications for this visit.    Allergies:    Allergies  Allergen Reactions  . Gabapentin Other (See Comments)    Causes Extreme sedation  . Other     Not sure of name of med-for overactive bladder-caused some adverse reactions, stopped taking immediately-per patient  . Glucosamine Forte [Nutritional Supplements] Rash    Higher doses Causes Facial rash on face, lower doses are ok.     Social History:  The patient  reports that  he has never smoked. He has never used smokeless tobacco. He reports that he does not drink alcohol or use illicit drugs.   ROS:  Please see the history of present illness.   No neurological complaints. He denies orthopnea, PND, lower extremity edema, and syncope. He does have occasional dizziness.   All other systems reviewed and negative.   OBJECTIVE: VS:  BP 130/72  Pulse 65  Ht 5' 9.5" (1.765 m)  Wt 185 lb (83.915 kg)  BMI 26.94 kg/m2 Well nourished, well developed, in no acute distress, well-developed well-nourished HEENT: normal Neck: JVD flat. Carotid bruit absent  Cardiac:  normal S1,  S2; IIRR; no murmur Lungs:  clear to auscultation bilaterally, no wheezing, rhonchi or rales Abd: soft, nontender, no hepatomegaly Ext: Edema absent. Pulses 2+ Skin: warm and dry Neuro:  CNs 2-12 intact, no focal abnormalities noted  EKG:  Atrial fibrillation with controlled ventricular response and nonspecific ST-T wave abnormality.       Signed, Darci NeedleHenry W. B. Kiondre Grenz III, MD 09/17/2013 11:36 AM

## 2013-09-19 ENCOUNTER — Encounter (HOSPITAL_COMMUNITY)
Admission: RE | Disposition: A | Discharge status: Home / Self Care | Payer: Self-pay | Source: Ambulatory Visit | Attending: Interventional Cardiology

## 2013-09-19 ENCOUNTER — Encounter (HOSPITAL_COMMUNITY): Payer: BC Managed Care – PPO | Admitting: Anesthesiology

## 2013-09-19 ENCOUNTER — Encounter (HOSPITAL_COMMUNITY): Payer: Self-pay | Admitting: *Deleted

## 2013-09-19 ENCOUNTER — Ambulatory Visit (HOSPITAL_COMMUNITY): Payer: BC Managed Care – PPO | Admitting: Anesthesiology

## 2013-09-19 ENCOUNTER — Ambulatory Visit (HOSPITAL_COMMUNITY)
Admission: RE | Admit: 2013-09-19 | Discharge: 2013-09-19 | Disposition: A | Discharge status: Home / Self Care | Payer: BC Managed Care – PPO | Source: Ambulatory Visit | Attending: Interventional Cardiology | Admitting: Interventional Cardiology

## 2013-09-19 DIAGNOSIS — I480 Paroxysmal atrial fibrillation: Secondary | ICD-10-CM

## 2013-09-19 DIAGNOSIS — Z79899 Other long term (current) drug therapy: Secondary | ICD-10-CM

## 2013-09-19 DIAGNOSIS — E119 Type 2 diabetes mellitus without complications: Secondary | ICD-10-CM | POA: Insufficient documentation

## 2013-09-19 DIAGNOSIS — Z7901 Long term (current) use of anticoagulants: Secondary | ICD-10-CM

## 2013-09-19 DIAGNOSIS — I4891 Unspecified atrial fibrillation: Secondary | ICD-10-CM | POA: Insufficient documentation

## 2013-09-19 DIAGNOSIS — I10 Essential (primary) hypertension: Secondary | ICD-10-CM | POA: Insufficient documentation

## 2013-09-19 HISTORY — PX: CARDIOVERSION: SHX1299

## 2013-09-19 HISTORY — DX: Hypothyroidism, unspecified: E03.9

## 2013-09-19 SURGERY — CARDIOVERSION
Anesthesia: Monitor Anesthesia Care

## 2013-09-19 MED ORDER — HYDROMORPHONE HCL PF 1 MG/ML IJ SOLN: 0.2500 mg | INTRAMUSCULAR | Status: DC | PRN

## 2013-09-19 MED ORDER — PROPOFOL 10 MG/ML IV BOLUS
INTRAVENOUS | Status: DC | PRN
  Administered 2013-09-19: 100 mg via INTRAVENOUS

## 2013-09-19 MED ORDER — SODIUM CHLORIDE 0.9 % IV SOLN
INTRAVENOUS | Status: DC | PRN
  Administered 2013-09-19: 12:00:00 via INTRAVENOUS

## 2013-09-19 MED ORDER — SODIUM CHLORIDE 0.9 % IJ SOLN: 3.0000 mL | INTRAMUSCULAR | Status: DC | PRN

## 2013-09-19 MED ORDER — ONDANSETRON HCL 4 MG/2ML IJ SOLN: 4.0000 mg | Freq: Once | INTRAMUSCULAR | Status: DC | PRN

## 2013-09-19 MED ORDER — SODIUM CHLORIDE 0.9 % IJ SOLN: 3.0000 mL | Freq: Two times a day (BID) | INTRAMUSCULAR | Status: DC

## 2013-09-19 MED ORDER — LIDOCAINE HCL (CARDIAC) 20 MG/ML IV SOLN
INTRAVENOUS | Status: DC | PRN
  Administered 2013-09-19: 60 mg via INTRAVENOUS

## 2013-09-19 MED ORDER — SODIUM CHLORIDE 0.9 % IV SOLN: 250.0000 mL | INTRAVENOUS | Status: DC

## 2013-09-19 NOTE — Anesthesia Postprocedure Evaluation (Signed)
Anesthesia Post Note  Patient: Anthony SabinaRoderick B Ellerson  Procedure(s) Performed: Procedure(s) (LRB): CARDIOVERSION (N/A)  Anesthesia type: MAC  Patient location: PACU  Post pain: Pain level controlled and Adequate analgesia  Post assessment: Post-op Vital signs reviewed, Patient's Cardiovascular Status Stable and Respiratory Function Stable  Last Vitals:  Filed Vitals:   09/19/13 1243  BP: 156/78  Pulse: 49  Temp:   Resp: 19    Post vital signs: Reviewed and stable  Level of consciousness: awake, alert  and oriented  Complications: No apparent anesthesia complications

## 2013-09-19 NOTE — Anesthesia Preprocedure Evaluation (Addendum)
Anesthesia Evaluation  Patient identified by MRN, date of birth, ID band Patient awake and Patient confused    Reviewed: Allergy & Precautions, H&P , Patient's Chart, lab work & pertinent test results  Airway       Dental   Pulmonary          Cardiovascular hypertension,     Neuro/Psych    GI/Hepatic   Endo/Other  diabetes  Renal/GU      Musculoskeletal   Abdominal   Peds  Hematology   Anesthesia Other Findings   Reproductive/Obstetrics                          Anesthesia Physical Anesthesia Plan  ASA: III  Anesthesia Plan: MAC   Post-op Pain Management:    Induction: Intravenous  Airway Management Planned:   Additional Equipment:   Intra-op Plan:   Post-operative Plan:   Informed Consent:   Plan Discussed with:   Anesthesia Plan Comments:         Anesthesia Quick Evaluation

## 2013-09-19 NOTE — CV Procedure (Signed)
Electrical Cardioversion Procedure Note Anthony Cole [id number] [birth year]  Procedure: Electrical Cardioversion Indications:  Atrial Fibrillation  Time Out: Verified patient identification, verified procedure,medications/allergies/relevent history reviewed, required imaging and test results available.  Performed  Procedure Details  The patient was NPO after midnight. Anesthesia was administered at the beside  by Dr.H. Katrinka BlazingSmith with 100 mg of propofol and 60 mg lidocaine.  Cardioversion was done with synchronized biphasic defibrillation with AP pads with 200 watts.  The patient converted to normal sinus rhythm. The patient tolerated the procedure well .  IMPRESSION:  Successful cardioversion of atrial fibrillation to NSR /SB.    Anthony RecordsHenry W Justiss Gerbino Cole 09/19/2013, 12:30 PM

## 2013-09-19 NOTE — Transfer of Care (Signed)
Immediate Anesthesia Transfer of Care Note  Patient: Anthony Cole  Procedure(s) Performed: Procedure(s): CARDIOVERSION (N/A)  Patient Location: PACU  Anesthesia Type:MAC  Level of Consciousness: awake  Airway & Oxygen Therapy: Patient Spontanous Breathing and Patient connected to nasal cannula oxygen  Post-op Assessment: Report given to PACU RN and Post -op Vital signs reviewed and stable  Post vital signs: Reviewed and stable  Complications: No apparent anesthesia complications

## 2013-09-20 ENCOUNTER — Encounter (HOSPITAL_COMMUNITY): Payer: Self-pay | Admitting: Interventional Cardiology

## 2013-09-26 ENCOUNTER — Telehealth: Payer: Self-pay | Admitting: Interventional Cardiology

## 2013-09-26 DIAGNOSIS — I4892 Unspecified atrial flutter: Secondary | ICD-10-CM

## 2013-09-26 NOTE — Telephone Encounter (Signed)
New message      In oct 2014 Dr Katrinka BlazingSmith [facility].  Pt called today to schedule an appt today,but they said he needed to get a new referral.  I asked pt why he did not go and he said "he did not feel comfortable discussing that with me". Please call pt and let him know if Dr Katrinka BlazingSmith will give him a new referral.

## 2013-09-27 NOTE — Telephone Encounter (Signed)
returned pt call.lmom per Dr.Smith ok to do another ref for sleep study

## 2013-10-01 ENCOUNTER — Encounter: Payer: Self-pay | Admitting: Physician Assistant

## 2013-10-01 ENCOUNTER — Ambulatory Visit (INDEPENDENT_AMBULATORY_CARE_PROVIDER_SITE_OTHER): Payer: BC Managed Care – PPO | Admitting: Physician Assistant

## 2013-10-01 VITALS — BP 120/70 | HR 55 | Ht 69.0 in | Wt 185.0 lb

## 2013-10-01 DIAGNOSIS — Z7901 Long term (current) use of anticoagulants: Secondary | ICD-10-CM

## 2013-10-01 DIAGNOSIS — I4891 Unspecified atrial fibrillation: Secondary | ICD-10-CM

## 2013-10-01 DIAGNOSIS — I10 Essential (primary) hypertension: Secondary | ICD-10-CM

## 2013-10-01 DIAGNOSIS — I480 Paroxysmal atrial fibrillation: Secondary | ICD-10-CM

## 2013-10-01 NOTE — Assessment & Plan Note (Signed)
Patient maintaining normal sinus rhythm after cardioversion on 09/19/13. [facility]. He has an appointment male evidence. Followup with Dr. Katrinka BlazingSmith in 2 months. He is to have a sleep study before that.

## 2013-10-01 NOTE — Assessment & Plan Note (Signed)
Stable

## 2013-10-01 NOTE — Progress Notes (Signed)
HPI:   This is a 63 year old male patient of Dr. Katrinka Blazing who just underwent elective DC cardioversion for recurrent atrial fibrillation on amiodarone. He converted to normal sinus rhythm. He has been referred back to Dr. Macon Large at [facility] for her atrial fibrillation ablation. He remains on Xarelto.  Patient comes here for post cardioversion followup. He has had no recurrent atrial fibrillation and is overall feeling well. He is multiple questions concerning risks of possible ablation.  Allergies -- Gabapentin -- Other (See Comments)   --  Causes Extreme sedation  -- Other    --  Not sure of name of med-for overactive            bladder-caused some adverse reactions, stopped            taking immediately-per patient  -- Glucosamine Forte [Nutritional Supplements] -- Rash   --  Higher doses Causes Facial rash on face, lower            doses are ok.  Current Outpatient Prescriptions on File Prior to Visit: amiodarone (PACERONE) 200 MG tablet, Take 1 tablet (200 mg total) by mouth daily., Disp: 30 tablet, Rfl: 11 B Complex Vitamins (VITAMIN B COMPLEX PO), Take 1 tablet by mouth daily., Disp: , Rfl:  cholecalciferol (VITAMIN D) 1000 UNITS tablet, Take 1,000 Units by mouth every morning., Disp: , Rfl:  doxycycline (VIBRA-TABS) 100 MG tablet, Take 100 mg by mouth daily as needed (for rosacea on face). , Disp: , Rfl:  metoprolol succinate (TOPROL-XL) 50 MG 24 hr tablet, Take 50 mg by mouth every evening. Take with or immediately following a meal., Disp: , Rfl:  Multiple Vitamin (MULITIVITAMIN WITH MINERALS) TABS, Take 1 tablet by mouth every morning., Disp: , Rfl:  rivaroxaban (XARELTO) 20 MG TABS tablet, Take 1 tablet (20 mg total) by mouth daily., Disp: 90 tablet, Rfl: 3 rosuvastatin (CRESTOR) 10 MG tablet, Take 10 mg by mouth daily. , Disp: , Rfl:   No current facility-administered medications on file prior to visit.   Past Medical History:   Diabetes mellitus without complication                        Hypertension                                                 Hyperlipidemia                                               Atrial fibrillation                                          Hypothyroidism                                              Past Surgical History:   CARDIOVERSION  11/04/2011      Comment:Procedure: CARDIOVERSION;  Surgeon: Lesleigh NoeHenry W               Smith III, MD;  [facility];  Service:               Cardiovascular;  Laterality: N/A;   CARDIOVERSION                                   N/A 07/02/2013      Comment:Procedure: CARDIOVERSION;  Surgeon: Lesleigh NoeHenry W               Smith III, MD;  [facility];                Service: Cardiovascular;  Laterality: N/A;                10:07 elective cardioversion Lido 40mg ,               Propofol 60mg ,IV...200 joules, synched, pt               presently in A flutter,...cardioverted to               NSR...   STAPEDES SURGERY                                Right              ROTATOR CUFF REPAIR                             Bilateral              KNEE ARTHROSCOPY W/ DEBRIDEMENT                 Bilateral              TONSILLECTOMY                                                 FINGER SURGERY                                  Left              CARDIOVERSION                                   N/A 09/19/2013      Comment:Procedure: CARDIOVERSION;  Surgeon: Lesleigh NoeHenry W               Smith III, MD;  [facility];                Service: Cardiovascular;  Laterality: N/A;  Review of patient's family history indicates:   Hypertension                   Mother                   Stroke  Maternal Grandmother     Social History   Marital Status: Single              Spouse Name:                      Years of Education:                 Number of children:             Occupational History   None on file  Social History Main Topics   Smoking Status: Never  Smoker                     Smokeless Status: Never Used                       Alcohol Use: No             Drug Use: No             Sexual Activity: Not on file        Other Topics            Concern   None on file  Social History Narrative   None on file    ROS: See history of present illness otherwise negative   PHYSICAL EXAM: Well-nournished, in no acute distress. Neck: No JVD, HJR, Bruit, or thyroid enlargement  Lungs: No tachypnea, clear without wheezing, rales, or rhonchi  Cardiovascular: RRR, PMI not displaced, heart sounds normal, no murmurs, gallops, bruit, thrill, or heave.  Abdomen: BS normal. Soft without organomegaly, masses, lesions or tenderness.  Extremities: without cyanosis, clubbing or edema. Good distal pulses bilateral  SKin: Warm, no lesions or rashes   Musculoskeletal: No deformities  Neuro: no focal signs  BP 120/70  Pulse 55  Ht 5\' 9"  (1.753 m)  Wt 185 lb (83.915 kg)  BMI 27.31 kg/m2   EKG: Sinus bradycardia with nonspecific ST-T wave changes

## 2013-10-01 NOTE — Patient Instructions (Signed)
Your physician recommends that you schedule a follow-up appointment in: 2 MONTHS WITH DR. Katrinka BlazingSMITH  Your physician recommends that you continue on your current medications as directed. Please refer to the Current Medication list given to you today.

## 2013-10-01 NOTE — Assessment & Plan Note (Signed)
Continue Xarelto 

## 2013-10-08 ENCOUNTER — Telehealth: Payer: Self-pay | Admitting: Interventional Cardiology

## 2013-10-08 NOTE — Telephone Encounter (Signed)
New Message:  Pt states Misty StanleyLisa was helping him set up an appt with the [facility] [facility].

## 2013-10-08 NOTE — Telephone Encounter (Signed)
returned pt call.adv him that the are still working on his appt for his sleep study @ [facility].adv him that one of the schedulers will call him with info once appt is made.pt verbalized understanding.

## 2013-10-16 ENCOUNTER — Other Ambulatory Visit (INDEPENDENT_AMBULATORY_CARE_PROVIDER_SITE_OTHER): Payer: BC Managed Care – PPO

## 2013-10-16 DIAGNOSIS — Z79899 Other long term (current) drug therapy: Secondary | ICD-10-CM

## 2013-10-16 DIAGNOSIS — Z7901 Long term (current) use of anticoagulants: Secondary | ICD-10-CM

## 2013-10-16 LAB — TSH: TSH: 2.42 u[IU]/mL (ref 0.35–4.50)

## 2013-10-16 LAB — HEPATIC FUNCTION PANEL
ALK PHOS: 51 U/L (ref 39–117)
ALT: 52 U/L (ref 0–53)
AST: 34 U/L (ref 0–37)
Albumin: 3.8 g/dL (ref 3.5–5.2)
Bilirubin, Direct: 0 mg/dL (ref 0.0–0.3)
TOTAL PROTEIN: 6.5 g/dL (ref 6.0–8.3)
Total Bilirubin: 0.7 mg/dL (ref 0.2–1.2)

## 2013-10-18 ENCOUNTER — Telehealth: Payer: Self-pay | Admitting: Interventional Cardiology

## 2013-10-18 DIAGNOSIS — I4891 Unspecified atrial fibrillation: Secondary | ICD-10-CM

## 2013-10-18 DIAGNOSIS — R0789 Other chest pain: Secondary | ICD-10-CM

## 2013-10-18 DIAGNOSIS — I2510 Atherosclerotic heart disease of native coronary artery without angina pectoris: Secondary | ICD-10-CM

## 2013-10-18 MED ORDER — DILTIAZEM HCL 30 MG PO TABS: 30.0000 mg | ORAL_TABLET | Freq: Four times a day (QID) | ORAL | Status: DC | PRN

## 2013-10-18 NOTE — Telephone Encounter (Signed)
New Message  Pt called states that he was sent to [facility] whom states that he will need a stress test before recieving the new medication. Please call back to discuss.

## 2013-10-18 NOTE — Telephone Encounter (Signed)
returned pt call.pt given lab results...normal and verbalized understanding.[facility] for a possible ablation.pt sts that Amidarone was d/c by Dr.Bahnson, with the possibility of starting tikosyn,sotalol...with a 3 day hospitiliaztion.pt was given Diltiazem 30mg  4x daily prn for afib w/heartrate above 100bpm.pt sts that Dr.Bahnson is rqst pt have a stress test at Dr.Smith office.pt not completely sure what type of stress test.adv pt that Dr.Smith will return to The office on Monday.I will discuss with him and we will contact Dr.Bahnson office to see what they are requiring.pt agreeable with plan and verbalized understanding.

## 2013-10-23 NOTE — Telephone Encounter (Signed)
New problem     Pt called to report that Dr Macon LargeBahnson Would like pt to have a GXT.  Please give him a call back if any questions.

## 2013-10-23 NOTE — Telephone Encounter (Signed)
He was referred for ablation but has chosen further med therapy. Dr. Macon LargeBahnson recommends Flecainide but feels Stress cardiolite needs to be done before the medication can be used. Will order stress cardiolite: Dx. [phone]; [phone]; [phone]

## 2013-10-23 NOTE — Telephone Encounter (Signed)
returned pt call.pt adv that Dr.Bahnson would like him to have a nuclear stress test.a copy of his consultation was received and reviewed by Dr.Smith. pt given pre stress test instructions.pt adv that a scheduler will call him with a date and time for his stress test.pt verbalized understanding.

## 2013-11-02 ENCOUNTER — Ambulatory Visit (HOSPITAL_COMMUNITY): Payer: BC Managed Care – PPO | Attending: Cardiology | Admitting: Radiology

## 2013-11-02 VITALS — BP 144/86 | HR 54 | Ht 69.0 in | Wt 187.0 lb

## 2013-11-02 DIAGNOSIS — I2510 Atherosclerotic heart disease of native coronary artery without angina pectoris: Secondary | ICD-10-CM

## 2013-11-02 DIAGNOSIS — I4891 Unspecified atrial fibrillation: Secondary | ICD-10-CM

## 2013-11-02 DIAGNOSIS — E119 Type 2 diabetes mellitus without complications: Secondary | ICD-10-CM

## 2013-11-02 DIAGNOSIS — R5381 Other malaise: Secondary | ICD-10-CM | POA: Insufficient documentation

## 2013-11-02 DIAGNOSIS — R5383 Other fatigue: Principal | ICD-10-CM

## 2013-11-02 DIAGNOSIS — R0789 Other chest pain: Secondary | ICD-10-CM

## 2013-11-02 MED ORDER — REGADENOSON 0.4 MG/5ML IV SOLN
0.4000 mg | Freq: Once | INTRAVENOUS | Status: AC
  Administered 2013-11-02: 0.4 mg via INTRAVENOUS

## 2013-11-02 MED ORDER — TECHNETIUM TC 99M SESTAMIBI GENERIC - CARDIOLITE
33.0000 | Freq: Once | INTRAVENOUS | Status: AC | PRN
  Administered 2013-11-02: 33 via INTRAVENOUS

## 2013-11-02 MED ORDER — TECHNETIUM TC 99M SESTAMIBI GENERIC - CARDIOLITE
10.8000 | Freq: Once | INTRAVENOUS | Status: AC | PRN
  Administered 2013-11-02: 11 via INTRAVENOUS

## 2013-11-02 NOTE — Progress Notes (Signed)
[facility] SITE 3 NUCLEAR MED [address] [place], [place] [address] [phone]    Cardiology Nuclear Med Study  Anthony Cole is a 63 y.o. male     MRN : [id number]     DOB: 19-Sep-1950  Procedure Date: 11/02/2013  Nuclear Med Background Indication for Stress Test:  Evaluation for Ischemia History:  no prior hx CAD; A-fib; 2013 Echo-EF 50-55% Cardiac Risk Factors: Hypertension, Lipids and NIDDM  Symptoms:  Fatigue   Nuclear Pre-Procedure Caffeine/Decaff Intake:  None NPO After: 5:00pm   Lungs:  clear O2 Sat: 96% on room air. IV 0.9% NS with Angio Cath:  22g  IV Site: R Hand  IV Started by:  Doyne Keel, CNMT  Chest Size (in):  42 Cup Size: n/a  Height: 5\' 9"  (1.753 m)  Weight:  187 lb (84.823 kg)  BMI:  Body mass index is 27.6 kg/(m^2). Tech Comments:  Took Toprol last pm    Nuclear Med Study 1 or 2 day study: 1 day  Stress Test Type:  Treadmill/Lexiscan  Reading MD: Kristeen Miss, MD  Order Authorizing Provider:  Mendel Ryder, MD  Resting Radionuclide: Technetium 59m Sestamibi  Resting Radionuclide Dose: 11.0 mCi   Stress Radionuclide:  Technetium 54m Sestamibi  Stress Radionuclide Dose: 33.0 mCi           Stress Protocol Rest HR: 54 Stress HR: 95  Rest BP: 144/86 Stress BP: 159/84  Exercise Time (min): n/a METS: n/a           Dose of Adenosine (mg):  n/a Dose of Lexiscan: 0.4 mg  Dose of Atropine (mg): n/a Dose of Dobutamine: n/a mcg/kg/min (at max HR)  Stress Test Technologist: Nelson Chimes, BS-ES  Nuclear Technologist:  Domenic Polite, CNMT     Rest Procedure:  Myocardial perfusion imaging was performed at rest 45 minutes following the intravenous administration of Technetium 18m Sestamibi. Rest ECG: NSR with non-specific ST-T wave changes  Stress Procedure:  The patient received IV Lexiscan 0.4 mg over 15-seconds with concurrent low level exercise and then Technetium 27m Sestamibi was injected at 30-seconds while the patient continued  walking one more minute.  Quantitative spect images were obtained after a 45-minute delay.  Attempted to walk patient on Bruce Protocol.  He became SOB and had leg fatigue before reaching THR.  Changed to Low Level Lexican.  During the infusion the patient complained of fatigue and chest discomfort.  These symptoms resolved in recovery.  Stress ECG: No significant ST segment change suggestive of ischemia.  QPS Raw Data Images:  not available Stress Images:  Normal homogeneous uptake in all areas of the myocardium. Rest Images:  Normal homogeneous uptake in all areas of the myocardium. Subtraction (SDS):  No evidence of ischemia. Transient Ischemic Dilatation (Normal <1.22):  1.02 Lung/Heart Ratio (Normal <0.45):  0.32  Quantitative Gated Spect Images QGS EDV:  117 ml QGS ESV:  45 ml  Impression Exercise Capacity:  Lexiscan with low level exercise. BP Response:  Normal blood pressure response. Clinical Symptoms:  No significant symptoms noted. ECG Impression:  No significant ST segment change suggestive of ischemia. Comparison with Prior Nuclear Study: No images to compare  Overall Impression:  Normal stress nuclear study.  No evidence of ischemia.  Normal LV function   LV Ejection Fraction: 62%.  LV Wall Motion:  NL LV Function; NL Wall Motion.   Vesta Mixer, Montez Hageman., MD, [facility] 11/02/2013, 4:10 PM [address]  [address] Office -  [phone] Pager [phone]

## 2013-11-08 ENCOUNTER — Telehealth: Payer: Self-pay | Admitting: Interventional Cardiology

## 2013-11-08 NOTE — Telephone Encounter (Signed)
returned pt call pt given myoview results.Nuclear is low risk/normal, so med will be safe. Copy to Dr. Macon Large at [facility].pt will pick up a copy for his on records.pt adv a copy will be lefta at front desk for pick up.pt verbalized understanding.

## 2013-11-08 NOTE — Telephone Encounter (Signed)
New message    Patient would like to know the results of nuclear stress test done on last Friday.

## 2013-11-28 ENCOUNTER — Ambulatory Visit: Payer: BC Managed Care – PPO | Admitting: Interventional Cardiology

## 2013-12-12 ENCOUNTER — Encounter: Payer: Self-pay | Admitting: Interventional Cardiology

## 2013-12-12 ENCOUNTER — Ambulatory Visit (INDEPENDENT_AMBULATORY_CARE_PROVIDER_SITE_OTHER): Payer: BC Managed Care – PPO | Admitting: Interventional Cardiology

## 2013-12-12 VITALS — BP 142/82 | HR 60 | Ht 69.0 in | Wt 184.6 lb

## 2013-12-12 DIAGNOSIS — Z7901 Long term (current) use of anticoagulants: Secondary | ICD-10-CM

## 2013-12-12 DIAGNOSIS — E038 Other specified hypothyroidism: Secondary | ICD-10-CM

## 2013-12-12 DIAGNOSIS — E032 Hypothyroidism due to medicaments and other exogenous substances: Secondary | ICD-10-CM

## 2013-12-12 DIAGNOSIS — I480 Paroxysmal atrial fibrillation: Secondary | ICD-10-CM

## 2013-12-12 DIAGNOSIS — T50904A Poisoning by unspecified drugs, medicaments and biological substances, undetermined, initial encounter: Secondary | ICD-10-CM

## 2013-12-12 DIAGNOSIS — I4891 Unspecified atrial fibrillation: Secondary | ICD-10-CM

## 2013-12-12 DIAGNOSIS — Z79899 Other long term (current) drug therapy: Secondary | ICD-10-CM

## 2013-12-12 DIAGNOSIS — I10 Essential (primary) hypertension: Secondary | ICD-10-CM

## 2013-12-12 NOTE — Progress Notes (Signed)
Patient ID: Anthony Cole Cassity, male   DOB: December 02, 1950, 63 y.o.   MRN: [id number]    [address] [place], [place]  [id number] Phone: [phone]) [phone] Fax:  [phone]) [phone]  Date:  12/12/2013   ID:  Anthony Cole Bracken, DOB December 02, 1950, MRN [id number]  PCP:  Miguel AschoffOSS,ALLAN, MD   ASSESSMENT:  1. Atrial fibrillation, now on flecainide suppression therapy 2. Hypertension, not adequately controlled. We will need more aggressive therapy  3. Chronic anticoagulation 4. 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 5. Amiodarone therapy was discontinued on 10/09/2013. He is currently on flecainide   PLAN:  1. TSH level at the end of August. At that time we will determine if he needs to remain on thyroid replacement now he is off of amiodarone. 2. Have discussed in detail the patient needs to remain in contact with Dr. Macon LargeBahnson and a 7 months and to follow his instructions 3.  Clinical followup with me in 4 months 4. If blood pressures consistently run greater than 140/90 we will intensify antihypertensive regimen. He will monitor blood pressure as an outpatient.  Prolonged office visit do to excessive compulsive tendencies by the patient and multiple questions that predominantly covering information that we have covered in the past. Office visit lasting greater than 40 minutes.   SUBJECTIVE: Anthony Cole Liao is a 63 y.o. male who is under the care of Dr. Macon LargeBahnson for management of atrial fibrillation. He continues to have difficulty with anxiety concerning the atrial fibrillation. He has had 2 episodes of atrial fibrillation to occur since being on flecainide each of which has lasted less than 12 hours. He has no side effects to the current medical regimen. He has not had syncope. He denies chest pain. Low risk myocardial perfusion study. He does have moderate LVH on echo related to hypertension.   Wt Readings from Last 3 Encounters:  12/12/13 184 lb 9.6 oz (83.734 kg)  11/02/13 187 lb (84.823  kg)  10/01/13 185 lb (83.915 kg)     Past Medical History  Diagnosis Date  . Diabetes mellitus without complication   . Hypertension   . Hyperlipidemia   . Atrial fibrillation   . Hypothyroidism     Current Outpatient Prescriptions  Medication Sig Dispense Refill  . acyclovir (ZOVIRAX) 200 MG capsule       . Cole Complex Vitamins (VITAMIN Cole COMPLEX PO) Take 1 tablet by mouth daily.      . cholecalciferol (VITAMIN D) 1000 UNITS tablet Take 1,000 Units by mouth every morning.      . diltiazem (CARDIZEM) 30 MG tablet Take 1 tablet (30 mg total) by mouth 4 (four) times daily as needed. For Afib with heartrate above 100bpm      . doxycycline (VIBRA-TABS) 100 MG tablet Take 100 mg by mouth daily as needed (for rosacea on face).       . flecainide (TAMBOCOR) 100 MG tablet Take 100 mg by mouth 2 (two) times daily.      [name]. levothyroxine (SYNTHROID, LEVOTHROID) 50 MCG tablet Take 50 mcg by mouth daily. Taking 1.5 daily      . metoprolol tartrate (LOPRESSOR) 25 MG tablet Take 25 mg by mouth every morning. Taking 12.5mg  in the morning.      . Multiple Vitamin (MULITIVITAMIN WITH MINERALS) TABS Take 1 tablet by mouth every morning.      . rivaroxaban (XARELTO) 20 MG TABS tablet Take 1 tablet (20 mg total) by mouth daily.  90 tablet  3  . rosuvastatin (  CRESTOR) 10 MG tablet Take 10 mg by mouth daily.        No current facility-administered medications for this visit.    Allergies:    Allergies  Allergen Reactions  . Gabapentin Other (See Comments)    Causes Extreme sedation  . Other     Not sure of name of med-for overactive bladder-caused some adverse reactions, stopped taking immediately-per patient  . Glucosamine Forte [Nutritional Supplements] Rash    Higher doses Causes Facial rash on face, lower doses are ok.     Social History:  The patient  reports that he has never smoked. He has never used smokeless tobacco. He reports that he does not drink alcohol or use illicit drugs.   ROS:   Please see the history of present illness.   Appetite is stable. No neurological complaints. No bleeding complications. He denies chest pain. Apparently he had a very dense in information given to him by the electrophysiology physicians assistant versus the information and instructions given to him by Dr. Macon LargeBahnson. He seemed to be more attuned to the PAs recommendations and I discouraged that. He needs to follow Dr. Mont DuttonBahnson's requests and recommendation   All other systems reviewed and negative.   OBJECTIVE: VS:  BP 142/82  Pulse 60  Ht 5\' 9"  (1.753 m)  Wt 184 lb 9.6 oz (83.734 kg)  BMI 27.25 kg/m2 Well nourished, well developed, in no acute distress, anxious HEENT: normal Neck: JVD flat. Carotid bruit absent  Cardiac:  normal S1, S2; RRR; no murmur Lungs:  clear to auscultation bilaterally, no wheezing, rhonchi or rales Abd: soft, nontender, no hepatomegaly Ext: Edema absent. Pulses 2+ Skin: warm and dry Neuro:  CNs 2-12 intact, no focal abnormalities noted  EKG:  Not repeated       Signed, Darci NeedleHenry W. Cole. Nickie Warwick III, MD 12/12/2013 9:03 AM

## 2013-12-12 NOTE — Patient Instructions (Signed)
Your physician recommends that you continue on your current medications as directed. Please refer to the Current Medication list given to you today.  Your physician recommends that you return for lab work on 01/28/14 between 7:30am -5:15pm  Continue to monitor your bp at home   Your physician wants you to follow-up in: 4 months You will receive a reminder letter in the mail two months in advance. If you don't receive a letter, please call our office to schedule the follow-up appointment.

## 2014-01-28 ENCOUNTER — Other Ambulatory Visit (INDEPENDENT_AMBULATORY_CARE_PROVIDER_SITE_OTHER): Payer: BC Managed Care – PPO

## 2014-01-28 DIAGNOSIS — E038 Other specified hypothyroidism: Secondary | ICD-10-CM

## 2014-01-28 DIAGNOSIS — E032 Hypothyroidism due to medicaments and other exogenous substances: Secondary | ICD-10-CM

## 2014-01-28 DIAGNOSIS — T50904A Poisoning by unspecified drugs, medicaments and biological substances, undetermined, initial encounter: Secondary | ICD-10-CM

## 2014-01-28 LAB — TSH: TSH: 0.98 u[IU]/mL (ref 0.35–4.50)

## 2014-01-31 ENCOUNTER — Telehealth: Payer: Self-pay

## 2014-01-31 NOTE — Telephone Encounter (Signed)
lmom  lab tsh ok.

## 2014-01-31 NOTE — Telephone Encounter (Signed)
Message copied by Jarvis Newcomer on Thu Jan 31, 2014  1:54 PM ------      Message from: Verdis Prime      Created: Tue Jan 29, 2014  6:02 PM       ok ------

## 2014-02-19 ENCOUNTER — Telehealth: Payer: Self-pay

## 2014-02-19 DIAGNOSIS — G4733 Obstructive sleep apnea (adult) (pediatric): Secondary | ICD-10-CM

## 2014-02-19 NOTE — Telephone Encounter (Signed)
Pt walked in to the office requesting the results of his sleep study.pt was ref in April. Pt sts that he did have his sleep study @ [facility] in May and never heard anything back. There is nothing in epic that indicates that sleep study was completed. Adv pt we will f/u on his results and contact him once received. Pt verbalized understanding.

## 2014-02-20 NOTE — Telephone Encounter (Signed)
New Message  Pt called states that his sleep study was completed on may 7th 2015

## 2014-02-21 NOTE — Telephone Encounter (Signed)
To Misty Stanley to make aware.

## 2014-02-21 NOTE — Telephone Encounter (Signed)
Dr. Daphene Jaeger read his study

## 2014-02-21 NOTE — Telephone Encounter (Signed)
They are no longer a company. There is no way to get in touch with them... I would see if they sent them to pts PCP

## 2014-02-22 NOTE — Telephone Encounter (Signed)
Have Anthony Cole contact the people in [place] so that they can send the sleep study

## 2014-02-22 NOTE — Telephone Encounter (Signed)
This pt had a sleep study completed in May, but his results are not in epic. Pt walked in to the [address]  office on 9/15 requesting his sleep study results. It looks like it was read by Dr.Kelly. Could you guys please follow up with the pt, or provide the results so we may f/u. Thanks for  Your assistance

## 2014-02-25 NOTE — Telephone Encounter (Signed)
Called the sleep lab in [place] @ [phone]. Requested  Sleep study done in May.

## 2014-02-26 ENCOUNTER — Encounter: Payer: Self-pay | Admitting: Cardiovascular Disease

## 2014-02-26 NOTE — Telephone Encounter (Signed)
The sleep study has been received and scanned into Epic. If it has not been scanned into Dr. Michaelle Copas inbox, then per med rec it is also under encounters. Thanks.

## 2014-02-27 NOTE — Telephone Encounter (Signed)
pt aware sleep study positive for osa and pt will need a cpap titration.adv pt a scheduler  will call him to schedule. adv him that Dr.Turner will be following him for sleep and that they will call him with his sleep results going fwd. pt was thankful and verbalized understanding

## 2014-02-27 NOTE — Addendum Note (Signed)
Addended by: Jarvis Newcomer on: 02/27/2014 08:58 AM   Modules accepted: Orders

## 2014-03-12 ENCOUNTER — Telehealth: Payer: Self-pay | Admitting: Interventional Cardiology

## 2014-03-12 NOTE — Telephone Encounter (Signed)
New problem   Pt stated he was waiting on a copy of you sleep study results that was suppose to be mailed to him. And need an appt pertaining to results of sleep study.

## 2014-03-13 NOTE — Telephone Encounter (Signed)
Message fwd to scheduleing to f/u with pt for cpap titration appt. Message fwd to medical records to provide pt a copy of sleep study report

## 2014-03-14 NOTE — Telephone Encounter (Signed)
Returned pt call. Adv him that [facility] has his order for titration and will call him to schedule.pt provide sleep center tel #. Pt adv that medical records mailed a copy of his sleep study results, pt would like to come in to pick up a copy. Kim f/medical records aware and will leave it at the front desk for pick up

## 2014-03-14 NOTE — Telephone Encounter (Signed)
Follow up    Patient calling as a follow up copy of sleep study report. & appt .

## 2014-03-22 ENCOUNTER — Ambulatory Visit (HOSPITAL_BASED_OUTPATIENT_CLINIC_OR_DEPARTMENT_OTHER): Payer: BC Managed Care – PPO | Attending: Cardiology | Admitting: Radiology

## 2014-03-22 VITALS — Ht 70.0 in | Wt 190.0 lb

## 2014-03-22 DIAGNOSIS — G4733 Obstructive sleep apnea (adult) (pediatric): Secondary | ICD-10-CM | POA: Diagnosis not present

## 2014-03-22 DIAGNOSIS — G4710 Hypersomnia, unspecified: Secondary | ICD-10-CM | POA: Diagnosis present

## 2014-04-01 ENCOUNTER — Telehealth: Payer: Self-pay | Admitting: Cardiology

## 2014-04-01 NOTE — Addendum Note (Signed)
Addended by: Armanda MagicURNER, TRACI R on: 04/01/2014 12:30 PM   Modules accepted: Orders

## 2014-04-01 NOTE — Telephone Encounter (Signed)
Please let patient know that he had a successful CPAP titration and set patient up to see me in 10 weeks. He should get a call from [facility] in the next week to get set up with his device.

## 2014-04-01 NOTE — Sleep Study (Signed)
   NAME: Floy SabinaRoderick B Sada DATE OF BIRTH:  [birth year] MEDICAL RECORD NUMBER [id number]  LOCATION: [facility]  PHYSICIAN: TURNER,TRACI R  DATE OF STUDY: 03/22/2014  SLEEP STUDY TYPE:  CPAP titration               REFERRING PHYSICIAN: Quintella Reicherturner, Traci R, MD  INDICATION FOR STUDY: nonrestorative sleep and daytime hypersomnia and obstructive sleep apnea/hypopnea syndrome with AHI 19/hr  EPWORTH SLEEPINESS SCORE: 5 HEIGHT: 5\' 10"  (177.8 cm)  WEIGHT: 190 lb (86.183 kg)    Body mass index is 27.26 kg/(m^2).  NECK SIZE: 17 in. BMI:  27  MEDICATIONS: Reviewed as outlined in the chart  SLEEP ARCHITECTURE: The patient slept for a total of 215 minutes with no slow wave sleep and 32 hours of REM sleep.  Sleep efficiency was reduced at 56%.  Latency to sleep onset was 14 minutes and onset to REM was 65%.  RESPIRATORY DATA: There were a total of 3 apneas (2 central and 1 obstructive) and 6 obstructive hypopneas during sleep.  These all occurred during nonREM sleep and occurred mainly in the supine position.  The patient was started on CPAP at 5cm H2O and this was titrated for respiratory events to a final pressure of 8cm H2O.  The patient was able to sleep for a prolonged period of time in the supine position during REM sleep without any further respiratory events on 8cm H2O.  The AHI at 8cm H2O was 2 events per hour.  OXYGEN DATA: The Baseline O2 sat was 96% with the lowest O2 sat during REM slep of 93% and lowest during NREM sleep of 92%.    CARDIAC DATA: The patient maintained NSR with PAC's throughout the study  MOVEMENT/PARASOMNIA: There were increased periodic limb movements during the night with a increased PLM index of 25.9.  IMPRESSION/ RECOMMENDATION:   1.  Moderate Obstructive sleep apnea/hypopnea syndrome with successful CPAP titration to 8cm H2O. 2.  The patient will be set up on Resmed CPAP at 8cm H2O with medium [employer] Simplus FFM with heated humidifier  and C-flex of 3. 3.  The patient should be counseled on good sleep hygiene. 4.  The patient will be set up for followup CPAP visit in the office in 10 weeks.   Signed: Quintella ReichertURNER,TRACI R Diplomate, American Board of Sleep Medicine  ELECTRONICALLY SIGNED ON:  04/01/2014, 12:08 PM [facility] PH: [phone]   FX: [phone] ACCREDITED BY THE AMERICAN ACADEMY OF SLEEP MEDICINE

## 2014-04-03 ENCOUNTER — Telehealth: Payer: Self-pay | Admitting: Cardiology

## 2014-04-03 NOTE — Telephone Encounter (Signed)
Please call patient with sleep study results, also to talk about setting up an appointment with Dr.Turner. - KG

## 2014-04-03 NOTE — Telephone Encounter (Signed)
Addressed in 10/28 phone note.

## 2014-04-03 NOTE — Telephone Encounter (Signed)
Appointment scheduled for December 29 at 0930. Time and date verified with patient. Explained to patient that per Dr. Mayford Knifeurner, he had a successful CPAP titration. He should get a call from [facility] in the next week to get set up with his device. Instructed pt to call the office next week if he has not yet received a phone call.  Patient also asking to get a copy of his sleep study.  Routing to medical records to get this to patient.

## 2014-04-03 NOTE — Telephone Encounter (Signed)
Left message to call back  

## 2014-04-08 ENCOUNTER — Ambulatory Visit (INDEPENDENT_AMBULATORY_CARE_PROVIDER_SITE_OTHER): Payer: BC Managed Care – PPO | Admitting: Interventional Cardiology

## 2014-04-08 ENCOUNTER — Encounter: Payer: Self-pay | Admitting: Interventional Cardiology

## 2014-04-08 VITALS — BP 120/70 | HR 61 | Ht 70.0 in | Wt 186.1 lb

## 2014-04-08 DIAGNOSIS — G4733 Obstructive sleep apnea (adult) (pediatric): Secondary | ICD-10-CM

## 2014-04-08 DIAGNOSIS — I480 Paroxysmal atrial fibrillation: Secondary | ICD-10-CM

## 2014-04-08 DIAGNOSIS — Z7901 Long term (current) use of anticoagulants: Secondary | ICD-10-CM

## 2014-04-08 DIAGNOSIS — Z5181 Encounter for therapeutic drug level monitoring: Secondary | ICD-10-CM

## 2014-04-08 DIAGNOSIS — Z79899 Other long term (current) drug therapy: Secondary | ICD-10-CM

## 2014-04-08 NOTE — Progress Notes (Signed)
Patient ID: Anthony SabinaRoderick B Solt, male   DOB: 09/15/1950, 63 y.o.   MRN: [id number]    [address] [place], [place]  [address] Phone: [phone]([phone] Fax:  [phone]  Date:  04/08/2014   ID:  Anthony SabinaRoderick B Guilfoil, DOB 09/15/1950, MRN [id number]  PCP:  Miguel AschoffOSS,ALLAN, MD   ASSESSMENT:  1. Paroxysmal atrial fibrillation, controlled on flecainide 2. Patient complains of hair loss and implies that he could be related to flecainide 3. Blood pressure controlled 4. Chronic anticoagulation with Xarelto  PLAN:  1. Clinic follow-up in one year earlier if breakthrough atrial fib 2. I discouraged ablation 3. Medications unchanged   SUBJECTIVE: Anthony Cole is a 63 y.o. male who has multiple anxieties and questions concerning his management. He feels that flecainide is causes her to fall out. He has made up his mind to have ablation. He is worried about have an ablation in the complication profile. He is physically active. He's had no recurrence of atrial fibrillation since being on flexion. He denies syncope. He denies chest pain. He has multiple questions that had to be answered. We retraced prior conversations that we've had.   Wt Readings from Last 3 Encounters:  04/08/14 186 lb 1.9 oz (84.423 kg)  03/22/14 190 lb (86.183 kg)  12/12/13 184 lb 9.6 oz (83.734 kg)     Past Medical History  Diagnosis Date  . Diabetes mellitus without complication   . Hypertension   . Hyperlipidemia   . Atrial fibrillation   . Hypothyroidism     Current Outpatient Prescriptions  Medication Sig Dispense Refill  . acyclovir (ZOVIRAX) 200 MG capsule     . B Complex Vitamins (VITAMIN B COMPLEX PO) Take 1 tablet by mouth daily.    . cholecalciferol (VITAMIN D) 1000 UNITS tablet Take 1,000 Units by mouth every morning.    [name]. desonide (DESOWEN) 0.05 % lotion     . diltiazem (CARDIZEM) 30 MG tablet Take 1 tablet (30 mg total) by mouth 4 (four) times daily as needed. For Afib with heartrate above  100bpm    . doxycycline (VIBRA-TABS) 100 MG tablet Take 100 mg by mouth daily as needed (for rosacea on face).     . flecainide (TAMBOCOR) 100 MG tablet Take 100 mg by mouth 2 (two) times daily.    [name]. levothyroxine (SYNTHROID, LEVOTHROID) 50 MCG tablet Take 50 mcg by mouth daily. Taking 1.5 daily    . metoprolol succinate (TOPROL-XL) 25 MG 24 hr tablet     . metoprolol tartrate (LOPRESSOR) 25 MG tablet Take 25 mg by mouth every morning. Taking 12.5mg  in the morning.    . Multiple Vitamin (MULITIVITAMIN WITH MINERALS) TABS Take 1 tablet by mouth every morning.    . rivaroxaban (XARELTO) 20 MG TABS tablet Take 1 tablet (20 mg total) by mouth daily. 90 tablet 3  . rosuvastatin (CRESTOR) 10 MG tablet Take 10 mg by mouth daily.     Brynda Greathouse. SOOLANTRA 1 % CREA      No current facility-administered medications for this visit.    Allergies:    Allergies  Allergen Reactions  . Gabapentin Other (See Comments)    Causes Extreme sedation Causes Extreme sedation  . Other     Not sure of name of med-for overactive bladder-caused some adverse reactions, stopped taking immediately-per patient  . Glucosamine Rash  . Glucosamine Forte [Nutritional Supplements] Rash    Higher doses Causes Facial rash on face, lower doses are ok.  Social History:  The patient  reports that he has never smoked. He has never used smokeless tobacco. He reports that he does not drink alcohol or use illicit drugs.   ROS:  Please see the history of present illness.   No palpitations or syncope. Denies edema.   All other systems reviewed and negative.   OBJECTIVE: VS:  BP 120/70 mmHg  Pulse 61  Ht 5\' 10"  (1.778 m)  Wt 186 lb 1.9 oz (84.423 kg)  BMI 26.71 kg/m2  SpO2 97% Well nourished, well developed, in no acute distress, flat HEENT: normal Neck: JVD flat. Carotid bruit absent  Cardiac:  normal S1, S2; RRR; no murmur Lungs:  clear to auscultation bilaterally, no wheezing, rhonchi or rales Abd: soft, nontender, no  hepatomegaly Ext: Edema absent. Pulses 2+ Skin: warm and dry Neuro:  CNs 2-12 intact, no focal abnormalities noted  EKG:  Not performed       Signed, Darci NeedleHenry W. B. Smith III, MD 04/08/2014 10:23 AM

## 2014-04-08 NOTE — Patient Instructions (Signed)
Your physician recommends that you continue on your current medications as directed. Please refer to the Current Medication list given to you today.  Your physician wants you to follow-up in: 1 year with Dr.Smith You will receive a reminder letter in the mail two months in advance. If you don't receive a letter, please call our office to schedule the follow-up appointment.  

## 2014-04-19 ENCOUNTER — Telehealth: Payer: Self-pay | Admitting: Cardiology

## 2014-04-19 NOTE — Telephone Encounter (Signed)
New message    Patient was told to call back in two week if the medical company did not contact him regarding CPAP. Machine

## 2014-04-19 NOTE — Telephone Encounter (Signed)
Informed patient that another message has been sent to [facility] and he should be hearing from them ASAP.

## 2014-05-09 ENCOUNTER — Other Ambulatory Visit: Payer: Self-pay | Admitting: Specialist

## 2014-05-09 DIAGNOSIS — M542 Cervicalgia: Secondary | ICD-10-CM

## 2014-05-11 ENCOUNTER — Ambulatory Visit
Admission: RE | Admit: 2014-05-11 | Discharge: 2014-05-11 | Disposition: A | Discharge status: Home / Self Care | Payer: BC Managed Care – PPO | Source: Ambulatory Visit | Attending: Specialist | Admitting: Specialist

## 2014-05-11 DIAGNOSIS — M542 Cervicalgia: Secondary | ICD-10-CM

## 2014-05-20 ENCOUNTER — Telehealth: Payer: Self-pay | Admitting: Interventional Cardiology

## 2014-05-20 NOTE — Telephone Encounter (Signed)
Walk In pt Form " Questions about Meds" Please call gave to Lisa/KM

## 2014-05-21 ENCOUNTER — Encounter: Payer: Self-pay | Admitting: Cardiology

## 2014-05-23 ENCOUNTER — Telehealth: Payer: Self-pay | Admitting: Interventional Cardiology

## 2014-05-23 NOTE — Telephone Encounter (Signed)
New message      Followering up on a previous message regarding celebrex.  Please call

## 2014-05-28 ENCOUNTER — Encounter (HOSPITAL_BASED_OUTPATIENT_CLINIC_OR_DEPARTMENT_OTHER): Payer: BC Managed Care – PPO

## 2014-06-03 NOTE — Telephone Encounter (Signed)
Returned pt call. He reports that he was given an RX for Celebrex because of a strained muscle in his neck.adv him per Kennon RoundsSally pharm-d ok to take prn and with food. Pt sts that he did not have it filled and his neck is better. No further assistance needed

## 2014-06-04 ENCOUNTER — Ambulatory Visit: Payer: BC Managed Care – PPO | Admitting: Cardiology

## 2014-06-17 ENCOUNTER — Encounter: Payer: Self-pay | Admitting: Cardiology

## 2014-07-16 ENCOUNTER — Encounter: Payer: Self-pay | Admitting: Cardiology

## 2014-07-16 DIAGNOSIS — G4733 Obstructive sleep apnea (adult) (pediatric): Secondary | ICD-10-CM

## 2014-07-16 HISTORY — DX: Obstructive sleep apnea (adult) (pediatric): G47.33

## 2014-07-16 NOTE — Progress Notes (Signed)
This encounter was created in error - please disregard.

## 2014-07-17 ENCOUNTER — Encounter: Payer: Self-pay | Admitting: Cardiology

## 2014-07-17 ENCOUNTER — Encounter: Payer: BC Managed Care – PPO | Admitting: Cardiology

## 2014-07-30 ENCOUNTER — Ambulatory Visit (INDEPENDENT_AMBULATORY_CARE_PROVIDER_SITE_OTHER): Payer: BC Managed Care – PPO | Admitting: Cardiology

## 2014-07-30 ENCOUNTER — Ambulatory Visit: Payer: BC Managed Care – PPO | Admitting: Cardiology

## 2014-07-30 ENCOUNTER — Encounter: Payer: Self-pay | Admitting: Cardiology

## 2014-07-30 DIAGNOSIS — G4733 Obstructive sleep apnea (adult) (pediatric): Secondary | ICD-10-CM

## 2014-07-30 DIAGNOSIS — I10 Essential (primary) hypertension: Secondary | ICD-10-CM

## 2014-07-30 NOTE — Patient Instructions (Signed)
[facility] will contact you to change the settings on your CPAP and to supply you with a new mask and chin strap. After this, we will get another download 4 weeks later.  Your physician wants you to follow-up in: 6 months with Dr. Mayford Knifeurner. You will receive a reminder letter in the mail two months in advance. If you don't receive a letter, please call our office to schedule the follow-up appointment.

## 2014-07-30 NOTE — Progress Notes (Signed)
Cardiology Office Note   Date:  07/30/2014   ID:  Anthony SabinaRoderick B Speranza, DOB 04/14/1951, MRN [id number]  PCP:  Miguel AschoffOSS,ALLAN, MD  Cardiologist:   Quintella ReichertURNER,TRACI R, MD   No chief complaint on file.     History of Present Illness: Anthony Cole is a 64 y.o. male who presents for evaluation of sleep apnea.  The patient [facility] about excessive daytime sleepiness and nonrestorative sleep and excessive daytime sleepiness as well as PAF and underwent nocturnal PSG showing moderate OSA with an AHI of 19 events per hour.  He underwent CPAP titration to 8cm H2O and now presents for further evaluation.  He is doing well with his CPAP therapy.  He tolerates the CPAP.  He tolerates the full face mask well but is getting a rash on his face from the mask.  He is a mouth breather.  He  feels the pressure is adequate.  He feels rested in the am and has no daytime sleepiness.      Past Medical History  Diagnosis Date  . Diabetes mellitus without complication   . Hypertension   . Hyperlipidemia   . Atrial fibrillation   . Hypothyroidism   . OSA (obstructive sleep apnea) 07/16/2014    Moderate with AHI 19/hr on CPAP at 8cm H2O    Past Surgical History  Procedure Laterality Date  . Cardioversion  11/04/2011    Procedure: CARDIOVERSION;  Surgeon: Lesleigh NoeHenry W Smith III, MD;  [facility];  Service: Cardiovascular;  Laterality: N/A;  . Cardioversion N/A 07/02/2013    Procedure: CARDIOVERSION;  Surgeon: Lesleigh NoeHenry W Smith III, MD;  [facility];  Service: Cardiovascular;  Laterality: N/A;  10:07 elective cardioversion Lido 40mg , Propofol 60mg ,IV...200 joules, synched, pt presently in A flutter,...cardioverted to NSR...  . Stapedes surgery Right   . Rotator cuff repair Bilateral   . Knee arthroscopy w/ debridement Bilateral   . Tonsillectomy    . Finger surgery Left   . Cardioversion N/A 09/19/2013    Procedure: CARDIOVERSION;  Surgeon: Lesleigh NoeHenry W Smith III, MD;  [facility];  Service: Cardiovascular;  Laterality: N/A;     Current Outpatient Prescriptions  Medication Sig Dispense Refill  . acyclovir (ZOVIRAX) 200 MG capsule     . B Complex Vitamins (VITAMIN B COMPLEX PO) Take 1 tablet by mouth daily.    . cholecalciferol (VITAMIN D) 1000 UNITS tablet Take 1,000 Units by mouth every morning.    [name]. desonide (DESOWEN) 0.05 % lotion     . diltiazem (CARDIZEM) 30 MG tablet Take 1 tablet (30 mg total) by mouth 4 (four) times daily as needed. For Afib with heartrate above 100bpm    . doxycycline (VIBRA-TABS) 100 MG tablet Take 100 mg by mouth daily as needed (for rosacea on face).     . flecainide (TAMBOCOR) 100 MG tablet Take 100 mg by mouth 2 (two) times daily.    [name]. levothyroxine (SYNTHROID, LEVOTHROID) 50 MCG tablet Take 50 mcg by mouth daily. Taking 1.5 daily    . metoprolol tartrate (LOPRESSOR) 25 MG tablet Take 25 mg by mouth every morning. Taking 12.5mg  in the morning.    . Multiple Vitamin (MULITIVITAMIN WITH MINERALS) TABS Take 1 tablet by mouth every morning.    . rivaroxaban (XARELTO) 20 MG TABS tablet Take 1 tablet (20 mg total) by mouth daily. 90 tablet 3  . rosuvastatin (CRESTOR) 10 MG tablet Take 10 mg by mouth daily.     .[name]  SOOLANTRA 1 % CREA      No current facility-administered medications for this visit.    Allergies:   Gabapentin; Other; Glucosamine; and Glucosamine forte    Social History:  The patient  reports that he has never smoked. He has never used smokeless tobacco. He reports that he does not drink alcohol or use illicit drugs.   Family History:  The patient's family history includes Hypertension in his mother; Stroke in his maternal grandmother.    ROS:  Please see the history of present illness.   Otherwise, review of systems are positive for none.   All other systems are reviewed and negative.    PHYSICAL EXAM: VS:  BP 120/70 mmHg  Pulse 64  Ht  (1.778 m)  Wt 185 lb 12.8 oz (84.278 kg)  BMI 26.66 kg/m2  SpO2  97% , BMI Body mass index is 26.66 kg/(m^2). GEN: Well nourished, well developed, in no acute distress HEENT: normal Neck: no JVD, carotid bruits, or masses Cardiac: RRR; no murmurs, rubs, or gallops,no edema  Respiratory:  clear to auscultation bilaterally, normal work of breathing GI: soft, nontender, nondistended, + BS MS: no deformity or atrophy Skin: warm and dry, no rash Neuro:  Strength and sensation are intact Psych: euthymic mood, full affect   EKG:  EKG is not ordered today.    Recent Labs: 09/17/2013: BUN 20; Creatinine 1.2; Hemoglobin 13.6; Platelets 280.0; Potassium 4.0; Sodium 139 10/16/2013: ALT 52 01/28/2014: TSH 0.98    Lipid Panel No results found for: CHOL, TRIG, HDL, CHOLHDL, VLDL, LDLCALC, LDLDIRECT    Wt Readings from Last 3 Encounters:  07/30/14 185 lb 12.8 oz (84.278 kg)  07/17/14 187 lb 12.8 oz (85.186 kg)  04/08/14 186 lb 1.9 oz (84.423 kg)      ASSESSMENT AND PLAN:  1.  Moderate OSA with AHI 19/hr now on CPAP at 8cm H2O.  His download today showed an AHI of 5.8 events per hour on 8cm H2O.  He is 100% compliant in using more than 4 hours nightly.  His average usage is 8 hours and 23 minutes.  I have recommended increasing his CPAP to 9cm H2O and recheck D/L in 4 weeks.   2.  HTN- controlled   Current medicines are reviewed at length with the patient today.  The patient does not have concerns regarding medicines.  The following changes have been made:  no change  Labs/ tests ordered today include: None  No orders of the defined types were placed in this encounter.     Disposition:   FU with me in 6 months   Signed, Quintella Reichert, MD  07/30/2014 3:14 PM    [facility] [address] [place], [place], [place]  [address] Phone: [phone]; Fax: [phone]

## 2014-08-06 ENCOUNTER — Encounter: Payer: Self-pay | Admitting: Cardiology

## 2014-08-12 ENCOUNTER — Other Ambulatory Visit (INDEPENDENT_AMBULATORY_CARE_PROVIDER_SITE_OTHER): Payer: BC Managed Care – PPO | Admitting: *Deleted

## 2014-08-12 ENCOUNTER — Encounter: Payer: Self-pay | Admitting: Physician Assistant

## 2014-08-12 ENCOUNTER — Encounter: Payer: Self-pay | Admitting: Interventional Cardiology

## 2014-08-12 ENCOUNTER — Telehealth: Payer: Self-pay | Admitting: Interventional Cardiology

## 2014-08-12 ENCOUNTER — Encounter: Payer: Self-pay | Admitting: Cardiology

## 2014-08-12 ENCOUNTER — Ambulatory Visit (INDEPENDENT_AMBULATORY_CARE_PROVIDER_SITE_OTHER): Payer: BC Managed Care – PPO | Admitting: Physician Assistant

## 2014-08-12 ENCOUNTER — Other Ambulatory Visit: Payer: Self-pay | Admitting: Physician Assistant

## 2014-08-12 VITALS — BP 142/86 | HR 76 | Ht 70.0 in | Wt 186.1 lb

## 2014-08-12 DIAGNOSIS — I4892 Unspecified atrial flutter: Secondary | ICD-10-CM

## 2014-08-12 DIAGNOSIS — Z7901 Long term (current) use of anticoagulants: Secondary | ICD-10-CM

## 2014-08-12 DIAGNOSIS — I10 Essential (primary) hypertension: Secondary | ICD-10-CM

## 2014-08-12 LAB — BASIC METABOLIC PANEL
BUN: 23 mg/dL (ref 6–23)
CALCIUM: 9.3 mg/dL (ref 8.4–10.5)
CO2: 31 mEq/L (ref 19–32)
CREATININE: 0.88 mg/dL (ref 0.40–1.50)
Chloride: 102 mEq/L (ref 96–112)
GFR: 93.06 mL/min (ref >60.00)
Glucose, Bld: 155 mg/dL — ABNORMAL HIGH (ref 70–99)
Potassium: 4 mEq/L (ref 3.5–5.1)
Sodium: 138 mEq/L (ref 135–145)

## 2014-08-12 LAB — CBC
HCT: 43.1 % (ref 39.0–52.0)
HEMOGLOBIN: 14.7 g/dL (ref 13.0–17.0)
MCHC: 34.2 g/dL (ref 30.0–36.0)
MCV: 87.4 fl (ref 78.0–100.0)
PLATELETS: 260 10*3/uL (ref 150.0–400.0)
RBC: 4.93 Mil/uL (ref 4.22–5.81)
RDW: 13 % (ref 11.5–15.5)
WBC: 8.4 10*3/uL (ref 4.0–10.5)

## 2014-08-12 LAB — PROTIME-INR
INR: 1.5 ratio — AB (ref 0.8–1.0)
Prothrombin Time: 16.2 s — ABNORMAL HIGH (ref 9.6–13.1)

## 2014-08-12 LAB — APTT: APTT: 41.1 s — AB (ref 23.4–32.7)

## 2014-08-12 NOTE — Progress Notes (Signed)
Cardiology Office Note   Date:  08/12/2014   ID:  Anthony SabinaRoderick B Cole, DOB [birth year], MRN [id number]  PCP:  Miguel AschoffOSS,ALLAN, MD  Cardiologist: Verdis PrimeHenry Cole, M.D.  Chief Complaint: Atrial fibrillation    History of Present Illness: Anthony Cole is a 64 y.o. male who presents for recurrent atrial fibrillation. The patient has been on flecainide since last May. He has done well on flecainide and maintained normal sinus rhythm. He is [facility]wood [facility] for [facility] but has maintained sinus on flecainide so has not pursued this. On Friday he felt like he went back into atrial fibrillation and has been in it since. It is verified he is in atrial fibrillation/flutter on EKG with controlled ventricular rate. He has not missed any doses. He's had some trouble with his C Pap machine and was wondering if that had something to do with it. His mask was changed to a nasal cannula and his breathing hasn't been correct at night. Patient had normal Lexi scan Myoview in 10/2013. He is on Xarelto and his rate is controlled with diltiazem and Lopressor. He had been on amiodarone therapy in the past but this was discontinued in May 2015.   Past Medical History  Diagnosis Date  . Diabetes mellitus without complication   . Hypertension   . Hyperlipidemia   . Atrial fibrillation   . Hypothyroidism   . OSA (obstructive sleep apnea) 07/16/2014    Moderate with AHI 19/hr on CPAP at 8cm H2O    Past Surgical History  Procedure Laterality Date  . Cardioversion  11/04/2011    Procedure: CARDIOVERSION;  Surgeon: Lesleigh NoeHenry W Cole III, MD;  [facility];  Service: Cardiovascular;  Laterality: N/A;  . Cardioversion N/A 07/02/2013    Procedure: CARDIOVERSION;  Surgeon: Lesleigh NoeHenry W Cole III, MD;  [facility];  Service: Cardiovascular;  Laterality: N/A;  10:07 elective cardioversion Lido 40mg , Propofol 60mg ,IV...200 joules, synched, pt presently in A flutter,...cardioverted to NSR...  . Stapedes surgery  Right   . Rotator cuff repair Bilateral   . Knee arthroscopy w/ debridement Bilateral   . Tonsillectomy    . Finger surgery Left   . Cardioversion N/A 09/19/2013    Procedure: CARDIOVERSION;  Surgeon: Lesleigh NoeHenry W Cole III, MD;  [facility];  Service: Cardiovascular;  Laterality: N/A;     Current Outpatient Prescriptions  Medication Sig Dispense Refill  . acyclovir (ZOVIRAX) 200 MG capsule     . B Complex Vitamins (VITAMIN B COMPLEX PO) Take 1 tablet by mouth daily.    . cholecalciferol (VITAMIN D) 1000 UNITS tablet Take 1,000 Units by mouth every morning.    [name]. desonide (DESOWEN) 0.05 % lotion     . diltiazem (CARDIZEM) 30 MG tablet Take 1 tablet (30 mg total) by mouth 4 (four) times daily as needed. For Afib with heartrate above 100bpm    . doxycycline (VIBRA-TABS) 100 MG tablet Take 100 mg by mouth daily as needed (for rosacea on face).     . flecainide (TAMBOCOR) 100 MG tablet Take 100 mg by mouth 2 (two) times daily.    [name]. levothyroxine (SYNTHROID, LEVOTHROID) 50 MCG tablet Take 50 mcg by mouth daily. Taking 1.5 daily    . metoprolol tartrate (LOPRESSOR) 25 MG tablet Take 25 mg by mouth every morning. Taking 12.5mg  in the morning.    . Multiple Vitamin (MULITIVITAMIN WITH MINERALS) TABS Take 1 tablet by mouth every morning.    . rivaroxaban (XARELTO) 20 MG TABS tablet Take  1 tablet (20 mg total) by mouth daily. 90 tablet 3  . rosuvastatin (CRESTOR) 10 MG tablet Take 10 mg by mouth daily.     Anthony Cole. SOOLANTRA 1 % CREA      No current facility-administered medications for this visit.    Allergies:   Gabapentin; Other; Glucosamine; and Glucosamine forte    Social History:  The patient  reports that he has never smoked. He has never used smokeless tobacco. He reports that he does not drink alcohol or use illicit drugs.   Family History:  The patient's family history includes Hypertension in his mother; Stroke in his maternal grandmother.    ROS:  Please see the history of present  illness.   Otherwise, review of systems are positive for none.   All other systems are reviewed and negative.    PHYSICAL EXAM: VS:  BP 142/86 mmHg  Pulse 76  Ht 5\' 10"  (1.778 m)  Wt 186 lb 1.9 oz (84.423 kg)  BMI 26.71 kg/m2 , BMI Body mass index is 26.71 kg/(m^2). GEN: Well nourished, well developed, in no acute distress HEENT: normal Neck: no JVD, HJR, carotid bruits, or masses Cardiac: Irregular irregular; no gallop ,murmurs, rubs, thrill or heave,no edema,   Respiratory:  clear to auscultation bilaterally, normal work of breathing GI: soft, nontender, nondistended, + BS MS: no deformity or atrophy Extremities: without cyanosis, clubbing, edema, good distal pulses bilaterally.  Skin: warm and dry, no rash Neuro:  Strength and sensation are intact Psych: Extremely anxious   EKG:  EKG is ordered today. The ekg ordered today demonstrates atrial flutter at 76 bpm   Recent Labs: 09/17/2013: BUN 20; Creatinine 1.2; Hemoglobin 13.6; Platelets 280.0; Potassium 4.0; Sodium 139 10/16/2013: ALT 52 01/28/2014: TSH 0.98    Lipid Panel No results found for: CHOL, TRIG, HDL, CHOLHDL, VLDL, LDLCALC, LDLDIRECT    Wt Readings from Last 3 Encounters:  08/12/14 186 lb 1.9 oz (84.423 kg)  07/30/14 185 lb 12.8 oz (84.278 kg)  07/17/14 187 lb 12.8 oz (85.186 kg)      Other studies Reviewed: Additional studies/ records that were reviewed today include and review of the records demonstrates:  Impression Exercise Capacity:  Lexiscan with low level exercise. BP Response:  Normal blood pressure response. Clinical Symptoms:  No significant symptoms noted. ECG Impression:  No significant ST segment change suggestive of ischemia. Comparison with Prior Nuclear Study: No images to compare  Overall Impression:  Normal stress nuclear study.  No evidence of ischemia.  Normal LV function   LV Ejection Fraction: 62%.  LV Wall Motion:  NL LV Function; NL Wall Motion.   Vesta MixerPhilip J. Nahser, Montez HagemanJr., MD,  [facility] 11/02/2013, 4:10 PM [address]    ASSESSMENT AND PLAN:  Atrial flutter Recurrent atrial flutter on flecainide. I discussed this patient in detail with Dr. Johney FrameAllred who recommends cardioversion. We will set him up for tomorrow. He is on Xarelto. Follow-up with  Dr. Katrinka BlazingSmith.   Essential hypertension, benign BP stable   OSA (obstructive sleep apnea) C Pap mask was making his face red and he switched of the nasal cannula which doesn't seem to be as effective.   Long term current use of anticoagulant therapy Patient is on Xarelto.     Elson ClanSigned, Michele Lenze, PA-C  08/12/2014 12:37 PM    [facility] [address], [place], [place]  [address] Phone: [phone]; Fax: [phone]

## 2014-08-12 NOTE — Assessment & Plan Note (Signed)
Recurrent atrial flutter on flecainide. I discussed this patient in detail with Dr. Johney FrameAllred who recommends cardioversion. We will set him up for tomorrow. He is on Xarelto. Follow-up with  Dr. Katrinka BlazingSmith.

## 2014-08-12 NOTE — Addendum Note (Signed)
Addended by: Oleta MouseVERTON, SHANA M on: 08/12/2014 01:43 PM   Modules accepted: Orders

## 2014-08-12 NOTE — Telephone Encounter (Signed)
New Msg        Pt states he has been having an irregular heart beat and wants to have an EKG.    Please return pt call.

## 2014-08-12 NOTE — Assessment & Plan Note (Signed)
Patient is on Xarelto 

## 2014-08-12 NOTE — Assessment & Plan Note (Signed)
C Pap mask was making his face red and he switched of the nasal cannula which doesn't seem to be as effective.

## 2014-08-12 NOTE — Telephone Encounter (Signed)
Pt aware. He should come to the office now to be seen by Delphina CahillMichelle Lenze,PA for an EKG.

## 2014-08-12 NOTE — Patient Instructions (Addendum)
Your physician recommends that you continue on your current medications as directed. Please refer to the Current Medication list given to you today.   Your physician has recommended that you have a Cardioversion  Your scheduled for a cardioversion on 08/13/14 At 11 am with DR Andreas Ohmkatrina  Nelson  [facility]   Please have nothing to drink or eat after midnight tonight   You may take your morning meds with only water the morning of procedure   Make sure you have some one to drve you home after your discharge    (DCCV). Electrical Cardioversion uses a jolt of electricity to your heart either through paddles or wired patches attached to your chest. This is a controlled, usually prescheduled, procedure. Defibrillation is done under light anesthesia in the hospital, and you usually go home the day of the procedure. This is done to get your heart back into a normal rhythm. You are not awake for the procedure. Please see the instruction sheet given to you today.

## 2014-08-12 NOTE — Assessment & Plan Note (Signed)
BP stable.

## 2014-08-13 ENCOUNTER — Encounter (HOSPITAL_COMMUNITY)
Admission: RE | Disposition: A | Discharge status: Home / Self Care | Payer: BC Managed Care – PPO | Source: Ambulatory Visit | Attending: Cardiology

## 2014-08-13 ENCOUNTER — Ambulatory Visit (HOSPITAL_COMMUNITY)
Admission: RE | Admit: 2014-08-13 | Discharge: 2014-08-13 | Disposition: A | Discharge status: Home / Self Care | Payer: BC Managed Care – PPO | Source: Ambulatory Visit | Attending: Cardiology | Admitting: Cardiology

## 2014-08-13 ENCOUNTER — Ambulatory Visit (HOSPITAL_COMMUNITY): Payer: BC Managed Care – PPO | Admitting: Certified Registered Nurse Anesthetist

## 2014-08-13 DIAGNOSIS — E039 Hypothyroidism, unspecified: Secondary | ICD-10-CM | POA: Insufficient documentation

## 2014-08-13 DIAGNOSIS — I4891 Unspecified atrial fibrillation: Secondary | ICD-10-CM

## 2014-08-13 DIAGNOSIS — Z9889 Other specified postprocedural states: Secondary | ICD-10-CM | POA: Insufficient documentation

## 2014-08-13 DIAGNOSIS — Z9989 Dependence on other enabling machines and devices: Secondary | ICD-10-CM | POA: Insufficient documentation

## 2014-08-13 DIAGNOSIS — I4892 Unspecified atrial flutter: Secondary | ICD-10-CM | POA: Insufficient documentation

## 2014-08-13 DIAGNOSIS — Z792 Long term (current) use of antibiotics: Secondary | ICD-10-CM | POA: Diagnosis not present

## 2014-08-13 DIAGNOSIS — E119 Type 2 diabetes mellitus without complications: Secondary | ICD-10-CM | POA: Insufficient documentation

## 2014-08-13 DIAGNOSIS — G4733 Obstructive sleep apnea (adult) (pediatric): Secondary | ICD-10-CM | POA: Insufficient documentation

## 2014-08-13 DIAGNOSIS — I10 Essential (primary) hypertension: Secondary | ICD-10-CM | POA: Diagnosis not present

## 2014-08-13 DIAGNOSIS — Z79899 Other long term (current) drug therapy: Secondary | ICD-10-CM | POA: Diagnosis not present

## 2014-08-13 DIAGNOSIS — E785 Hyperlipidemia, unspecified: Secondary | ICD-10-CM | POA: Insufficient documentation

## 2014-08-13 DIAGNOSIS — Z7901 Long term (current) use of anticoagulants: Secondary | ICD-10-CM | POA: Diagnosis not present

## 2014-08-13 HISTORY — PX: CARDIOVERSION: SHX1299

## 2014-08-13 SURGERY — CARDIOVERSION
Anesthesia: General

## 2014-08-13 MED ORDER — SODIUM CHLORIDE 0.9 % IJ SOLN: 3.0000 mL | Freq: Two times a day (BID) | INTRAMUSCULAR | Status: DC

## 2014-08-13 MED ORDER — HYDROCORTISONE 1 % EX CREA
1.0000 "application " | TOPICAL_CREAM | Freq: Three times a day (TID) | CUTANEOUS | Status: DC | PRN
  Filled 2014-08-13: qty 28

## 2014-08-13 MED ORDER — PROPOFOL 10 MG/ML IV BOLUS
INTRAVENOUS | Status: DC | PRN
  Administered 2014-08-13: 80 mg via INTRAVENOUS

## 2014-08-13 MED ORDER — SODIUM CHLORIDE 0.9 % IV SOLN
250.0000 mL | INTRAVENOUS | Status: DC
  Administered 2014-08-13: 500 mL via INTRAVENOUS

## 2014-08-13 MED ORDER — SODIUM CHLORIDE 0.9 % IJ SOLN: 3.0000 mL | INTRAMUSCULAR | Status: DC | PRN

## 2014-08-13 MED ORDER — LIDOCAINE HCL (CARDIAC) 20 MG/ML IV SOLN
INTRAVENOUS | Status: DC | PRN
  Administered 2014-08-13: 60 mg via INTRAVENOUS

## 2014-08-13 NOTE — Transfer of Care (Signed)
Immediate Anesthesia Transfer of Care Note  Patient: Anthony Cole  Procedure(s) Performed: Procedure(s): CARDIOVERSION (N/A)  Patient Location: PACU and Endoscopy Unit  Anesthesia Type:General  Level of Consciousness: awake, alert  and oriented  Airway & Oxygen Therapy: Patient Spontanous Breathing and Patient connected to nasal cannula oxygen  Post-op Assessment: Report given to RN and Post -op Vital signs reviewed and stable  Post vital signs: Reviewed and stable  Last Vitals:  Filed Vitals:   08/13/14 1000  BP: 136/83  Temp:   Resp:     Complications: No apparent anesthesia complications

## 2014-08-13 NOTE — Discharge Instructions (Signed)
Electrical Cardioversion, Care After °Refer to this sheet in the next few weeks. These instructions provide you with information on caring for yourself after your procedure. Your health care provider may also give you more specific instructions. Your treatment has been planned according to current medical practices, but problems sometimes occur. Call your health care provider if you have any problems or questions after your procedure. °WHAT TO EXPECT AFTER THE PROCEDURE °After your procedure, it is typical to have the following sensations: °· Some redness on the skin where the shocks were delivered. If this is tender, a sunburn lotion or hydrocortisone cream may help. °· Possible return of an abnormal heart rhythm within hours or days after the procedure. °HOME CARE INSTRUCTIONS °· Take medicines only as directed by your health care provider. Be sure you understand how and when to take your medicine. °· Learn how to feel your pulse and check it often. °· Limit your activity for 48 hours after the procedure or as directed by your health care provider. °· Avoid or minimize caffeine and other stimulants as directed by your health care provider. °SEEK MEDICAL CARE IF: °· You feel like your heart is beating too fast or your pulse is not regular. °· You have any questions about your medicines. °· You have bleeding that will not stop. °SEEK IMMEDIATE MEDICAL CARE IF: °· You are dizzy or feel faint. °· It is hard to breathe or you feel short of breath. °· There is a change in discomfort in your chest. °· Your speech is slurred or you have trouble moving an arm or leg on one side of your body. °· You get a serious muscle cramp that does not go away. °· Your fingers or toes turn cold or blue. °Document Released: 03/14/2013 Document Revised: 10/08/2013 Document Reviewed: 03/14/2013 °ExitCare® Patient Information ©2015 [employer]. This information is not intended to replace advice given to you by your health care provider.  Make sure you discuss any questions you have with your health care provider. ° ° °Conscious Sedation, Adult, Care After °Refer to this sheet in the next few weeks. These instructions provide you with information on caring for yourself after your procedure. Your health care provider may also give you more specific instructions. Your treatment has been planned according to current medical practices, but problems sometimes occur. Call your health care provider if you have any problems or questions after your procedure. °WHAT TO EXPECT AFTER THE PROCEDURE  °After your procedure: °· You may feel sleepy, clumsy, and have poor balance for several hours. °· Vomiting may occur if you eat too soon after the procedure. °HOME CARE INSTRUCTIONS °· Do not participate in any activities where you could become injured for at least 24 hours. Do not: °¨ Drive. °¨ Swim. °¨ Ride a bicycle. °¨ Operate heavy machinery. °¨ Cook. °¨ Use power tools. °¨ Climb ladders. °¨ Work from a high place. °· Do not make important decisions or sign legal documents until you are improved. °· If you vomit, drink water, juice, or soup when you can drink without vomiting. Make sure you have little or no nausea before eating solid foods. °· Only take over-the-counter or prescription medicines for pain, discomfort, or fever as directed by your health care provider. °· Make sure you and your family fully understand everything about the medicines given to you, including what side effects may occur. °· You should not drink alcohol, take sleeping pills, or take medicines that cause drowsiness for at least 24   hours. °· If you smoke, do not smoke without supervision. °· If you are feeling better, you may resume normal activities 24 hours after you were sedated. °· Keep all appointments with your health care provider. °SEEK MEDICAL CARE IF: °· Your skin is pale or bluish in color. °· You continue to feel nauseous or vomit. °· Your pain is getting worse and is not  helped by medicine. °· You have bleeding or swelling. °· You are still sleepy or feeling clumsy after 24 hours. °SEEK IMMEDIATE MEDICAL CARE IF: °· You develop a rash. °· You have difficulty breathing. °· You develop any type of allergic problem. °· You have a fever. °MAKE SURE YOU: °· Understand these instructions. °· Will watch your condition. °· Will get help right away if you are not doing well or get worse. °Document Released: 03/14/2013 Document Reviewed: 03/14/2013 °ExitCare® Patient Information ©2015 [employer]. This information is not intended to replace advice given to you by your health care provider. Make sure you discuss any questions you have with your health care provider. ° °

## 2014-08-13 NOTE — Anesthesia Preprocedure Evaluation (Addendum)
Anesthesia Evaluation  Patient identified by MRN, date of birth, ID band Patient awake    Reviewed: Allergy & Precautions, NPO status , Patient's Chart, lab work & pertinent test results  Airway Mallampati: II  TM Distance: >3 FB Neck ROM: Full    Dental  (+) Teeth Intact   Pulmonary sleep apnea ,          Cardiovascular hypertension, Pt. on medications     Neuro/Psych    GI/Hepatic   Endo/Other  diabetes, Type 2Hypothyroidism   Renal/GU      Musculoskeletal   Abdominal   Peds  Hematology   Anesthesia Other Findings   Reproductive/Obstetrics                            Anesthesia Physical Anesthesia Plan  ASA: III  Anesthesia Plan: General   Post-op Pain Management:    Induction: Intravenous  Airway Management Planned: Mask  Additional Equipment:   Intra-op Plan:   Post-operative Plan:   Informed Consent:   Dental advisory given  Plan Discussed with: CRNA, Anesthesiologist and Surgeon  Anesthesia Plan Comments:        Anesthesia Quick Evaluation

## 2014-08-13 NOTE — Interval H&P Note (Signed)
History and Physical Interval Note:  08/13/2014 10:27 AM  Anthony Cole  has presented today for surgery, with the diagnosis of afib  The various methods of treatment have been discussed with the patient and family. After consideration of risks, benefits and other options for treatment, the patient has consented to  Procedure(s): CARDIOVERSION (N/A) as a surgical intervention .  The patient's history has been reviewed, patient examined, no change in status, stable for surgery.  I have reviewed the patient's chart and labs.  Questions were answered to the patient's satisfaction.     Lars MassonNELSON, Ayaz Sondgeroth H

## 2014-08-13 NOTE — CV Procedure (Signed)
    Cardioversion Note  Anthony SabinaRoderick B Cole [id number] 02/06/1951  Procedure: DC Cardioversion Indications: atrial fibrillation  Procedure Details Consent: Obtained Time Out: Verified patient identification, verified procedure, site/side was marked, verified correct patient position, special equipment/implants available, Radiology Safety Procedures followed,  medications/allergies/relevent history reviewed, required imaging and test results available.  Performed  The patient has been on adequate anticoagulation.  The patient received IV propofol administered by anesthesia staff for sedation.  Synchronous cardioversion was performed at 120 joules.  The cardioversion was successful.   Complications: No apparent complications Patient did tolerate procedure well.   Lars MassonNELSON, Jaycion Treml H, MD, [facility] 08/13/2014, 10:27 AM

## 2014-08-13 NOTE — H&P (View-Only) (Signed)
Cardiology Office Note   Date:  08/12/2014   ID:  Anthony SabinaRoderick B David, DOB [birth year], MRN [id number]  PCP:  Miguel AschoffOSS,ALLAN, MD  Cardiologist: Verdis PrimeHenry Smith, M.D.  Chief Complaint: Atrial fibrillation    History of Present Illness: Anthony Cole is a 64 y.o. male who presents for recurrent atrial fibrillation. The patient has been on flecainide since last May. He has done well on flecainide and maintained normal sinus rhythm. He [facility] but has maintained sinus on flecainide so has not pursued this. On Friday he felt like he went back into atrial fibrillation and has been in it since. It is verified he is in atrial fibrillation/flutter on EKG with controlled ventricular rate. He has not missed any doses. He's had some trouble with his C Pap machine and was wondering if that had something to do with it. His mask was changed to a nasal cannula and his breathing hasn't been correct at night. Patient had normal Lexi scan Myoview in 10/2013. He is on Xarelto and his rate is controlled with diltiazem and Lopressor. He had been on amiodarone therapy in the past but this was discontinued in May 2015.   Past Medical History  Diagnosis Date  . Diabetes mellitus without complication   . Hypertension   . Hyperlipidemia   . Atrial fibrillation   . Hypothyroidism   . OSA (obstructive sleep apnea) 07/16/2014    Moderate with AHI 19/hr on CPAP at 8cm H2O    Past Surgical History  Procedure Laterality Date  . Cardioversion  11/04/2011    Procedure: CARDIOVERSION;  Surgeon: Lesleigh NoeHenry W Smith III, MD;  [facility];  Service: Cardiovascular;  Laterality: N/A;  . Cardioversion N/A 07/02/2013    Procedure: CARDIOVERSION;  Surgeon: Lesleigh NoeHenry W Smith III, MD;  [facility];  Service: Cardiovascular;  Laterality: N/A;  10:07 elective cardioversion Lido 40mg , Propofol 60mg ,IV...200 joules, synched, pt presently in A flutter,...cardioverted to NSR...  . Stapedes surgery  Right   . Rotator cuff repair Bilateral   . Knee arthroscopy w/ debridement Bilateral   . Tonsillectomy    . Finger surgery Left   . Cardioversion N/A 09/19/2013    Procedure: CARDIOVERSION;  Surgeon: Lesleigh NoeHenry W Smith III, MD;  [facility];  Service: Cardiovascular;  Laterality: N/A;     Current Outpatient Prescriptions  Medication Sig Dispense Refill  . acyclovir (ZOVIRAX) 200 MG capsule     . B Complex Vitamins (VITAMIN B COMPLEX PO) Take 1 tablet by mouth daily.    . cholecalciferol (VITAMIN D) 1000 UNITS tablet Take 1,000 Units by mouth every morning.    [name]. desonide (DESOWEN) 0.05 % lotion     . diltiazem (CARDIZEM) 30 MG tablet Take 1 tablet (30 mg total) by mouth 4 (four) times daily as needed. For Afib with heartrate above 100bpm    . doxycycline (VIBRA-TABS) 100 MG tablet Take 100 mg by mouth daily as needed (for rosacea on face).     . flecainide (TAMBOCOR) 100 MG tablet Take 100 mg by mouth 2 (two) times daily.    [name]. levothyroxine (SYNTHROID, LEVOTHROID) 50 MCG tablet Take 50 mcg by mouth daily. Taking 1.5 daily    . metoprolol tartrate (LOPRESSOR) 25 MG tablet Take 25 mg by mouth every morning. Taking 12.5mg  in the morning.    . Multiple Vitamin (MULITIVITAMIN WITH MINERALS) TABS Take 1 tablet by mouth every morning.    . rivaroxaban (XARELTO) 20 MG TABS tablet Take  1 tablet (20 mg total) by mouth daily. 90 tablet 3  . rosuvastatin (CRESTOR) 10 MG tablet Take 10 mg by mouth daily.     Brynda Greathouse. SOOLANTRA 1 % CREA      No current facility-administered medications for this visit.    Allergies:   Gabapentin; Other; Glucosamine; and Glucosamine forte    Social History:  The patient  reports that he has never smoked. He has never used smokeless tobacco. He reports that he does not drink alcohol or use illicit drugs.   Family History:  The patient's family history includes Hypertension in his mother; Stroke in his maternal grandmother.    ROS:  Please see the history of present  illness.   Otherwise, review of systems are positive for none.   All other systems are reviewed and negative.    PHYSICAL EXAM: VS:  BP 142/86 mmHg  Pulse 76  Ht 5\' 10"  (1.778 m)  Wt 186 lb 1.9 oz (84.423 kg)  BMI 26.71 kg/m2 , BMI Body mass index is 26.71 kg/(m^2). GEN: Well nourished, well developed, in no acute distress HEENT: normal Neck: no JVD, HJR, carotid bruits, or masses Cardiac: Irregular irregular; no gallop ,murmurs, rubs, thrill or heave,no edema,   Respiratory:  clear to auscultation bilaterally, normal work of breathing GI: soft, nontender, nondistended, + BS MS: no deformity or atrophy Extremities: without cyanosis, clubbing, edema, good distal pulses bilaterally.  Skin: warm and dry, no rash Neuro:  Strength and sensation are intact Psych: Extremely anxious   EKG:  EKG is ordered today. The ekg ordered today demonstrates atrial flutter at 76 bpm   Recent Labs: 09/17/2013: BUN 20; Creatinine 1.2; Hemoglobin 13.6; Platelets 280.0; Potassium 4.0; Sodium 139 10/16/2013: ALT 52 01/28/2014: TSH 0.98    Lipid Panel No results found for: CHOL, TRIG, HDL, CHOLHDL, VLDL, LDLCALC, LDLDIRECT    Wt Readings from Last 3 Encounters:  08/12/14 186 lb 1.9 oz (84.423 kg)  07/30/14 185 lb 12.8 oz (84.278 kg)  07/17/14 187 lb 12.8 oz (85.186 kg)      Other studies Reviewed: Additional studies/ records that were reviewed today include and review of the records demonstrates:  Impression Exercise Capacity:  Lexiscan with low level exercise. BP Response:  Normal blood pressure response. Clinical Symptoms:  No significant symptoms noted. ECG Impression:  No significant ST segment change suggestive of ischemia. Comparison with Prior Nuclear Study: No images to compare  Overall Impression:  Normal stress nuclear study.  No evidence of ischemia.  Normal LV function   LV Ejection Fraction: 62%.  LV Wall Motion:  NL LV Function; NL Wall Motion.   Vesta MixerPhilip J. Nahser, Montez HagemanJr., MD,  [facility] 11/02/2013, 4:10 PM [address]    ASSESSMENT AND PLAN:  Atrial flutter Recurrent atrial flutter on flecainide. I discussed this patient in detail with Dr. Johney FrameAllred who recommends cardioversion. We will set him up for tomorrow. He is on Xarelto. Follow-up with  Dr. Katrinka BlazingSmith.   Essential hypertension, benign BP stable   OSA (obstructive sleep apnea) C Pap mask was making his face red and he switched of the nasal cannula which doesn't seem to be as effective.   Long term current use of anticoagulant therapy Patient is on Xarelto.     Elson ClanSigned, Michele Lenze, PA-C  08/12/2014 12:37 PM    [facility] [address], [place], [place]  [id number] Phone: [phone]; Fax: [phone]

## 2014-08-13 NOTE — Anesthesia Postprocedure Evaluation (Signed)
  Anesthesia Post-op Note  Patient: Anthony Cole  Procedure(s) Performed: Procedure(s): CARDIOVERSION (N/A)  Patient Location: PACU  Anesthesia Type:General  Level of Consciousness: awake, alert , oriented and patient cooperative  Airway and Oxygen Therapy: Patient Spontanous Breathing  Post-op Pain: none  Post-op Assessment: Post-op Vital signs reviewed, Patient's Cardiovascular Status Stable, Respiratory Function Stable, Patent Airway, No signs of Nausea or vomiting and Pain level controlled  Post-op Vital Signs: stable  Last Vitals:  Filed Vitals:   08/13/14 1053  BP: 144/87  Temp:   Resp: 17    Complications: No apparent anesthesia complications

## 2014-08-14 ENCOUNTER — Encounter (HOSPITAL_COMMUNITY): Payer: Self-pay | Admitting: Cardiology

## 2014-09-11 ENCOUNTER — Encounter: Payer: Self-pay | Admitting: Cardiology

## 2014-10-15 ENCOUNTER — Other Ambulatory Visit: Payer: Self-pay | Admitting: Interventional Cardiology

## 2014-11-07 ENCOUNTER — Encounter: Payer: Self-pay | Admitting: Cardiology

## 2015-01-30 ENCOUNTER — Encounter: Payer: Self-pay | Admitting: Cardiology

## 2015-01-30 ENCOUNTER — Ambulatory Visit (INDEPENDENT_AMBULATORY_CARE_PROVIDER_SITE_OTHER): Payer: BC Managed Care – PPO | Admitting: Cardiology

## 2015-01-30 VITALS — BP 110/60 | HR 58 | Ht 69.0 in | Wt 185.1 lb

## 2015-01-30 DIAGNOSIS — G4733 Obstructive sleep apnea (adult) (pediatric): Secondary | ICD-10-CM | POA: Diagnosis not present

## 2015-01-30 DIAGNOSIS — I10 Essential (primary) hypertension: Secondary | ICD-10-CM

## 2015-01-30 NOTE — Patient Instructions (Signed)

## 2015-01-30 NOTE — Progress Notes (Addendum)
Cardiology Office Note   Date:  01/30/2015   ID:  Anthony Cole, DOB 1951/02/01, MRN [id number]  PCP:  Donovan Kail, MD    Chief Complaint  Patient presents with  . Follow-up    osa      History of Present Illness: Anthony Cole is a 64 y.o. male who presents for followup of sleep apnea. The patient complained to Dr. Alyson Locket at [facility] about excessive daytime sleepiness and nonrestorative sleep and excessive daytime sleepiness as well as PAF and underwent nocturnal PSG showing moderate OSA with an AHI of 19 events per hour. He underwent CPAP titration to 8cm H2O and now presents for further evaluation. He is doing well with his CPAP therapy. He tolerates the CPAP. He tolerates the full face mask well. He is a mouth breather. He feels the pressure is adequate. He feels rested in the am and has no daytime sleepiness unless he has not slept well the night before.     Past Medical History  Diagnosis Date  . Diabetes mellitus without complication   . Hypertension   . Hyperlipidemia   . Atrial fibrillation   . Hypothyroidism   . OSA (obstructive sleep apnea) 07/16/2014    Moderate with AHI 19/hr on CPAP at 8cm H2O    Past Surgical History  Procedure Laterality Date  . Cardioversion  11/04/2011    Procedure: CARDIOVERSION;  Surgeon: Lesleigh Noe, MD;  Location: [facility] OR;  Service: Cardiovascular;  Laterality: N/A;  . Cardioversion N/A 07/02/2013    Procedure: CARDIOVERSION;  Surgeon: Lesleigh Noe, MD;  Location: [facility] ENDOSCOPY;  Service: Cardiovascular;  Laterality: N/A;  10:07 elective cardioversion Lido 40mg , Propofol 60mg ,IV...200 joules, synched, pt presently in A flutter,...cardioverted to NSR...  . Stapedes surgery Right   . Rotator cuff repair Bilateral   . Knee arthroscopy w/ debridement Bilateral   . Tonsillectomy    . Finger surgery Left   . Cardioversion N/A 09/19/2013    Procedure: CARDIOVERSION;  Surgeon: Lesleigh Noe, MD;   Location: [facility] ENDOSCOPY;  Service: Cardiovascular;  Laterality: N/A;  . Cardioversion N/A 08/13/2014    Procedure: CARDIOVERSION;  Surgeon: Lars Masson, MD;  Location: [facility] ENDOSCOPY;  Service: Cardiovascular;  Laterality: N/A;     Current Outpatient Prescriptions  Medication Sig Dispense Refill  . acyclovir (ZOVIRAX) 200 MG capsule Take 200 mg by mouth once as needed (first sign of fever blister).     [name] acyclovir ointment (ZOVIRAX) 5 % Apply 1 application topically daily as needed (first sign of fever blister).    [name] desonide (DESOWEN) 0.05 % lotion Apply 1 application topically 2 (two) times daily as needed (roscea).     . diltiazem (CARDIZEM) 30 MG tablet Take 30 mg by mouth 4 (four) times daily. Pt takes as needed    . doxycycline (VIBRA-TABS) 100 MG tablet Take 100 mg by mouth daily as needed (for rosacea on face).     . flecainide (TAMBOCOR) 100 MG tablet Take 100 mg by mouth 2 (two) times daily.    [name] levothyroxine (SYNTHROID, LEVOTHROID) 88 MCG tablet     . metoprolol succinate (TOPROL-XL) 25 MG 24 hr tablet     . metoprolol tartrate (LOPRESSOR) 25 MG tablet Take 12.5 mg by mouth daily after breakfast.     . Multiple Vitamin (MULITIVITAMIN WITH MINERALS) TABS Take 1 tablet by mouth every morning.    [name]  rosuvastatin (CRESTOR) 10 MG tablet Take 10 mg by mouth daily.     . SOOLANTRA 1 % CREA Take 1 application by mouth daily as needed (roscea).     [name] 20 MG TABS tablet TAKE 1 TABLET ONCE DAILY. 90 tablet 1   No current facility-administered medications for this visit.    Allergies:   Gabapentin; Other; and Glucosamine forte    Social History:  The patient  reports that he has never smoked. He has never used smokeless tobacco. He reports that he does not drink alcohol or use illicit drugs.   Family History:  The patient's family history includes Hypertension in his mother; Stroke in his maternal grandmother.    ROS:  Please see the history of present illness.   Otherwise,  review of systems are positive for none.   All other systems are reviewed and negative.    PHYSICAL EXAM: VS:  BP 110/60 mmHg  Pulse 58  Ht  (1.753 m)  Wt 185 lb 1.9 oz (83.97 kg)  BMI 27.33 kg/m2  SpO2 98% , BMI Body mass index is 27.33 kg/(m^2). GEN: Well nourished, well developed, in no acute distress HEENT: normal Neck: no JVD, carotid bruits, or masses Cardiac: RRR; no murmurs, rubs, or gallops,no edema  Respiratory:  clear to auscultation bilaterally, normal work of breathing GI: soft, nontender, nondistended, + BS MS: no deformity or atrophy Skin: warm and dry, no rash Neuro:  Strength and sensation are intact Psych: euthymic mood, full affect   EKG:  EKG is not ordered today.    Recent Labs: 08/12/2014: BUN 23; Creatinine, Ser 0.88; Hemoglobin 14.7; Platelets 260.0; Potassium 4.0; Sodium 138    Lipid Panel No results found for: CHOL, TRIG, HDL, CHOLHDL, VLDL, LDLCALC, LDLDIRECT    Wt Readings from Last 3 Encounters:  01/30/15 185 lb 1.9 oz (83.97 kg)  08/13/14 186 lb (84.369 kg)  08/12/14 186 lb 1.9 oz (84.423 kg)      ASSESSMENT AND PLAN:  1. Moderate OSA with AHI 19/hr now on CPAP at 9cm H2O. His download today showed an AHI of 3.8 events per hour on 9cm H2O. He is 100% compliant in using more than 4 hours nightly. His average usage is 8 hours. 2. HTN- controlled on Toprol  Current medicines are reviewed at length with the patient today.  The patient does not have concerns regarding medicines.  The following changes have been made:  no change  Labs/ tests ordered today: See above Assessment and Plan No orders of the defined types were placed in this encounter.     Disposition:   FU with me in 1 year  Signed, Quintella Reichert, MD  01/30/2015 9:01 AM    [facility] [address] [place], [place], [place]  [address] Phone: [phone]; Fax: [phone]

## 2015-02-06 ENCOUNTER — Encounter: Payer: Self-pay | Admitting: Cardiology

## 2015-04-15 ENCOUNTER — Other Ambulatory Visit: Payer: Self-pay | Admitting: Interventional Cardiology

## 2015-05-06 ENCOUNTER — Encounter: Payer: Self-pay | Admitting: Interventional Cardiology

## 2015-05-06 ENCOUNTER — Ambulatory Visit (INDEPENDENT_AMBULATORY_CARE_PROVIDER_SITE_OTHER): Payer: BC Managed Care – PPO | Admitting: Interventional Cardiology

## 2015-05-06 VITALS — BP 142/80 | HR 59 | Ht 69.0 in | Wt 189.6 lb

## 2015-05-06 DIAGNOSIS — Z7901 Long term (current) use of anticoagulants: Secondary | ICD-10-CM

## 2015-05-06 DIAGNOSIS — I480 Paroxysmal atrial fibrillation: Secondary | ICD-10-CM | POA: Diagnosis not present

## 2015-05-06 DIAGNOSIS — R079 Chest pain, unspecified: Secondary | ICD-10-CM

## 2015-05-06 DIAGNOSIS — E1159 Type 2 diabetes mellitus with other circulatory complications: Secondary | ICD-10-CM

## 2015-05-06 DIAGNOSIS — I2510 Atherosclerotic heart disease of native coronary artery without angina pectoris: Secondary | ICD-10-CM

## 2015-05-06 DIAGNOSIS — I10 Essential (primary) hypertension: Secondary | ICD-10-CM | POA: Diagnosis not present

## 2015-05-06 MED ORDER — RIVAROXABAN 20 MG PO TABS: 20.0000 mg | ORAL_TABLET | Freq: Every day | ORAL | Status: DC

## 2015-05-06 NOTE — Patient Instructions (Signed)
Medication Instructions:  Your physician recommends that you continue on your current medications as directed. Please refer to the Current Medication list given to you today.   Labwork: None ordered  Testing/Procedures: Your physician has requested that you have en exercise stress myoview. For further information please visit [email or web address]. Please follow instruction sheet, as given.   Follow-Up: Your physician wants you to follow-up in: 8-9 months with Dr.Smith You will receive a reminder letter in the mail two months in advance. If you don't receive a letter, please call our office to schedule the follow-up appointment.   Any Other Special Instructions Will Be Listed Below (If Applicable).     If you need a refill on your cardiac medications before your next appointment, please call your pharmacy.

## 2015-05-06 NOTE — Progress Notes (Signed)
Cardiology Office Note   Date:  05/06/2015   ID:  Anthony SabinaRoderick B Lechuga, DOB 01/29/1951, MRN [id number]  PCP:  Donovan KailOSS,ALLeN, MD  Cardiologist:  Lesleigh NoeSMITH III,Amit Leece W, MD   Chief Complaint  Patient presents with  . Atrial Fibrillation      History of Present Illness: Anthony Cole is a 64 y.o. male who presents for obstructive sleep apnea, paroxysmal atrial fibrillation, hypertension, hyperlipidemia, and diabetes mellitus without complications.  3 days ago while doing his typical walk, he developed precordial pressure while walking. The discomfort lasted approximate 5 minutes and then gradually subsided. He has been somewhat reluctant to do much exercise since that time. He states that the discomfort felt very similar to that which occurred during [facility] myocardial perfusion imaging. He has had no subsequent symptoms but is concerned and inquisitive about the genesis of the discomfort.  No recent episodes of atrial fibrillation. Last cardioversion was in April 2016.    Past Medical History  Diagnosis Date  . Diabetes mellitus without complication (HCC)   . Hypertension   . Hyperlipidemia   . Atrial fibrillation (HCC)   . Hypothyroidism   . OSA (obstructive sleep apnea) 07/16/2014    Moderate with AHI 19/hr on CPAP at 8cm H2O    Past Surgical History  Procedure Laterality Date  . Cardioversion  11/04/2011    Procedure: CARDIOVERSION;  Surgeon: Lesleigh NoeHenry W Shajuan Musso III, MD;  [facility];  Service: Cardiovascular;  Laterality: N/A;  . Cardioversion N/A 07/02/2013    Procedure: CARDIOVERSION;  Surgeon: Lesleigh NoeHenry W Alaney Witter III, MD;  [facility];  Service: Cardiovascular;  Laterality: N/A;  10:07 elective cardioversion Lido 40mg , Propofol 60mg ,IV...200 joules, synched, pt presently in A flutter,...cardioverted to NSR...  . Stapedes surgery Right   . Rotator cuff repair Bilateral   . Knee arthroscopy w/ debridement Bilateral   . Tonsillectomy    . Finger surgery Left   .  Cardioversion N/A 09/19/2013    Procedure: CARDIOVERSION;  Surgeon: Lesleigh NoeHenry W Keyleigh Manninen III, MD;  [facility];  Service: Cardiovascular;  Laterality: N/A;  . Cardioversion N/A 08/13/2014    Procedure: CARDIOVERSION;  Surgeon: Lars MassonKatarina H Nelson, MD;  [facility];  Service: Cardiovascular;  Laterality: N/A;     Current Outpatient Prescriptions  Medication Sig Dispense Refill  . acyclovir (ZOVIRAX) 200 MG capsule Take 200 mg by mouth once as needed (first sign of fever blister).     [name]. acyclovir ointment (ZOVIRAX) 5 % Apply 1 application topically daily as needed (first sign of fever blister).    [name]. desonide (DESOWEN) 0.05 % lotion Apply 1 application topically 2 (two) times daily as needed (roscea).     . diltiazem (CARDIZEM) 30 MG tablet Take 30 mg by mouth 4 (four) times daily. Pt takes as needed    . doxycycline (VIBRA-TABS) 100 MG tablet Take 100 mg by mouth daily as needed (for rosacea on face).     . flecainide (TAMBOCOR) 100 MG tablet Take 100 mg by mouth 2 (two) times daily.    [name]. levothyroxine (SYNTHROID, LEVOTHROID) 88 MCG tablet Take 88 mcg by mouth daily before breakfast.     . metoprolol tartrate (LOPRESSOR) 25 MG tablet Take 12.5 mg by mouth daily after breakfast.     . Multiple Vitamin (MULITIVITAMIN WITH MINERALS) TABS Take 1 tablet by mouth every morning.    . rivaroxaban (XARELTO) 20 MG TABS tablet Take 1 tablet (20 mg total) by mouth daily. 90 tablet 3  . rosuvastatin (  CRESTOR) 10 MG tablet Take 10 mg by mouth daily.     . SOOLANTRA 1 % CREA Take 1 application by mouth daily as needed (roscea).      No current facility-administered medications for this visit.    Allergies:   Gabapentin; Other; and Glucosamine forte    Social History:  The patient  reports that he has never smoked. He has never used smokeless tobacco. He reports that he does not drink alcohol or use illicit drugs.   Family History:  The patient's family history includes Hypertension in his  mother; Stroke in his maternal grandmother.    ROS:  Please see the history of present illness.   Otherwise, review of systems are positive for anxiety and concern about expensive testing. Has also had fleeting sharp focal episodes of chest discomfort the last up to a minute. These episodes are nonexertional..   All other systems are reviewed and negative.    PHYSICAL EXAM: VS:  BP 142/80 mmHg  Pulse 59  Ht  (1.753 m)  Wt 189 lb 9.6 oz (86.002 kg)  BMI 27.99 kg/m2 , BMI Body mass index is 27.99 kg/(m^2). GEN: Well nourished, well developed, in no acute distress HEENT: normal Neck: no JVD, carotid bruits, or masses Cardiac: RRR.  There is no murmur, rub, or gallop. There is no edema. Respiratory:  clear to auscultation bilaterally, normal work of breathing. GI: soft, nontender, nondistended, + BS MS: no deformity or atrophy Skin: warm and dry, no rash Neuro:  Strength and sensation are intact Psych: euthymic mood, full affect   EKG:  EKG is ordered today. The ekg reveals sinus bradycardia 59 bpm with nonspecific T-wave abnormality.   Recent Labs: 08/12/2014: BUN 23; Creatinine, Ser 0.88; Hemoglobin 14.7; Platelets 260.0; Potassium 4.0; Sodium 138    Lipid Panel No results found for: CHOL, TRIG, HDL, CHOLHDL, VLDL, LDLCALC, LDLDIRECT    Wt Readings from Last 3 Encounters:  05/06/15 189 lb 9.6 oz (86.002 kg)  01/30/15 185 lb 1.9 oz (83.97 kg)  08/13/14 186 lb (84.369 kg)      Other studies Reviewed: Additional studies/ records that were reviewed today include: Review of data to determine if any prior nuclear or coronary evaluation.. The findings include prior to EP study/ablation a stress myocardial perfusion study was performed and did not reveal evidence of ischemia June 2015 - low risk myocardial perfusion study.    ASSESSMENT AND PLAN:  1. Paroxysmal atrial fibrillation (HCC) Well controlled on flecainide. No recent recurrences. - EKG 12-Lead  2. Essential  hypertension Controlled - EKG 12-Lead  3. Chronic anticoagulation No bleeding on anticoagulation therapy  5. 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 Chest pain, unspecified chest pain type This is a new complaint and has qualities consistent with angina/ischemia. - EKG 12-Lead - Myocardial Perfusion Imaging; Future   Current medicines are reviewed at length with the patient today.  The patient has the following concerns regarding medicines: Concerned about getting Xarelto refilled..  The following changes/actions have been instituted:    Stress myocardial perfusion study to rule out ischemia  Notify if recurrent chest discomfort  Refill prescription said he refilling  Labs/ tests ordered today include:   Orders Placed This Encounter  Procedures  . Myocardial Perfusion Imaging  . EKG 12-Lead     Disposition:   FU with HS in 8 month  Signed, Lesleigh Noe, MD  05/06/2015 2:17 PM    [facility] [place], [place], [place]  [address] Phone: [phone]; Fax: [phone]

## 2015-05-12 ENCOUNTER — Telehealth (HOSPITAL_COMMUNITY): Payer: Self-pay | Admitting: *Deleted

## 2015-05-12 NOTE — Telephone Encounter (Signed)
Left message on voicemail in reference to upcoming appointment scheduled for 05/14/15. Phone number given for a call back so details instructions can be given. Anthony Cole   

## 2015-05-13 ENCOUNTER — Telehealth (HOSPITAL_COMMUNITY): Payer: Self-pay | Admitting: *Deleted

## 2015-05-13 NOTE — Telephone Encounter (Signed)
Patient given detailed instructions per Myocardial Perfusion Study Information Sheet for the test on 05/14/15 at 0715. Patient notified to arrive 15 minutes early and that it is imperative to arrive on time for appointment to keep from having the test rescheduled.  If you need to cancel or reschedule your appointment, please call the office within 24 hours of your appointment. Failure to do so may result in a cancellation of your appointment, and a $50 no show fee. Patient verbalized understanding.Anthony Cole W    

## 2015-05-14 ENCOUNTER — Ambulatory Visit (HOSPITAL_COMMUNITY): Payer: BC Managed Care – PPO | Attending: Cardiovascular Disease

## 2015-05-14 DIAGNOSIS — R0609 Other forms of dyspnea: Secondary | ICD-10-CM | POA: Diagnosis not present

## 2015-05-14 DIAGNOSIS — I10 Essential (primary) hypertension: Secondary | ICD-10-CM | POA: Insufficient documentation

## 2015-05-14 DIAGNOSIS — E119 Type 2 diabetes mellitus without complications: Secondary | ICD-10-CM | POA: Insufficient documentation

## 2015-05-14 DIAGNOSIS — R079 Chest pain, unspecified: Secondary | ICD-10-CM | POA: Diagnosis present

## 2015-05-14 LAB — MYOCARDIAL PERFUSION IMAGING
CHL CUP MPHR: 157 {beats}/min
CHL CUP NUCLEAR SRS: 0
CHL CUP RESTING HR STRESS: 54 {beats}/min
CSEPEDS: 30 s
CSEPPHR: 139 {beats}/min
Estimated workload: 12.5 METS
Exercise duration (min): 10 min
LV sys vol: 51 mL
LVDIAVOL: 124 mL
NUC STRESS TID: 1
Percent HR: 88 %
RATE: 0.33
SDS: 1
SSS: 1

## 2015-05-14 MED ORDER — TECHNETIUM TC 99M SESTAMIBI GENERIC - CARDIOLITE
10.6000 | Freq: Once | INTRAVENOUS | Status: AC | PRN
  Administered 2015-05-14: 11 via INTRAVENOUS

## 2015-05-14 MED ORDER — TECHNETIUM TC 99M SESTAMIBI GENERIC - CARDIOLITE
32.4000 | Freq: Once | INTRAVENOUS | Status: AC | PRN
  Administered 2015-05-14: 32 via INTRAVENOUS

## 2015-05-15 ENCOUNTER — Telehealth: Payer: Self-pay

## 2015-05-15 NOTE — Telephone Encounter (Signed)
Called to give pt myoview results.lmtcb 

## 2015-05-15 NOTE — Telephone Encounter (Signed)
-----   Message from Lyn RecordsHenry W Smith, MD sent at 05/14/2015  9:00 PM EST ----- Low risk and no abnormality found.

## 2015-05-16 NOTE — Telephone Encounter (Signed)
Notified of stress test results.  Will fax copy to Dr[facility] Val [facility]ram Bahnson at [facility] at [phone]; phone # [phone]. Pt also requesting copy of stress test and EKG. Will leave at front desk.

## 2015-05-16 NOTE — Telephone Encounter (Signed)
New Message   Pt calling returning call from GeorgeLisa

## 2015-07-14 ENCOUNTER — Other Ambulatory Visit: Payer: Self-pay | Admitting: Interventional Cardiology

## 2015-11-20 ENCOUNTER — Telehealth: Payer: Self-pay | Admitting: *Deleted

## 2015-11-20 DIAGNOSIS — G4733 Obstructive sleep apnea (adult) (pediatric): Secondary | ICD-10-CM

## 2015-11-20 NOTE — Telephone Encounter (Signed)
Patient stated that he needs a new order through [facility] for his c-pap supplies. He can be reached at [phone]. Thanks, MI

## 2015-11-20 NOTE — Telephone Encounter (Signed)
Order has been placed for supplies and faxed to [facility].    Patient aware and thankful for the call.

## 2016-01-15 ENCOUNTER — Encounter: Payer: Self-pay | Admitting: Cardiology

## 2016-01-25 ENCOUNTER — Encounter: Payer: Self-pay | Admitting: Cardiology

## 2016-01-25 DIAGNOSIS — E669 Obesity, unspecified: Secondary | ICD-10-CM

## 2016-01-25 HISTORY — DX: Obesity, unspecified: E66.9

## 2016-01-25 NOTE — Progress Notes (Signed)
Cardiology Office Note    Date:  01/29/2016   ID:  Anthony SabinaRoderick B Slaydon, DOB 1950-10-25, MRN [id number]  PCP:  Donovan KailOSS,ALLeN, MD  Cardiologist:  Armanda Magicraci Turner, MD   Chief Complaint  Patient presents with  . Sleep Apnea    History of Present Illness:  Anthony Cole is a [age] y.o. male who presents for followup of sleep apnea. The patient complained to [facility] about excessive daytime sleepiness and nonrestorative sleep and excessive daytime sleepiness as well as PAF and underwent nocturnal PSG showing moderate OSA with an AHI of 19 events per hour. He underwent CPAP titration to 8cm H2O and now presents for followup. He is doing well with his CPAP therapy. He tolerates the CPAP. He tolerates the full face mask well. He is a mouth breather. He feels the pressure is adequate. He feels rested in the am and has no daytime sleepiness unless he has not slept well the night before. Rarely he will have some mouth dryness.   Past Medical History:  Diagnosis Date  . Atrial fibrillation (HCC)   . Diabetes mellitus without complication (HCC)   . Hyperlipidemia   . Hypertension   . Hypothyroidism   . Obesity (BMI 30-39.9) 01/25/2016  . OSA (obstructive sleep apnea) 07/16/2014   Moderate with AHI 19/hr on CPAP at 8cm H2O    Past Surgical History:  Procedure Laterality Date  . CARDIOVERSION  11/04/2011   Procedure: CARDIOVERSION;  Surgeon: Lesleigh NoeHenry W Smith III, MD;  [facility];  Service: Cardiovascular;  Laterality: N/A;  . CARDIOVERSION N/A 07/02/2013   Procedure: CARDIOVERSION;  Surgeon: Lesleigh NoeHenry W Smith III, MD;  [facility];  Service: Cardiovascular;  Laterality: N/A;  10:07 elective cardioversion Lido 40mg , Propofol 60mg ,IV...200 joules, synched, pt presently in A flutter,...cardioverted to NSR...  . CARDIOVERSION N/A 09/19/2013   Procedure: CARDIOVERSION;  Surgeon: Lesleigh NoeHenry W Smith III, MD;  [facility];  Service: Cardiovascular;  Laterality: N/A;  .  CARDIOVERSION N/A 08/13/2014   Procedure: CARDIOVERSION;  Surgeon: Lars MassonKatarina H Nelson, MD;  [facility];  Service: Cardiovascular;  Laterality: N/A;  . FINGER SURGERY Left   . KNEE ARTHROSCOPY W/ DEBRIDEMENT Bilateral   . ROTATOR CUFF REPAIR Bilateral   . STAPEDES SURGERY Right   . TONSILLECTOMY      Current Medications: Outpatient Medications Prior to Visit  Medication Sig Dispense Refill  . flecainide (TAMBOCOR) 100 MG tablet Take 100 mg by mouth 2 (two) times daily.    [name]. levothyroxine (SYNTHROID, LEVOTHROID) 88 MCG tablet Take 88 mcg by mouth daily before breakfast.     . metoprolol tartrate (LOPRESSOR) 25 MG tablet Take 12.5 mg by mouth daily after breakfast.     . Multiple Vitamin (MULITIVITAMIN WITH MINERALS) TABS Take 1 tablet by mouth every morning.    . rivaroxaban (XARELTO) 20 MG TABS tablet Take 1 tablet (20 mg total) by mouth daily. 90 tablet 3  . rosuvastatin (CRESTOR) 10 MG tablet Take 10 mg by mouth daily.     [name]. acyclovir (ZOVIRAX) 200 MG capsule Take 200 mg by mouth once as needed (first sign of fever blister).     [name]. acyclovir ointment (ZOVIRAX) 5 % Apply 1 application topically daily as needed (first sign of fever blister).    [name]. desonide (DESOWEN) 0.05 % lotion Apply 1 application topically 2 (two) times daily as needed (roscea).     . diltiazem (CARDIZEM) 30 MG tablet Take 30 mg by mouth 4 (four) times daily.  Pt takes as needed    . doxycycline (VIBRA-TABS) 100 MG tablet Take 100 mg by mouth daily as needed (for rosacea on face).     . SOOLANTRA 1 % CREA Take 1 application by mouth daily as needed (roscea).      No facility-administered medications prior to visit.      Allergies:   Gabapentin; Other; and Glucosamine forte [nutritional supplements]   Social History   Social History  . Marital status: Single    Spouse name: N/A  . Number of children: N/A  . Years of education: N/A   Social History Main Topics  . Smoking status: Never Smoker  . Smokeless  tobacco: Never Used  . Alcohol use No  . Drug use: No  . Sexual activity: Not Asked   Other Topics Concern  . None   Social History Narrative  . None     Family History:  The patient's family history includes Hypertension in his mother; Stroke in his maternal grandmother.   ROS:   Please see the history of present illness.    ROS All other systems reviewed and are negative.   PHYSICAL EXAM:   VS:  BP 140/80   Pulse (!) 56   Ht 5\' 9"  (1.753 m)   Wt 183 lb (83 kg)   BMI 27.02 kg/m    GEN: Well nourished, well developed, in no acute distress  HEENT: normal  Neck: no JVD, carotid bruits, or masses Cardiac: RRR; no murmurs, rubs, or gallops,no edema.  Intact distal pulses bilaterally.  Respiratory:  clear to auscultation bilaterally, normal work of breathing GI: soft, nontender, nondistended, + BS MS: no deformity or atrophy  Skin: warm and dry, no rash Neuro:  Alert and Oriented x 3, Strength and sensation are intact Psych: euthymic mood, full affect  Wt Readings from Last 3 Encounters:  01/29/16 183 lb (83 kg)  05/14/15 189 lb (85.7 kg)  05/06/15 189 lb 9.6 oz (86 kg)      Studies/Labs Reviewed:   EKG:  EKG is not ordered today.    Recent Labs: No results found for requested labs within last 8760 hours.   Lipid Panel No results found for: CHOL, TRIG, HDL, CHOLHDL, VLDL, LDLCALC, LDLDIRECT  Additional studies/ records that were reviewed today include:  CPAP d/l    ASSESSMENT:    1. OSA (obstructive sleep apnea)   2. Essential hypertension, benign   3. Obesity (BMI 30-39.9)      PLAN:  In order of problems listed above:  OSA - the patient is tolerating PAP therapy well without any problems. The PAP download was reviewed today and showed an AHI of 4.6/hr on 9 cm H2O with 97% compliance in using more than 4 hours nightly.  The patient has been using and benefiting from CPAP use and will continue to benefit from therapy.  2.  HTN - BP controlled on  current meds.  Continue Cardizem/BB 3.  Obesity - I have encouraged him to get into a routine exercise program and cut back on carbs and portions.     Medication Adjustments/Labs and Tests Ordered: Current medicines are reviewed at length with the patient today.  Concerns regarding medicines are outlined above.  Medication changes, Labs and Tests ordered today are listed in the Patient Instructions below.  There are no Patient Instructions on file for this visit.   Signed, Armanda Magicraci Turner, MD  01/29/2016 8:37 AM    [facility] [address],  [place], [place]  [address] Phone: [phone]; Fax: [phone]

## 2016-01-29 ENCOUNTER — Encounter: Payer: Self-pay | Admitting: Cardiology

## 2016-01-29 ENCOUNTER — Ambulatory Visit (INDEPENDENT_AMBULATORY_CARE_PROVIDER_SITE_OTHER): Payer: BC Managed Care – PPO | Admitting: Cardiology

## 2016-01-29 VITALS — BP 140/80 | HR 56 | Ht 69.0 in | Wt 183.0 lb

## 2016-01-29 DIAGNOSIS — E669 Obesity, unspecified: Secondary | ICD-10-CM | POA: Diagnosis not present

## 2016-01-29 DIAGNOSIS — I10 Essential (primary) hypertension: Secondary | ICD-10-CM | POA: Diagnosis not present

## 2016-01-29 DIAGNOSIS — G4733 Obstructive sleep apnea (adult) (pediatric): Secondary | ICD-10-CM | POA: Diagnosis not present

## 2016-01-29 NOTE — Patient Instructions (Signed)

## 2016-02-03 ENCOUNTER — Ambulatory Visit: Payer: BC Managed Care – PPO | Admitting: Interventional Cardiology

## 2016-02-05 ENCOUNTER — Encounter: Payer: Self-pay | Admitting: Cardiology

## 2016-03-16 ENCOUNTER — Encounter: Payer: Self-pay | Admitting: Interventional Cardiology

## 2016-03-16 ENCOUNTER — Ambulatory Visit (INDEPENDENT_AMBULATORY_CARE_PROVIDER_SITE_OTHER): Payer: BC Managed Care – PPO | Admitting: Interventional Cardiology

## 2016-03-16 VITALS — BP 140/90 | HR 53 | Ht 69.0 in | Wt 185.8 lb

## 2016-03-16 DIAGNOSIS — Z5181 Encounter for therapeutic drug level monitoring: Secondary | ICD-10-CM

## 2016-03-16 DIAGNOSIS — I10 Essential (primary) hypertension: Secondary | ICD-10-CM | POA: Diagnosis not present

## 2016-03-16 DIAGNOSIS — Z79899 Other long term (current) drug therapy: Secondary | ICD-10-CM

## 2016-03-16 DIAGNOSIS — I480 Paroxysmal atrial fibrillation: Secondary | ICD-10-CM

## 2016-03-16 DIAGNOSIS — I484 Atypical atrial flutter: Secondary | ICD-10-CM | POA: Diagnosis not present

## 2016-03-16 DIAGNOSIS — Z7901 Long term (current) use of anticoagulants: Secondary | ICD-10-CM

## 2016-03-16 DIAGNOSIS — G4733 Obstructive sleep apnea (adult) (pediatric): Secondary | ICD-10-CM

## 2016-03-16 MED ORDER — FLECAINIDE ACETATE 100 MG PO TABS: 100.0000 mg | ORAL_TABLET | Freq: Two times a day (BID) | ORAL | 3 refills | Status: DC

## 2016-03-16 NOTE — Progress Notes (Signed)
Cardiology Office Note    Date:  03/16/2016   ID:  Anthony SabinaRoderick B Petitjean, DOB December 23, 1950, MRN [id number]  PCP:  Donovan KailOSS,ALLEN, MD  Cardiologist: Lesleigh NoeHenry W Leeloo Silverthorne III, MD   Chief Complaint  Patient presents with  . Coronary Artery Disease    History of Present Illness:  Anthony Cole is a 65 y.o. male who presents for obstructive sleep apnea, paroxysmal atrial fibrillation, hypertension, hyperlipidemia, and diabetes mellitus without complications  No symptomatic episodes of atrial fibrillation in over a year. He is tolerating flecainide without side effects. He has begun C PAP therapy for sleep apnea. He is not as active as previous. He denies chest pain. Relatively recent nuclear perfusion study was unremarkable.   Past Medical History:  Diagnosis Date  . Atrial fibrillation (HCC)   . Diabetes mellitus without complication (HCC)   . Hyperlipidemia   . Hypertension   . Hypothyroidism   . Obesity (BMI 30-39.9) 01/25/2016  . OSA (obstructive sleep apnea) 07/16/2014   Moderate with AHI 19/hr on CPAP at 8cm H2O    Past Surgical History:  Procedure Laterality Date  . CARDIOVERSION  11/04/2011   Procedure: CARDIOVERSION;  Surgeon: Lesleigh NoeHenry W Dilpreet Faires III, MD;  [facility];  Service: Cardiovascular;  Laterality: N/A;  . CARDIOVERSION N/A 07/02/2013   Procedure: CARDIOVERSION;  Surgeon: Lesleigh NoeHenry W Avrey Hyser III, MD;  [facility];  Service: Cardiovascular;  Laterality: N/A;  10:07 elective cardioversion Lido 40mg , Propofol 60mg ,IV...200 joules, synched, pt presently in A flutter,...cardioverted to NSR...  . CARDIOVERSION N/A 09/19/2013   Procedure: CARDIOVERSION;  Surgeon: Lesleigh NoeHenry W Williemae Muriel III, MD;  [facility];  Service: Cardiovascular;  Laterality: N/A;  . CARDIOVERSION N/A 08/13/2014   Procedure: CARDIOVERSION;  Surgeon: Lars MassonKatarina H Nelson, MD;  [facility];  Service: Cardiovascular;  Laterality: N/A;  . FINGER SURGERY Left   . KNEE ARTHROSCOPY W/ DEBRIDEMENT Bilateral     . ROTATOR CUFF REPAIR Bilateral   . STAPEDES SURGERY Right   . TONSILLECTOMY      Current Medications: Outpatient Medications Prior to Visit  Medication Sig Dispense Refill  . acyclovir (ZOVIRAX) 200 MG capsule Take 200 mg by mouth once as needed (first sign of fever blister).     [name]. acyclovir ointment (ZOVIRAX) 5 % Apply 1 application topically daily as needed (first sign of fever blister).    [name]. desonide (DESOWEN) 0.05 % lotion Apply 1 application topically 2 (two) times daily as needed (roscea).     . doxycycline (VIBRA-TABS) 100 MG tablet Take 100 mg by mouth daily as needed (for rosacea on face).     [name]. levothyroxine (SYNTHROID, LEVOTHROID) 88 MCG tablet Take 88 mcg by mouth daily before breakfast.     . metoprolol tartrate (LOPRESSOR) 25 MG tablet Take 12.5 mg by mouth daily after breakfast.     . Multiple Vitamin (MULITIVITAMIN WITH MINERALS) TABS Take 1 tablet by mouth every morning.    . rivaroxaban (XARELTO) 20 MG TABS tablet Take 1 tablet (20 mg total) by mouth daily. 90 tablet 3  . rosuvastatin (CRESTOR) 10 MG tablet Take 10 mg by mouth daily.     . SOOLANTRA 1 % CREA Take 1 application by mouth daily as needed (roscea).     . flecainide (TAMBOCOR) 100 MG tablet Take 100 mg by mouth 2 (two) times daily.    [name]. diltiazem (CARDIZEM) 30 MG tablet Take 30 mg by mouth 4 (four) times daily. Pt takes as needed     No facility-administered  medications prior to visit.      Allergies:   Gabapentin; Other; and Glucosamine forte [nutritional supplements]   Social History   Social History  . Marital status: Single    Spouse name: N/A  . Number of children: N/A  . Years of education: N/A   Social History Main Topics  . Smoking status: Never Smoker  . Smokeless tobacco: Never Used  . Alcohol use No  . Drug use: No  . Sexual activity: Not Asked   Other Topics Concern  . None   Social History Narrative  . None     Family History:  The patient's family history includes  Hypertension in his mother; Stroke in his maternal grandmother.   ROS:   Please see the history of present illness.    Anxiety.  All other systems reviewed and are negative.   PHYSICAL EXAM:   VS:  BP 140/90 (BP Location: Left Arm, Patient Position: Sitting, Cuff Size: Normal)   Pulse (!) 53   Ht 5\' 9"  (1.753 m)   Wt 185 lb 12.8 oz (84.3 kg)   BMI 27.44 kg/m    GEN: Well nourished, well developed, in no acute distress  HEENT: normal  Neck: no JVD, carotid bruits, or masses Cardiac: RRR; no murmurs, rubs, or gallops,no edema  Respiratory:  clear to auscultation bilaterally, normal work of breathing GI: soft, nontender, nondistended, + BS MS: no deformity or atrophy  Skin: warm and dry, no rash Neuro:  Alert and Oriented x 3, Strength and sensation are intact Psych: euthymic mood, full affect  Wt Readings from Last 3 Encounters:  03/16/16 185 lb 12.8 oz (84.3 kg)  01/29/16 183 lb (83 kg)  05/14/15 189 lb (85.7 kg)      Studies/Labs Reviewed:   EKG:  EKG  Sinus bradycardia at 53 bpm. Nonspecific T-wave abnormality.  Recent Labs: No results found for requested labs within last 8760 hours.   Lipid Panel No results found for: CHOL, TRIG, HDL, CHOLHDL, VLDL, LDLCALC, LDLDIRECT  Additional studies/ records that were reviewed today include:  No new data.    ASSESSMENT:    1. Atypical atrial flutter (HCC)   2. Paroxysmal atrial fibrillation (HCC)   3. Essential hypertension, benign   4. OSA (obstructive sleep apnea)   5. Encounter for monitoring flecainide therapy   6. Long term current use of anticoagulant therapy      PLAN:  In order of problems listed above:  1. No recurrence on flecainide. Status post ablation at [facility]. 2. No recurrence on flecainide. 3. Borderline blood pressure elevation. Target is less than 140/90 mmHg. He will monitor the blood pressure at least 3 times over the next 12 months. If consistently above these levels, we will add low dose ACE  inhibitor therapy, perhaps lisinopril 5 or 10 mg per day. 4. Continue C Pap therapy for sleep apnea 5. No obvious flecainide side effects.    Medication Adjustments/Labs and Tests Ordered: Current medicines are reviewed at length with the patient today.  Concerns regarding medicines are outlined above.  Medication changes, Labs and Tests ordered today are listed in the Patient Instructions below. Patient Instructions  Medication Instructions:  None  Labwork: None  Testing/Procedures: None  Follow-Up: Your physician wants you to follow-up in: 1 year with Dr. Katrinka Blazing. You will receive a reminder letter in the mail two months in advance. If you don't receive a letter, please call our office to schedule the follow-up appointment.   Any Other Special Instructions  Will Be Listed Below (If Applicable).  Monitor your blood pressure at home.  If your blood pressure is consistently 140/90 or greater, please contact our office.   If you need a refill on your cardiac medications before your next appointment, please call your pharmacy.      Signed, Lesleigh Noe, MD  03/16/2016 10:02 AM    [facility] [address] [place], [place], [place]  [address] Phone: [phone]; Fax: [phone]

## 2016-03-16 NOTE — Patient Instructions (Signed)
Medication Instructions:  None  Labwork: None  Testing/Procedures: None  Follow-Up: Your physician wants you to follow-up in: 1 year with Dr. Katrinka BlazingSmith. You will receive a reminder letter in the mail two months in advance. If you don't receive a letter, please call our office to schedule the follow-up appointment.   Any Other Special Instructions Will Be Listed Below (If Applicable).  Monitor your blood pressure at home.  If your blood pressure is consistently 140/90 or greater, please contact our office.   If you need a refill on your cardiac medications before your next appointment, please call your pharmacy.

## 2016-04-12 ENCOUNTER — Encounter (INDEPENDENT_AMBULATORY_CARE_PROVIDER_SITE_OTHER): Payer: BC Managed Care – PPO | Admitting: Ophthalmology

## 2016-04-12 DIAGNOSIS — H33023 Retinal detachment with multiple breaks, bilateral: Secondary | ICD-10-CM

## 2016-04-12 DIAGNOSIS — H35372 Puckering of macula, left eye: Secondary | ICD-10-CM

## 2016-04-12 DIAGNOSIS — H353111 Nonexudative age-related macular degeneration, right eye, early dry stage: Secondary | ICD-10-CM | POA: Diagnosis not present

## 2016-04-12 DIAGNOSIS — H4311 Vitreous hemorrhage, right eye: Secondary | ICD-10-CM

## 2016-04-12 DIAGNOSIS — H43813 Vitreous degeneration, bilateral: Secondary | ICD-10-CM | POA: Diagnosis not present

## 2016-04-14 ENCOUNTER — Encounter (INDEPENDENT_AMBULATORY_CARE_PROVIDER_SITE_OTHER): Payer: BC Managed Care – PPO | Admitting: Ophthalmology

## 2016-04-27 ENCOUNTER — Encounter (INDEPENDENT_AMBULATORY_CARE_PROVIDER_SITE_OTHER): Payer: BC Managed Care – PPO | Admitting: Ophthalmology

## 2016-04-27 DIAGNOSIS — H33023 Retinal detachment with multiple breaks, bilateral: Secondary | ICD-10-CM

## 2016-05-12 ENCOUNTER — Ambulatory Visit (INDEPENDENT_AMBULATORY_CARE_PROVIDER_SITE_OTHER): Payer: BC Managed Care – PPO | Admitting: Ophthalmology

## 2016-05-12 DIAGNOSIS — H33303 Unspecified retinal break, bilateral: Secondary | ICD-10-CM

## 2016-07-08 ENCOUNTER — Other Ambulatory Visit: Payer: Self-pay | Admitting: *Deleted

## 2016-07-08 ENCOUNTER — Other Ambulatory Visit: Payer: Self-pay | Admitting: Interventional Cardiology

## 2016-07-08 DIAGNOSIS — I480 Paroxysmal atrial fibrillation: Secondary | ICD-10-CM

## 2016-07-08 DIAGNOSIS — Z7901 Long term (current) use of anticoagulants: Secondary | ICD-10-CM

## 2016-07-09 ENCOUNTER — Other Ambulatory Visit: Payer: BC Managed Care – PPO | Admitting: *Deleted

## 2016-07-09 DIAGNOSIS — I480 Paroxysmal atrial fibrillation: Secondary | ICD-10-CM

## 2016-07-09 DIAGNOSIS — Z7901 Long term (current) use of anticoagulants: Secondary | ICD-10-CM

## 2016-07-09 LAB — BASIC METABOLIC PANEL
BUN/Creatinine Ratio: 24 (ref 10–24)
BUN: 20 mg/dL (ref 8–27)
CO2: 27 mmol/L (ref 18–29)
CREATININE: 0.84 mg/dL (ref 0.76–1.27)
Calcium: 8.8 mg/dL (ref 8.6–10.2)
Chloride: 99 mmol/L (ref 96–106)
GFR calc Af Amer: 107 mL/min/{1.73_m2} (ref >59)
GFR, EST NON AFRICAN AMERICAN: 93 mL/min/{1.73_m2} (ref >59)
GLUCOSE: 136 mg/dL — AB (ref 65–99)
Potassium: 3.8 mmol/L (ref 3.5–5.2)
Sodium: 139 mmol/L (ref 134–144)

## 2016-07-09 LAB — CBC
HEMATOCRIT: 40.5 % (ref 37.5–51.0)
HEMOGLOBIN: 13.7 g/dL (ref 13.0–17.7)
MCH: 30.4 pg (ref 26.6–33.0)
MCHC: 33.8 g/dL (ref 31.5–35.7)
MCV: 90 fL (ref 79–97)
Platelets: 243 10*3/uL (ref 150–379)
RBC: 4.5 x10E6/uL (ref 4.14–5.80)
RDW: 13.6 % (ref 12.3–15.4)
WBC: 8.9 10*3/uL (ref 3.4–10.8)

## 2016-07-12 NOTE — Telephone Encounter (Signed)
Age 66 Wt 84.3kg Saw Dr Katrinka BlazingSmith 03/16/2016  07/09/2016 SrCr 0.84 07/09/2016 Hgb 13.7 HCT 40.5  CrCl 105.9  Refill of Xarelto 20 mg daily done

## 2016-09-15 ENCOUNTER — Ambulatory Visit (INDEPENDENT_AMBULATORY_CARE_PROVIDER_SITE_OTHER): Payer: BC Managed Care – PPO | Admitting: Ophthalmology

## 2016-09-15 DIAGNOSIS — H33303 Unspecified retinal break, bilateral: Secondary | ICD-10-CM

## 2016-09-15 DIAGNOSIS — H353131 Nonexudative age-related macular degeneration, bilateral, early dry stage: Secondary | ICD-10-CM

## 2016-09-15 DIAGNOSIS — H35033 Hypertensive retinopathy, bilateral: Secondary | ICD-10-CM

## 2016-09-15 DIAGNOSIS — I10 Essential (primary) hypertension: Secondary | ICD-10-CM

## 2016-09-15 DIAGNOSIS — H2513 Age-related nuclear cataract, bilateral: Secondary | ICD-10-CM

## 2016-09-21 ENCOUNTER — Ambulatory Visit (INDEPENDENT_AMBULATORY_CARE_PROVIDER_SITE_OTHER): Payer: BC Managed Care – PPO | Admitting: Interventional Cardiology

## 2016-09-21 ENCOUNTER — Encounter: Payer: Self-pay | Admitting: Interventional Cardiology

## 2016-09-21 VITALS — BP 152/88 | HR 61 | Ht 69.0 in | Wt 194.0 lb

## 2016-09-21 DIAGNOSIS — Z7901 Long term (current) use of anticoagulants: Secondary | ICD-10-CM

## 2016-09-21 DIAGNOSIS — I480 Paroxysmal atrial fibrillation: Secondary | ICD-10-CM | POA: Diagnosis not present

## 2016-09-21 DIAGNOSIS — I10 Essential (primary) hypertension: Secondary | ICD-10-CM

## 2016-09-21 DIAGNOSIS — Z79899 Other long term (current) drug therapy: Secondary | ICD-10-CM | POA: Diagnosis not present

## 2016-09-21 DIAGNOSIS — I484 Atypical atrial flutter: Secondary | ICD-10-CM | POA: Diagnosis not present

## 2016-09-21 DIAGNOSIS — Z5181 Encounter for therapeutic drug level monitoring: Secondary | ICD-10-CM

## 2016-09-21 NOTE — Progress Notes (Signed)
Cardiology Office Note    Date:  09/21/2016   ID:  [name], DOB [birth year], MRN [id number]  PCP:  Duane Lope, MD  Cardiologist: Lesleigh Noe, MD   Chief Complaint  Patient presents with  . Atrial Fibrillation    History of Present Illness:  Anthony Cole is a 66 y.o. male who presents for obstructive sleep apnea, paroxysmal atrial fibrillation, hypertension, hyperlipidemia, and diabetes mellitus without complications   He has had difficulty with retinal tears. No hemorrhage. No episodes of atrial fibrillation. Overall feels well.  Past Medical History:  Diagnosis Date  . Atrial fibrillation (HCC)   . Diabetes mellitus without complication (HCC)   . Hyperlipidemia   . Hypertension   . Hypothyroidism   . Obesity (BMI 30-39.9) 01/25/2016  . OSA (obstructive sleep apnea) 07/16/2014   Moderate with AHI 19/hr on CPAP at 8cm H2O    Past Surgical History:  Procedure Laterality Date  . CARDIOVERSION  11/04/2011   Procedure: CARDIOVERSION;  Surgeon: Lesleigh Noe, MD;  Location: [facility] OR;  Service: Cardiovascular;  Laterality: N/A;  . CARDIOVERSION N/A 07/02/2013   Procedure: CARDIOVERSION;  Surgeon: Lesleigh Noe, MD;  Location: [facility] ENDOSCOPY;  Service: Cardiovascular;  Laterality: N/A;  10:07 elective cardioversion Lido , Propofol ,IV...200 joules, synched, pt presently in A flutter,...cardioverted to NSR...  . CARDIOVERSION N/A 09/19/2013   Procedure: CARDIOVERSION;  Surgeon: Lesleigh Noe, MD;  Location: [facility] ENDOSCOPY;  Service: Cardiovascular;  Laterality: N/A;  . CARDIOVERSION N/A 08/13/2014   Procedure: CARDIOVERSION;  Surgeon: Lars Masson, MD;  Location: [facility] ENDOSCOPY;  Service: Cardiovascular;  Laterality: N/A;  . FINGER SURGERY Left   . KNEE ARTHROSCOPY W/ DEBRIDEMENT Bilateral   . ROTATOR CUFF REPAIR Bilateral   . STAPEDES SURGERY Right   . TONSILLECTOMY      Current Medications: Outpatient Medications Prior to Visit  Medication Sig  Dispense Refill  . acyclovir (ZOVIRAX) 200 MG capsule Take 200 mg by mouth once as needed (first sign of fever blister).     [name] acyclovir ointment (ZOVIRAX) 5 % Apply 1 application topically daily as needed (first sign of fever blister).    [name] desonide (DESOWEN) 0.05 % lotion Apply 1 application topically 2 (two) times daily as needed (roscea).     . diltiazem (CARDIZEM) 30 MG tablet Take 30 mg by mouth 4 (four) times daily. Pt takes as needed    . doxycycline (VIBRA-TABS) 100 MG tablet Take 100 mg by mouth daily as needed (for rosacea on face).     . flecainide (TAMBOCOR) 100 MG tablet Take 1 tablet (100 mg total) by mouth 2 (two) times daily. 180 tablet 3  . levothyroxine (SYNTHROID, LEVOTHROID) 88 MCG tablet Take 88 mcg by mouth daily before breakfast.     . metoprolol tartrate (LOPRESSOR) 25 MG tablet Take 12.5 mg by mouth daily after breakfast.     . Multiple Vitamin (MULITIVITAMIN WITH MINERALS) TABS Take 1 tablet by mouth every morning.    . rosuvastatin (CRESTOR) 10 MG tablet Take 10 mg by mouth daily.     . SOOLANTRA 1 % CREA Take 1 application by mouth daily as needed (roscea).     [name] 20 MG TABS tablet TAKE 1 TABLET ONCE DAILY. 90 tablet 1   No facility-administered medications prior to visit.      Allergies:   Gabapentin; Other; and Glucosamine forte [nutritional supplements]   Social History   Social History  .  Marital status: Single    Spouse name: N/A  . Number of children: N/A  . Years of education: N/A   Social History Main Topics  . Smoking status: Never Smoker  . Smokeless tobacco: Never Used  . Alcohol use No  . Drug use: No  . Sexual activity: Not on file   Other Topics Concern  . Not on file   Social History Narrative  . No narrative on file     Family History:  The patient's family history includes Hypertension in his mother; Stroke in his maternal grandmother.   ROS:   Please see the history of present illness.    Retinal breaks. No dyspnea.  Weight gain.  All other systems reviewed and are negative.   PHYSICAL EXAM:   VS:  There were no vitals taken for this visit.   GEN: Well nourished, well developed, in no acute distress  HEENT: normal  Neck: no JVD, carotid bruits, or masses Cardiac: RRR; no murmurs, rubs, or gallops,no edema  Respiratory:  clear to auscultation bilaterally, normal work of breathing GI: soft, nontender, nondistended, + BS MS: no deformity or atrophy  Skin: warm and dry, no rash Neuro:  Alert and Oriented x 3, Strength and sensation are intact Psych: euthymic mood, full affect  Wt Readings from Last 3 Encounters:  03/16/16 185 lb 12.8 oz (84.3 kg)  01/29/16 183 lb (83 kg)  05/14/15 189 lb (85.7 kg)      Studies/Labs Reviewed:   EKG:  EKG  Not repeated  Recent Labs: 07/09/2016: BUN 20; Creatinine, Ser 0.84; Platelets 243; Potassium 3.8; Sodium 139   Lipid Panel No results found for: CHOL, TRIG, HDL, CHOLHDL, VLDL, LDLCALC, LDLDIRECT  Additional studies/ records that were reviewed today include:  No imaging or functional data recently.    ASSESSMENT:    1. Atypical atrial flutter (HCC)   2. Essential hypertension, benign   3. Paroxysmal atrial fibrillation (HCC)   4. Encounter for monitoring flecainide therapy   5. Long term current use of anticoagulant therapy      PLAN:  In order of problems listed above:  1. Clinically in sinus rhythm and maintained on flecainide 100 mg 2 times daily 2. Blood pressures well controlled with repeat of 142/82. It is trending up, and therefore recommended decrease salt, exercise, and weight loss. Ideal body weight is around 180. 3. Controlled on flecainide.  4. No medication side effects or complaints. 5. No bleeding complications on Xarelto.  Clinical follow-up in 6 months. Weight loss, aerobic exercise, and sodium restriction. Follow-up blood pressure in 6 months. Record values as an OP.    Medication Adjustments/Labs and Tests  Ordered: Current medicines are reviewed at length with the patient today.  Concerns regarding medicines are outlined above.  Medication changes, Labs and Tests ordered today are listed in the Patient Instructions below. There are no Patient Instructions on file for this visit.   Signed, Lesleigh Noe, MD  09/21/2016 9:06 AM    [facility] [address] [place], [place], [place]  [address] Phone: [phone]; Fax: [phone]

## 2016-09-21 NOTE — Patient Instructions (Signed)
Medication Instructions:  None  Labwork: None  Testing/Procedures: None  Follow-Up: Your physician wants you to follow-up in: 6 months with Dr. Katrinka Blazing. You will receive a reminder letter in the mail two months in advance. If you don't receive a letter, please call our office to schedule the follow-up appointment.   Any Other Special Instructions Will Be Listed Below (If Applicable).  Your physician recommends that you decrease the salt in your diet and exercise.    If you need a refill on your cardiac medications before your next appointment, please call your pharmacy.

## 2016-10-25 ENCOUNTER — Telehealth: Payer: Self-pay

## 2016-10-25 NOTE — Telephone Encounter (Signed)
Left message to call back  

## 2016-10-25 NOTE — Telephone Encounter (Signed)
Patient walk-in about two concerns.  1- Patient was given a prescription by Dr. 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 Patient wants to make sure it is okay for him to take this medications with his current medications.  2- Patient wanted to know if it was okay for Dr. Katrinka BlazingSmith to refill his Metoprolol, since he did not initially start him on this medication. Informed patient that Dr. Katrinka BlazingSmith should be fine refilling any of his cardiac medications. Patient stated if he is fine with refilling, to have it sent to [facility].  Will forward to Dr. Katrinka BlazingSmith and his nurse. Patient also wanted Dr. Katrinka BlazingSmith to have a hard copy of lab work. Will put lab work in Dr. Michaelle CopasSmith's mailbox.

## 2016-10-25 NOTE — Telephone Encounter (Signed)
It is OK to refill metoprolol.  It is okay to use AndroGel.

## 2016-10-26 ENCOUNTER — Other Ambulatory Visit: Payer: Self-pay | Admitting: Interventional Cardiology

## 2016-10-26 MED ORDER — METOPROLOL SUCCINATE ER 25 MG PO TB24: 12.5000 mg | ORAL_TABLET | Freq: Every day | ORAL | 3 refills | Status: DC

## 2016-10-26 MED ORDER — METOPROLOL TARTRATE 25 MG PO TABS: 12.5000 mg | ORAL_TABLET | Freq: Every day | ORAL | 3 refills | Status: DC

## 2016-10-26 NOTE — Telephone Encounter (Signed)
Called patient with Dr. Michaelle CopasSmith's message. Informed patient that it is okay to take AndroGel, and his metoprolol will be sent to his pharmacy of choice. Patient verbalized understanding.

## 2017-01-10 ENCOUNTER — Other Ambulatory Visit: Payer: Self-pay | Admitting: Interventional Cardiology

## 2017-01-10 NOTE — Telephone Encounter (Signed)
Pt saw Dr Katrinka BlazingSmith 09/2016. CrCl 109.8113mL/min. Continue Xarelto 20mg  QD. SCr 0.84 07/2016.

## 2017-01-28 ENCOUNTER — Encounter (INDEPENDENT_AMBULATORY_CARE_PROVIDER_SITE_OTHER): Payer: Medicare Other | Admitting: Ophthalmology

## 2017-01-28 DIAGNOSIS — H35033 Hypertensive retinopathy, bilateral: Secondary | ICD-10-CM

## 2017-01-28 DIAGNOSIS — H353121 Nonexudative age-related macular degeneration, left eye, early dry stage: Secondary | ICD-10-CM | POA: Diagnosis not present

## 2017-01-28 DIAGNOSIS — I10 Essential (primary) hypertension: Secondary | ICD-10-CM | POA: Diagnosis not present

## 2017-01-28 DIAGNOSIS — H353112 Nonexudative age-related macular degeneration, right eye, intermediate dry stage: Secondary | ICD-10-CM

## 2017-01-28 DIAGNOSIS — H43813 Vitreous degeneration, bilateral: Secondary | ICD-10-CM | POA: Diagnosis not present

## 2017-01-28 DIAGNOSIS — H33333 Multiple defects of retina without detachment, bilateral: Secondary | ICD-10-CM

## 2017-02-21 ENCOUNTER — Ambulatory Visit (INDEPENDENT_AMBULATORY_CARE_PROVIDER_SITE_OTHER): Payer: Medicare Other | Admitting: Interventional Cardiology

## 2017-02-21 ENCOUNTER — Encounter: Payer: Self-pay | Admitting: Interventional Cardiology

## 2017-02-21 VITALS — BP 146/86 | HR 55 | Ht 69.0 in | Wt 174.6 lb

## 2017-02-21 DIAGNOSIS — Z5181 Encounter for therapeutic drug level monitoring: Secondary | ICD-10-CM

## 2017-02-21 DIAGNOSIS — G4733 Obstructive sleep apnea (adult) (pediatric): Secondary | ICD-10-CM

## 2017-02-21 DIAGNOSIS — I480 Paroxysmal atrial fibrillation: Secondary | ICD-10-CM | POA: Diagnosis not present

## 2017-02-21 DIAGNOSIS — I10 Essential (primary) hypertension: Secondary | ICD-10-CM | POA: Diagnosis not present

## 2017-02-21 DIAGNOSIS — Z79899 Other long term (current) drug therapy: Secondary | ICD-10-CM

## 2017-02-21 DIAGNOSIS — Z7901 Long term (current) use of anticoagulants: Secondary | ICD-10-CM

## 2017-02-21 MED ORDER — LOSARTAN POTASSIUM 25 MG PO TABS: 25.0000 mg | ORAL_TABLET | Freq: Every day | ORAL | 3 refills | Status: DC

## 2017-02-21 NOTE — Patient Instructions (Signed)
Medication Instructions:  1) START Losartan  once daily  Labwork: None  Testing/Procedures: None  Follow-Up: Your physician recommends that you schedule a follow-up appointment in: 1 month with our Hypertension clinic.   Your physician wants you to follow-up in: 6 months with Dr. Katrinka Blazing.  You will receive a reminder letter in the mail two months in advance. If you don't receive a letter, please call our office to schedule the follow-up appointment.   Any Other Special Instructions Will Be Listed Below (If Applicable).  Your target blood pressure is 130/90 or less.    If you need a refill on your cardiac medications before your next appointment, please call your pharmacy.

## 2017-02-21 NOTE — Progress Notes (Signed)
Cardiology Office Note    Date:  02/21/2017   ID:  Anthony Cole, DOB June 16, 1950, MRN [id number]  PCP:  Daisy Floro, MD  Cardiologist: Lesleigh Noe, MD    Chief Complaint  Patient presents with  . Coronary Artery Disease  . Atrial Fibrillation    History of Present Illness:  Anthony Cole is a 66 y.o. male who presents for obstructive sleep apnea, paroxysmal atrial fibrillation, hypertension, hyperlipidemia, and diabetes mellitus without complications  He is doing well. He is started androgen replacement therapy. No chest discomfort, palpitations, orthopnea, edema, or other complaints. No episodes of syncope. He denies fatigue and exertional intolerance.   Past Medical History:  Diagnosis Date  . Atrial fibrillation (HCC)   . Diabetes mellitus without complication (HCC)   . Hyperlipidemia   . Hypertension   . Hypothyroidism   . Obesity (BMI 30-39.9) 01/25/2016  . OSA (obstructive sleep apnea) 07/16/2014   Moderate with AHI 19/hr on CPAP at 8cm H2O    Past Surgical History:  Procedure Laterality Date  . CARDIOVERSION  11/04/2011   Procedure: CARDIOVERSION;  Surgeon: Lesleigh Noe, MD;  Location: [facility] OR;  Service: Cardiovascular;  Laterality: N/A;  . CARDIOVERSION N/A 07/02/2013   Procedure: CARDIOVERSION;  Surgeon: Lesleigh Noe, MD;  Location: [facility] ENDOSCOPY;  Service: Cardiovascular;  Laterality: N/A;  10:07 elective cardioversion Lido , Propofol ,IV...200 joules, synched, pt presently in A flutter,...cardioverted to NSR...  . CARDIOVERSION N/A 09/19/2013   Procedure: CARDIOVERSION;  Surgeon: Lesleigh Noe, MD;  Location: [facility] ENDOSCOPY;  Service: Cardiovascular;  Laterality: N/A;  . CARDIOVERSION N/A 08/13/2014   Procedure: CARDIOVERSION;  Surgeon: Lars Masson, MD;  Location: [facility] ENDOSCOPY;  Service: Cardiovascular;  Laterality: N/A;  . FINGER SURGERY Left   . KNEE ARTHROSCOPY W/ DEBRIDEMENT Bilateral   . ROTATOR CUFF REPAIR Bilateral    . STAPEDES SURGERY Right   . TONSILLECTOMY      Current Medications: Outpatient Medications Prior to Visit  Medication Sig Dispense Refill  . acyclovir (ZOVIRAX) 200 MG capsule Take 200 mg by mouth once as needed (first sign of fever blister).     [name] acyclovir ointment (ZOVIRAX) 5 % Apply 1 application topically daily as needed (first sign of fever blister).    [name] doxycycline (VIBRA-TABS) 100 MG tablet Take 100 mg by mouth 2 (two) times daily as needed (rosacea).    . flecainide (TAMBOCOR) 100 MG tablet Take 1 tablet (100 mg total) by mouth 2 (two) times daily. 180 tablet 3  . levothyroxine (SYNTHROID, LEVOTHROID) 88 MCG tablet Take 88 mcg by mouth daily before breakfast.     . metoprolol succinate (TOPROL XL) 25 MG 24 hr tablet Take 0.5 tablets (12.5 mg total) by mouth daily. 45 tablet 3  . rosuvastatin (CRESTOR) 10 MG tablet Take 10 mg by mouth daily.     [name] 20 MG TABS tablet TAKE 1 TABLET ONCE DAILY. 90 tablet 2  . desonide (DESOWEN) 0.05 % lotion Apply 1 application topically 2 (two) times daily as needed (roscea).     . Multiple Vitamin (MULITIVITAMIN WITH MINERALS) TABS Take 1 tablet by mouth every morning.    . SOOLANTRA 1 % CREA Take 1 application by mouth daily as needed (roscea).      No facility-administered medications prior to visit.      Allergies:   Gabapentin; Other; and Glucosamine forte [nutritional supplements]   Social History   Social History  .  Marital status: Single    Spouse name: N/A  . Number of children: N/A  . Years of education: N/A   Social History Main Topics  . Smoking status: Never Smoker  . Smokeless tobacco: Never Used  . Alcohol use No  . Drug use: No  . Sexual activity: Not Asked   Other Topics Concern  . None   Social History Narrative  . None     Family History:  The patient's family history includes Hypertension in his mother; Stroke in his maternal grandmother.   ROS:   Please see the history of present illness.      Significant weight loss on a diet, greater than 20 pounds over the past 5 months. He now sees a nutritionist.  All other systems reviewed and are negative.   PHYSICAL EXAM:   VS:  BP (!) 146/86 (BP Location: Left Arm)   Pulse (!) 55   Ht  (1.753 m)   Wt 174 lb 9.6 oz (79.2 kg)   BMI 25.78 kg/m    GEN: Well nourished, well developed, in no acute distress  HEENT: normal  Neck: no JVD, carotid bruits, or masses Cardiac: RRR; no murmurs, rubs, or gallops,no edema  Respiratory:  clear to auscultation bilaterally, normal work of breathing GI: soft, nontender, nondistended, + BS MS: no deformity or atrophy  Skin: warm and dry, no rash Neuro:  Alert and Oriented x 3, Strength and sensation are intact Psych: euthymic mood, full affect  Wt Readings from Last 3 Encounters:  02/21/17 174 lb 9.6 oz (79.2 kg)  09/21/16 194 lb (88 kg)  03/16/16 185 lb 12.8 oz (84.3 kg)      Studies/Labs Reviewed:   EKG:  EKG  Sinus bradycardia, nonspecific ST-T abnormality. First-degree AV block. No change compared to prior.  Recent Labs: 07/09/2016: BUN 20; Creatinine, Ser 0.84; Hemoglobin 13.7; Platelets 243; Potassium 3.8; Sodium 139   Lipid Panel No results found for: CHOL, TRIG, HDL, CHOLHDL, VLDL, LDLCALC, LDLDIRECT  Additional studies/ records that were reviewed today include:  No new data    ASSESSMENT:    1. Paroxysmal atrial fibrillation (HCC)   2. Essential hypertension, benign   3. OSA (obstructive sleep apnea)   4. Encounter for monitoring flecainide therapy   5. Long term current use of anticoagulant therapy      PLAN:  In order of problems listed above:  1. Flecainide is controlling atrial fibrillation recurrences quite nicely. No change in therapy at this time. 2. Elevated. Goal is systolic blood pressure less than 130/85 mmHg. Start losartan 25 mg daily. I will usually start the slow but he was adamant about starting the absolutely lowest dose possible. Blood pressure  clinic follow-up in one month. Would need 50-100 mg of losartan most likely but we will see 3. Continue CPAP 4. No complications from flecainide. 5. No bleeding complications.  Overall doing well. Excellent weight loss. Six-month follow-up. Blood pressure clinic in one month. Monitor losartan and uptitrate as needed to reach goal less than 130/85 mmHg.    Medication Adjustments/Labs and Tests Ordered: Current medicines are reviewed at length with the patient today.  Concerns regarding medicines are outlined above.  Medication changes, Labs and Tests ordered today are listed in the Patient Instructions below. There are no Patient Instructions on file for this visit.   Signed, Lesleigh Noe, MD  02/21/2017 1:26 PM    [facility] [address] [place], [place], [place]  [address] Phone: [phone]; Fax: [phone]

## 2017-03-23 ENCOUNTER — Ambulatory Visit (INDEPENDENT_AMBULATORY_CARE_PROVIDER_SITE_OTHER): Payer: Medicare Other | Admitting: Pharmacist

## 2017-03-23 VITALS — BP 138/84 | HR 52

## 2017-03-23 DIAGNOSIS — I10 Essential (primary) hypertension: Secondary | ICD-10-CM | POA: Diagnosis not present

## 2017-03-23 LAB — BASIC METABOLIC PANEL
BUN/Creatinine Ratio: 22 (ref 10–24)
BUN: 21 mg/dL (ref 8–27)
CHLORIDE: 97 mmol/L (ref 96–106)
CO2: 26 mmol/L (ref 20–29)
Calcium: 9.6 mg/dL (ref 8.6–10.2)
Creatinine, Ser: 0.96 mg/dL (ref 0.76–1.27)
GFR calc non Af Amer: 83 mL/min/{1.73_m2} (ref >59)
GFR, EST AFRICAN AMERICAN: 95 mL/min/{1.73_m2} (ref >59)
GLUCOSE: 96 mg/dL (ref 65–99)
POTASSIUM: 4.2 mmol/L (ref 3.5–5.2)
SODIUM: 142 mmol/L (ref 134–144)

## 2017-03-23 MED ORDER — LOSARTAN POTASSIUM 50 MG PO TABS: 50.0000 mg | ORAL_TABLET | Freq: Every day | ORAL | 11 refills | Status: DC

## 2017-03-23 NOTE — Patient Instructions (Addendum)
It was nice to see you today  Increase your losartan to 50mg  daily. You can take 2 of your 25mg  tablets each day. Once you run out, I will send in a new prescription for the higher 50mg  dose to start taking once a day  Continue taking all of your other medications  Keep your follow up with Dr Mayford Knifeurner on 11/7. We will check lab work this day  Follow up in blood pressure clinic if needed
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
## 2017-03-29 ENCOUNTER — Encounter: Payer: Self-pay | Admitting: Cardiology

## 2017-04-10 ENCOUNTER — Encounter: Payer: Self-pay | Admitting: Cardiology

## 2017-04-13 ENCOUNTER — Encounter: Payer: Self-pay | Admitting: Cardiology

## 2017-04-13 ENCOUNTER — Ambulatory Visit: Payer: Medicare Other | Admitting: Cardiology

## 2017-04-13 ENCOUNTER — Other Ambulatory Visit: Payer: Medicare Other

## 2017-04-13 VITALS — BP 118/72 | HR 58 | Ht 69.0 in | Wt 174.6 lb

## 2017-04-13 DIAGNOSIS — I10 Essential (primary) hypertension: Secondary | ICD-10-CM

## 2017-04-13 DIAGNOSIS — G4733 Obstructive sleep apnea (adult) (pediatric): Secondary | ICD-10-CM | POA: Diagnosis not present

## 2017-04-13 LAB — BASIC METABOLIC PANEL
BUN/Creatinine Ratio: 19 (ref 10–24)
BUN: 17 mg/dL (ref 8–27)
CHLORIDE: 99 mmol/L (ref 96–106)
CO2: 28 mmol/L (ref 20–29)
Calcium: 10 mg/dL (ref 8.6–10.2)
Creatinine, Ser: 0.88 mg/dL (ref 0.76–1.27)
GFR calc Af Amer: 104 mL/min/{1.73_m2} (ref >59)
GFR calc non Af Amer: 90 mL/min/{1.73_m2} (ref >59)
Glucose: 102 mg/dL — ABNORMAL HIGH (ref 65–99)
POTASSIUM: 4.5 mmol/L (ref 3.5–5.2)
Sodium: 142 mmol/L (ref 134–144)

## 2017-04-13 NOTE — Patient Instructions (Signed)
Medication Instructions:  Your physician recommends that you continue on your current medications as directed. Please refer to the Current Medication list given to you today.   Labwork: Today for basic metabolic panel   Testing/Procedures: None ordered   Follow-Up: Your physician wants you to follow-up in: 1 year with Dr. Turner. You will receive a reminder letter in the mail two months in advance. If you don't receive a letter, please call our office to schedule the follow-up appointment.   Any Other Special Instructions Will Be Listed Below (If Applicable).     If you need a refill on your cardiac medications before your next appointment, please call your pharmacy.  

## 2017-04-13 NOTE — Progress Notes (Signed)
Cardiology Office Note:    Date:  04/13/2017   ID:  Anthony SabinaRoderick B Baris, DOB 1951-06-04, MRN [id number]  PCP:  Daisy Florooss, Charles Alan, MD  Cardiologist:  Verdis PrimeHenry Smith, MD   Referring MD: Daisy Florooss, Charles Alan, MD   Chief Complaint  Patient presents with  . Sleep Apnea  . Hypertension    History of Present Illness:    Anthony Cole is a 66 y.o. male with a hx of moderate OSA with an AHI of 19 events per hour. He is on CPAP at 8cm H2O.  He is doing well with his CPAP device.[name].  He tolerates the full face mask and feels the pressure is adequate.  Since going on CPAP he feels rested in the am and has no significant daytime sleepiness although occasionally he will nap during the day.  He has some mouth dryness but uses Biotene mouth wash.    He does not think that he snores.    Past Medical History:  Diagnosis Date  . Atrial fibrillation (HCC)   . Diabetes mellitus without complication (HCC)   . Hyperlipidemia   . Hypertension   . Hypothyroidism   . Obesity (BMI 30-39.9) 01/25/2016  . OSA (obstructive sleep apnea) 07/16/2014   Moderate with AHI 19/hr on CPAP at 8cm H2O    Past Surgical History:  Procedure Laterality Date  . FINGER SURGERY Left   . KNEE ARTHROSCOPY W/ DEBRIDEMENT Bilateral   . ROTATOR CUFF REPAIR Bilateral   . STAPEDES SURGERY Right   . TONSILLECTOMY      Current Medications: Current Meds  Medication Sig  . acyclovir (ZOVIRAX) 200 MG capsule Take 200 mg by mouth once as needed (first sign of fever blister).   [name]. acyclovir ointment (ZOVIRAX) 5 % Apply 1 application topically daily as needed (first sign of fever blister).  [name]. doxycycline (VIBRA-TABS) 100 MG tablet Take 100 mg by mouth 2 (two) times daily as needed (rosacea).  . flecainide (TAMBOCOR) 100 MG tablet Take 1 tablet (100 mg total) by mouth 2 (two) times daily.  [name]. levothyroxine (SYNTHROID, LEVOTHROID) 88 MCG tablet Take 88 mcg by mouth daily before breakfast.   . losartan (COZAAR) 50 MG tablet Take 1 tablet  (50 mg total) by mouth daily.  . metoprolol succinate (TOPROL XL) 25 MG 24 hr tablet Take 0.5 tablets (12.5 mg total) by mouth daily.  . rosuvastatin (CRESTOR) 10 MG tablet Take 10 mg by mouth daily.   . Testosterone 20.25 MG/ACT (1.62%) GEL Apply 2 Pump topically daily.  [name]. XARELTO 20 MG TABS tablet TAKE 1 TABLET ONCE DAILY.     Allergies:   Gabapentin; Other; and Glucosamine forte [nutritional supplements]   Social History   Socioeconomic History  . Marital status: Single    Spouse name: None  . Number of children: None  . Years of education: None  . Highest education level: None  Social Needs  . Financial resource strain: None  . Food insecurity - worry: None  . Food insecurity - inability: None  . Transportation needs - medical: None  . Transportation needs - non-medical: None  Occupational History  . None  Tobacco Use  . Smoking status: Never Smoker  . Smokeless tobacco: Never Used  Substance and Sexual Activity  . Alcohol use: No  . Drug use: No  . Sexual activity: None  Other Topics Concern  . None  Social History Narrative  . None     Family History: The patient's family history includes Hypertension in  his mother; Stroke in his maternal grandmother.  ROS:   Please see the history of present illness.    ROS  All other systems reviewed and negative.   EKGs/Labs/Other Studies Reviewed:    The following studies were reviewed today: CPAP download  EKG:  EKG is not ordered today.   Recent Labs: 07/09/2016: Hemoglobin 13.7; Platelets 243 03/23/2017: BUN 21; Creatinine, Ser 0.96; Potassium 4.2; Sodium 142   Recent Lipid Panel No results found for: CHOL, TRIG, HDL, CHOLHDL, VLDL, LDLCALC, LDLDIRECT  Physical Exam:    VS:  BP 118/72   Pulse (!) 58   Ht 5\' 9"  (1.753 m)   Wt 174 lb 9.6 oz (79.2 kg)   SpO2 99%   BMI 25.78 kg/m     Wt Readings from Last 3 Encounters:  04/13/17 174 lb 9.6 oz (79.2 kg)  02/21/17 174 lb 9.6 oz (79.2 kg)  09/21/16 194 lb  (88 kg)     GEN:  Well nourished, well developed in no acute distress HEENT: Normal NECK: No JVD; No carotid bruits LYMPHATICS: No lymphadenopathy CARDIAC: RRR, no murmurs, rubs, gallops RESPIRATORY:  Clear to auscultation without rales, wheezing or rhonchi  ABDOMEN: Soft, non-tender, non-distended MUSCULOSKELETAL:  No edema; No deformity  SKIN: Warm and dry NEUROLOGIC:  Alert and oriented x 3 PSYCHIATRIC:  Normal affect   ASSESSMENT:    1. OSA (obstructive sleep apnea)   2. Essential hypertension    PLAN:    In order of problems listed above:  1.  OSA - the patient is tolerating PAP therapy well without any problems. The PAP download was reviewed today and showed an AHI of 3.7/hr on 9 cm H2O with 100% compliance in using more than 4 hours nightly.  The patient has been using and benefiting from CPAP use and will continue to benefit from therapy.   2.  HTN - Bp is well controlled on exam today.  He will continue on loartan 50mg  daily and Toprol XL 12.5mg  daily.     Medication Adjustments/Labs and Tests Ordered: Current medicines are reviewed at length with the patient today.  Concerns regarding medicines are outlined above.  No orders of the defined types were placed in this encounter.  No orders of the defined types were placed in this encounter.   Signed, Armanda Magicraci Turner, MD  04/13/2017 11:00 AM    [facility]

## 2017-04-14 ENCOUNTER — Telehealth: Payer: Self-pay | Admitting: Cardiology

## 2017-04-14 NOTE — Telephone Encounter (Signed)
New message ° ° ° °Patient returning call for lab results . Please call °

## 2017-04-14 NOTE — Telephone Encounter (Signed)
Informed pt of lab results. Pt verbalized understanding. 

## 2017-05-02 ENCOUNTER — Other Ambulatory Visit: Payer: Self-pay | Admitting: Interventional Cardiology

## 2017-05-18 ENCOUNTER — Ambulatory Visit: Payer: Medicare Other | Admitting: Interventional Cardiology

## 2017-05-18 ENCOUNTER — Encounter: Payer: Self-pay | Admitting: *Deleted

## 2017-05-18 ENCOUNTER — Encounter: Payer: Self-pay | Admitting: Interventional Cardiology

## 2017-05-18 VITALS — BP 134/86 | HR 78 | Ht 69.0 in | Wt 177.8 lb

## 2017-05-18 DIAGNOSIS — I10 Essential (primary) hypertension: Secondary | ICD-10-CM | POA: Diagnosis not present

## 2017-05-18 DIAGNOSIS — Z5181 Encounter for therapeutic drug level monitoring: Secondary | ICD-10-CM | POA: Diagnosis not present

## 2017-05-18 DIAGNOSIS — Z7901 Long term (current) use of anticoagulants: Secondary | ICD-10-CM | POA: Diagnosis not present

## 2017-05-18 DIAGNOSIS — I480 Paroxysmal atrial fibrillation: Secondary | ICD-10-CM

## 2017-05-18 DIAGNOSIS — G4733 Obstructive sleep apnea (adult) (pediatric): Secondary | ICD-10-CM | POA: Diagnosis not present

## 2017-05-18 DIAGNOSIS — Z79899 Other long term (current) drug therapy: Secondary | ICD-10-CM

## 2017-05-18 MED ORDER — METOPROLOL SUCCINATE ER 25 MG PO TB24: 25.0000 mg | ORAL_TABLET | Freq: Every day | ORAL | 3 refills | Status: DC

## 2017-05-18 NOTE — Progress Notes (Signed)
Cardiology Office Note    Date:  05/18/2017   ID:  Anthony Cole, DOB 1950-12-07, MRN [id number]  PCP:  Daisy Floro, MD  Cardiologist: Lesleigh Noe, MD   Chief Complaint  Patient presents with  . Atrial Fibrillation    History of Present Illness:  Anthony Cole is a 66 y.o. male who presents for obstructive sleep apnea, paroxysmal atrial fibrillation, hypertension, hyperlipidemia, and diabetes mellitus without complications  States he is in atrial fibrillation since Sunday 05/15/2017.  Occurred after shoveling snow.  He has had 4 prior cardioversions.  The most recent form by Dr. Delton See was March 2016.  When in atrial fibrillation he has exertional fatigue.  He denies chest pain.  Has not had syncope.   Past Medical History:  Diagnosis Date  . Atrial fibrillation (HCC)   . Diabetes mellitus without complication (HCC)   . Hyperlipidemia   . Hypertension   . Hypothyroidism   . Obesity (BMI 30-39.9) 01/25/2016  . OSA (obstructive sleep apnea) 07/16/2014   Moderate with AHI 19/hr on CPAP at 8cm H2O    Past Surgical History:  Procedure Laterality Date  . CARDIOVERSION  11/04/2011   Procedure: CARDIOVERSION;  Surgeon: Lesleigh Noe, MD;  Location: [facility] OR;  Service: Cardiovascular;  Laterality: N/A;  . CARDIOVERSION N/A 07/02/2013   Procedure: CARDIOVERSION;  Surgeon: Lesleigh Noe, MD;  Location: [facility] ENDOSCOPY;  Service: Cardiovascular;  Laterality: N/A;  10:07 elective cardioversion Lido 40mg , Propofol 60mg ,IV...200 joules, synched, pt presently in A flutter,...cardioverted to NSR...  . CARDIOVERSION N/A 09/19/2013   Procedure: CARDIOVERSION;  Surgeon: Lesleigh Noe, MD;  Location: [facility] ENDOSCOPY;  Service: Cardiovascular;  Laterality: N/A;  . CARDIOVERSION N/A 08/13/2014   Procedure: CARDIOVERSION;  Surgeon: Lars Masson, MD;  Location: [facility] ENDOSCOPY;  Service: Cardiovascular;  Laterality: N/A;  . FINGER SURGERY Left   . KNEE ARTHROSCOPY W/  DEBRIDEMENT Bilateral   . ROTATOR CUFF REPAIR Bilateral   . STAPEDES SURGERY Right   . TONSILLECTOMY      Current Medications: Outpatient Medications Prior to Visit  Medication Sig Dispense Refill  . acyclovir (ZOVIRAX) 200 MG capsule Take 200 mg by mouth once as needed (first sign of fever blister).     [name] acyclovir ointment (ZOVIRAX) 5 % Apply 1 application topically daily as needed (first sign of fever blister).    [name] doxycycline (VIBRA-TABS) 100 MG tablet Take 100 mg by mouth 2 (two) times daily as needed (rosacea).    . flecainide (TAMBOCOR) 100 MG tablet Take 1 tablet (100 mg total) by mouth every 12 (twelve) hours. 180 tablet 2  . levothyroxine (SYNTHROID, LEVOTHROID) 88 MCG tablet Take 88 mcg by mouth daily before breakfast.     . losartan (COZAAR) 50 MG tablet Take 1 tablet (50 mg total) by mouth daily. 30 tablet 11  . rosuvastatin (CRESTOR) 10 MG tablet Take 10 mg by mouth daily.     . Testosterone 20.25 MG/ACT (1.62%) GEL Apply 2 Pump topically daily.    [name] 20 MG TABS tablet TAKE 1 TABLET ONCE DAILY. 90 tablet 2  . metoprolol succinate (TOPROL XL) 25 MG 24 hr tablet Take 0.5 tablets (12.5 mg total) by mouth daily. 45 tablet 3  . flecainide (TAMBOCOR) 100 MG tablet Take 1 tablet (100 mg total) by mouth 2 (two) times daily. (Patient not taking: Reported on 05/18/2017) 180 tablet 3   No facility-administered medications prior to visit.  Allergies:   Gabapentin; Other; and Glucosamine forte [nutritional supplements]   Social History   Socioeconomic History  . Marital status: Single    Spouse name: None  . Number of children: None  . Years of education: None  . Highest education level: None  Social Needs  . Financial resource strain: None  . Food insecurity - worry: None  . Food insecurity - inability: None  . Transportation needs - medical: None  . Transportation needs - non-medical: None  Occupational History  . None  Tobacco Use  . Smoking status: Never  Smoker  . Smokeless tobacco: Never Used  Substance and Sexual Activity  . Alcohol use: No  . Drug use: No  . Sexual activity: None  Other Topics Concern  . None  Social History Narrative  . None     Family History:  The patient's family history includes Hypertension in his mother; Stroke in his maternal grandmother.   ROS:   Please see the history of present illness.    Otherwise feeling well.  Has not missed any doses of Xarelto.  Has had some nosebleeds. All other systems reviewed and are negative.   PHYSICAL EXAM:   VS:  BP 134/86   Pulse 78   Ht 5\' 9"  (1.753 m)   Wt 177 lb 12.8 oz (80.6 kg)   BMI 26.26 kg/m     GEN: Well nourished, well developed, in no acute distress  HEENT: normal  Neck: no JVD, carotid bruits, or masses Cardiac: IIRR; no murmurs, rubs, or gallops,no edema  Respiratory:  clear to auscultation bilaterally, normal work of breathing GI: soft, nontender, nondistended, + BS MS: no deformity or atrophy  Skin: warm and dry, no rash Neuro:  Alert and Oriented x 3, Strength and sensation are intact Psych: euthymic mood, full affect  Wt Readings from Last 3 Encounters:  05/18/17 177 lb 12.8 oz (80.6 kg)  04/13/17 174 lb 9.6 oz (79.2 kg)  02/21/17 174 lb 9.6 oz (79.2 kg)      Studies/Labs Reviewed:   EKG:  EKG atrial fibrillation with ventricular rate 78 bpm, QRS duration 94 ms, with nonspecific ST-T wave change.  QTc is 430.  Recent Labs: 07/09/2016: Hemoglobin 13.7; Platelets 243 04/13/2017: BUN 17; Creatinine, Ser 0.88; Potassium 4.5; Sodium 142   Lipid Panel No results found for: CHOL, TRIG, HDL, CHOLHDL, VLDL, LDLCALC, LDLDIRECT  Additional studies/ records that were reviewed today include:  No new data    ASSESSMENT:    1. Paroxysmal atrial fibrillation (HCC)   2. Essential hypertension   3. OSA (obstructive sleep apnea)   4. Long term current use of anticoagulant therapy   5. Encounter for monitoring flecainide therapy      PLAN:    In order of problems listed above:  1. Recurrent atrial fibrillation with controlled ventricular response.  Beta-blocker dose has been decreased to 12.5 mg daily because of resting bradycardia in the mid 50s.  Today he is documented to be in atrial fib, probably for 72 hours.  Increase flecainide to 150 mg twice daily.  Increase metoprolol succinate to 25 mg/day.  Elective electrical cardioversion will be performed on 05/23/2017.  If he feels better prior to that time he will notify us and we will counseled the procedure if we get a confirmatory EKG demonstrating sinus rhythm.  After cardioversion he should go back to the medication regimen prior to today's visit which is metoprolol succinate 12.5 mg daily flecainide 100 mg twice daily. 2. Adequate  blood pressure control 3. Not addressed 4. No bleeding complications on Xarelto and no missed doses. 5. Transient increase in flecainide dose to 150 mg twice daily.  Discussed with Dr. Ladona Ridgelaylor.  Will take dose back down to 100 mg twice daily after cardioversion.  Clinical follow-up in 1 month.  Call prior to then if any concerns or complaints.  Prolonged office visit with greater than 50% of the time spent in counseling and coordination of care.  Medication Adjustments/Labs and Tests Ordered: Current medicines are reviewed at length with the patient today.  Concerns regarding medicines are outlined above.  Medication changes, Labs and Tests ordered today are listed in the Patient Instructions below. Patient Instructions  Medication Instructions:  1) INCREASE Flecainide to 150mg  (1.5 tablets) twice daily until your Cardioversion.  Please contact the office if you feel like you have gone back into rhythm prior to your Cardioversion. 2) INCREASE Metoprolol Succinate to 25mg  once daily.  Labwork: BMET, INR and CBC today  Testing/Procedures: Your physician has recommended that you have a Cardioversion (DCCV). Electrical Cardioversion uses a jolt of  electricity to your heart either through paddles or wired patches attached to your chest. This is a controlled, usually prescheduled, procedure. Defibrillation is done under light anesthesia in the hospital, and you usually go home the day of the procedure. This is done to get your heart back into a normal rhythm. You are not awake for the procedure. Please see the instruction sheet given to you today.   Follow-Up: Your physician recommends that you schedule a follow-up appointment in: 1 month with an EKG.   Any Other Special Instructions Will Be Listed Below (If Applicable).     If you need a refill on your cardiac medications before your next appointment, please call your pharmacy.      Signed, Lesleigh NoeHenry W Smith III, MD  05/18/2017 2:38 PM    [facility] [address], [place], [place]  [address] Phone: [phone]; Fax: [phone]

## 2017-05-18 NOTE — Patient Instructions (Signed)
Medication Instructions:  1) INCREASE Flecainide to 150mg  (1.5 tablets) twice daily until your Cardioversion.  Please contact the office if you feel like you have gone back into rhythm prior to your Cardioversion. 2) INCREASE Metoprolol Succinate to 25mg  once daily.  Labwork: BMET, INR and CBC today  Testing/Procedures: Your physician has recommended that you have a Cardioversion (DCCV). Electrical Cardioversion uses a jolt of electricity to your heart either through paddles or wired patches attached to your chest. This is a controlled, usually prescheduled, procedure. Defibrillation is done under light anesthesia in the hospital, and you usually go home the day of the procedure. This is done to get your heart back into a normal rhythm. You are not awake for the procedure. Please see the instruction sheet given to you today.   Follow-Up: Your physician recommends that you schedule a follow-up appointment in: 1 month with an EKG.   Any Other Special Instructions Will Be Listed Below (If Applicable).     If you need a refill on your cardiac medications before your next appointment, please call your pharmacy.

## 2017-05-18 NOTE — H&P (View-Only) (Signed)
Cardiology Office Note    Date:  05/18/2017   ID:  Anthony Cole, DOB 1950-12-07, MRN [id number]  PCP:  Daisy Floro, MD  Cardiologist: Lesleigh Noe, MD   Chief Complaint  Patient presents with  . Atrial Fibrillation    History of Present Illness:  Anthony Cole is a 66 y.o. male who presents for obstructive sleep apnea, paroxysmal atrial fibrillation, hypertension, hyperlipidemia, and diabetes mellitus without complications  States he is in atrial fibrillation since Sunday 05/15/2017.  Occurred after shoveling snow.  He has had 4 prior cardioversions.  The most recent form by Dr. Delton See was March 2016.  When in atrial fibrillation he has exertional fatigue.  He denies chest pain.  Has not had syncope.   Past Medical History:  Diagnosis Date  . Atrial fibrillation (HCC)   . Diabetes mellitus without complication (HCC)   . Hyperlipidemia   . Hypertension   . Hypothyroidism   . Obesity (BMI 30-39.9) 01/25/2016  . OSA (obstructive sleep apnea) 07/16/2014   Moderate with AHI 19/hr on CPAP at 8cm H2O    Past Surgical History:  Procedure Laterality Date  . CARDIOVERSION  11/04/2011   Procedure: CARDIOVERSION;  Surgeon: Lesleigh Noe, MD;  Location: [facility] OR;  Service: Cardiovascular;  Laterality: N/A;  . CARDIOVERSION N/A 07/02/2013   Procedure: CARDIOVERSION;  Surgeon: Lesleigh Noe, MD;  Location: [facility] ENDOSCOPY;  Service: Cardiovascular;  Laterality: N/A;  10:07 elective cardioversion Lido 40mg , Propofol 60mg ,IV...200 joules, synched, pt presently in A flutter,...cardioverted to NSR...  . CARDIOVERSION N/A 09/19/2013   Procedure: CARDIOVERSION;  Surgeon: Lesleigh Noe, MD;  Location: [facility] ENDOSCOPY;  Service: Cardiovascular;  Laterality: N/A;  . CARDIOVERSION N/A 08/13/2014   Procedure: CARDIOVERSION;  Surgeon: Lars Masson, MD;  Location: [facility] ENDOSCOPY;  Service: Cardiovascular;  Laterality: N/A;  . FINGER SURGERY Left   . KNEE ARTHROSCOPY W/  DEBRIDEMENT Bilateral   . ROTATOR CUFF REPAIR Bilateral   . STAPEDES SURGERY Right   . TONSILLECTOMY      Current Medications: Outpatient Medications Prior to Visit  Medication Sig Dispense Refill  . acyclovir (ZOVIRAX) 200 MG capsule Take 200 mg by mouth once as needed (first sign of fever blister).     [name] acyclovir ointment (ZOVIRAX) 5 % Apply 1 application topically daily as needed (first sign of fever blister).    [name] doxycycline (VIBRA-TABS) 100 MG tablet Take 100 mg by mouth 2 (two) times daily as needed (rosacea).    . flecainide (TAMBOCOR) 100 MG tablet Take 1 tablet (100 mg total) by mouth every 12 (twelve) hours. 180 tablet 2  . levothyroxine (SYNTHROID, LEVOTHROID) 88 MCG tablet Take 88 mcg by mouth daily before breakfast.     . losartan (COZAAR) 50 MG tablet Take 1 tablet (50 mg total) by mouth daily. 30 tablet 11  . rosuvastatin (CRESTOR) 10 MG tablet Take 10 mg by mouth daily.     . Testosterone 20.25 MG/ACT (1.62%) GEL Apply 2 Pump topically daily.    [name] 20 MG TABS tablet TAKE 1 TABLET ONCE DAILY. 90 tablet 2  . metoprolol succinate (TOPROL XL) 25 MG 24 hr tablet Take 0.5 tablets (12.5 mg total) by mouth daily. 45 tablet 3  . flecainide (TAMBOCOR) 100 MG tablet Take 1 tablet (100 mg total) by mouth 2 (two) times daily. (Patient not taking: Reported on 05/18/2017) 180 tablet 3   No facility-administered medications prior to visit.  Allergies:   Gabapentin; Other; and Glucosamine forte [nutritional supplements]   Social History   Socioeconomic History  . Marital status: Single    Spouse name: None  . Number of children: None  . Years of education: None  . Highest education level: None  Social Needs  . Financial resource strain: None  . Food insecurity - worry: None  . Food insecurity - inability: None  . Transportation needs - medical: None  . Transportation needs - non-medical: None  Occupational History  . None  Tobacco Use  . Smoking status: Never  Smoker  . Smokeless tobacco: Never Used  Substance and Sexual Activity  . Alcohol use: No  . Drug use: No  . Sexual activity: None  Other Topics Concern  . None  Social History Narrative  . None     Family History:  The patient's family history includes Hypertension in his mother; Stroke in his maternal grandmother.   ROS:   Please see the history of present illness.    Otherwise feeling well.  Has not missed any doses of Xarelto.  Has had some nosebleeds. All other systems reviewed and are negative.   PHYSICAL EXAM:   VS:  BP 134/86   Pulse 78   Ht 5\' 9"  (1.753 m)   Wt 177 lb 12.8 oz (80.6 kg)   BMI 26.26 kg/m     GEN: Well nourished, well developed, in no acute distress  HEENT: normal  Neck: no JVD, carotid bruits, or masses Cardiac: IIRR; no murmurs, rubs, or gallops,no edema  Respiratory:  clear to auscultation bilaterally, normal work of breathing GI: soft, nontender, nondistended, + BS MS: no deformity or atrophy  Skin: warm and dry, no rash Neuro:  Alert and Oriented x 3, Strength and sensation are intact Psych: euthymic mood, full affect  Wt Readings from Last 3 Encounters:  05/18/17 177 lb 12.8 oz (80.6 kg)  04/13/17 174 lb 9.6 oz (79.2 kg)  02/21/17 174 lb 9.6 oz (79.2 kg)      Studies/Labs Reviewed:   EKG:  EKG atrial fibrillation with ventricular rate 78 bpm, QRS duration 94 ms, with nonspecific ST-T wave change.  QTc is 430.  Recent Labs: 07/09/2016: Hemoglobin 13.7; Platelets 243 04/13/2017: BUN 17; Creatinine, Ser 0.88; Potassium 4.5; Sodium 142   Lipid Panel No results found for: CHOL, TRIG, HDL, CHOLHDL, VLDL, LDLCALC, LDLDIRECT  Additional studies/ records that were reviewed today include:  No new data    ASSESSMENT:    1. Paroxysmal atrial fibrillation (HCC)   2. Essential hypertension   3. OSA (obstructive sleep apnea)   4. Long term current use of anticoagulant therapy   5. Encounter for monitoring flecainide therapy      PLAN:    In order of problems listed above:  1. Recurrent atrial fibrillation with controlled ventricular response.  Beta-blocker dose has been decreased to 12.5 mg daily because of resting bradycardia in the mid 50s.  Today he is documented to be in atrial fib, probably for 72 hours.  Increase flecainide to 150 mg twice daily.  Increase metoprolol succinate to 25 mg/day.  Elective electrical cardioversion will be performed on 05/23/2017.  If he feels better prior to that time he will notify us and we will counseled the procedure if we get a confirmatory EKG demonstrating sinus rhythm.  After cardioversion he should go back to the medication regimen prior to today's visit which is metoprolol succinate 12.5 mg daily flecainide 100 mg twice daily. 2. Adequate  blood pressure control 3. Not addressed 4. No bleeding complications on Xarelto and no missed doses. 5. Transient increase in flecainide dose to 150 mg twice daily.  Discussed with Dr. Ladona Ridgelaylor.  Will take dose back down to 100 mg twice daily after cardioversion.  Clinical follow-up in 1 month.  Call prior to then if any concerns or complaints.  Prolonged office visit with greater than 50% of the time spent in counseling and coordination of care.  Medication Adjustments/Labs and Tests Ordered: Current medicines are reviewed at length with the patient today.  Concerns regarding medicines are outlined above.  Medication changes, Labs and Tests ordered today are listed in the Patient Instructions below. Patient Instructions  Medication Instructions:  1) INCREASE Flecainide to 150mg  (1.5 tablets) twice daily until your Cardioversion.  Please contact the office if you feel like you have gone back into rhythm prior to your Cardioversion. 2) INCREASE Metoprolol Succinate to 25mg  once daily.  Labwork: BMET, INR and CBC today  Testing/Procedures: Your physician has recommended that you have a Cardioversion (DCCV). Electrical Cardioversion uses a jolt of  electricity to your heart either through paddles or wired patches attached to your chest. This is a controlled, usually prescheduled, procedure. Defibrillation is done under light anesthesia in the hospital, and you usually go home the day of the procedure. This is done to get your heart back into a normal rhythm. You are not awake for the procedure. Please see the instruction sheet given to you today.   Follow-Up: Your physician recommends that you schedule a follow-up appointment in: 1 month with an EKG.   Any Other Special Instructions Will Be Listed Below (If Applicable).     If you need a refill on your cardiac medications before your next appointment, please call your pharmacy.      Signed, Lesleigh NoeHenry W Bruce Mayers III, MD  05/18/2017 2:38 PM    [facility] [address], [place], [place]  [address] Phone: [phone]; Fax: [phone]

## 2017-05-19 LAB — CBC
Hematocrit: 40.5 % (ref 37.5–51.0)
Hemoglobin: 14.1 g/dL (ref 13.0–17.7)
MCH: 31 pg (ref 26.6–33.0)
MCHC: 34.8 g/dL (ref 31.5–35.7)
MCV: 89 fL (ref 79–97)
Platelets: 286 10*3/uL (ref 150–379)
RBC: 4.55 x10E6/uL (ref 4.14–5.80)
RDW: 13.9 % (ref 12.3–15.4)
WBC: 8.6 10*3/uL (ref 3.4–10.8)

## 2017-05-19 LAB — BASIC METABOLIC PANEL
BUN / CREAT RATIO: 19 (ref 10–24)
BUN: 17 mg/dL (ref 8–27)
CHLORIDE: 100 mmol/L (ref 96–106)
CO2: 26 mmol/L (ref 20–29)
Calcium: 9.4 mg/dL (ref 8.6–10.2)
Creatinine, Ser: 0.89 mg/dL (ref 0.76–1.27)
GFR calc non Af Amer: 90 mL/min/{1.73_m2} (ref >59)
GFR, EST AFRICAN AMERICAN: 104 mL/min/{1.73_m2} (ref >59)
Glucose: 100 mg/dL — ABNORMAL HIGH (ref 65–99)
POTASSIUM: 4.2 mmol/L (ref 3.5–5.2)
Sodium: 143 mmol/L (ref 134–144)

## 2017-05-19 LAB — PROTIME-INR
INR: 1.4 — AB (ref 0.8–1.2)
PROTHROMBIN TIME: 14.6 s — AB (ref 9.1–12.0)

## 2017-05-21 ENCOUNTER — Other Ambulatory Visit: Payer: Self-pay | Admitting: Interventional Cardiology

## 2017-05-21 DIAGNOSIS — I480 Paroxysmal atrial fibrillation: Secondary | ICD-10-CM

## 2017-05-21 MED ORDER — SODIUM CHLORIDE 0.9% FLUSH: 3.0000 mL | INTRAVENOUS | Status: AC | PRN

## 2017-05-21 MED ORDER — SODIUM CHLORIDE 0.9% FLUSH: 3.0000 mL | Freq: Two times a day (BID) | INTRAVENOUS | Status: AC

## 2017-05-21 MED ORDER — SODIUM CHLORIDE 0.9 % IV SOLN: 250.0000 mL | INTRAVENOUS | Status: AC

## 2017-05-23 ENCOUNTER — Ambulatory Visit (HOSPITAL_COMMUNITY): Payer: Medicare Other | Admitting: Anesthesiology

## 2017-05-23 ENCOUNTER — Telehealth: Payer: Self-pay | Admitting: Interventional Cardiology

## 2017-05-23 ENCOUNTER — Encounter (HOSPITAL_COMMUNITY): Payer: Self-pay | Admitting: *Deleted

## 2017-05-23 ENCOUNTER — Other Ambulatory Visit: Payer: Self-pay

## 2017-05-23 ENCOUNTER — Other Ambulatory Visit: Payer: Self-pay | Admitting: *Deleted

## 2017-05-23 ENCOUNTER — Ambulatory Visit (HOSPITAL_COMMUNITY)
Admission: RE | Admit: 2017-05-23 | Discharge: 2017-05-23 | Disposition: A | Discharge status: Home / Self Care | Payer: Medicare Other | Source: Ambulatory Visit | Attending: Cardiovascular Disease | Admitting: Cardiovascular Disease

## 2017-05-23 ENCOUNTER — Encounter (HOSPITAL_COMMUNITY)
Admission: RE | Disposition: A | Discharge status: Home / Self Care | Payer: Self-pay | Source: Ambulatory Visit | Attending: Cardiovascular Disease

## 2017-05-23 DIAGNOSIS — G4733 Obstructive sleep apnea (adult) (pediatric): Secondary | ICD-10-CM | POA: Diagnosis not present

## 2017-05-23 DIAGNOSIS — Z7901 Long term (current) use of anticoagulants: Secondary | ICD-10-CM | POA: Insufficient documentation

## 2017-05-23 DIAGNOSIS — E039 Hypothyroidism, unspecified: Secondary | ICD-10-CM | POA: Diagnosis not present

## 2017-05-23 DIAGNOSIS — I4819 Other persistent atrial fibrillation: Secondary | ICD-10-CM

## 2017-05-23 DIAGNOSIS — I10 Essential (primary) hypertension: Secondary | ICD-10-CM | POA: Insufficient documentation

## 2017-05-23 DIAGNOSIS — E119 Type 2 diabetes mellitus without complications: Secondary | ICD-10-CM | POA: Diagnosis not present

## 2017-05-23 DIAGNOSIS — Z79899 Other long term (current) drug therapy: Secondary | ICD-10-CM | POA: Insufficient documentation

## 2017-05-23 DIAGNOSIS — E785 Hyperlipidemia, unspecified: Secondary | ICD-10-CM | POA: Diagnosis not present

## 2017-05-23 DIAGNOSIS — Z79890 Hormone replacement therapy (postmenopausal): Secondary | ICD-10-CM | POA: Insufficient documentation

## 2017-05-23 DIAGNOSIS — I480 Paroxysmal atrial fibrillation: Secondary | ICD-10-CM | POA: Insufficient documentation

## 2017-05-23 DIAGNOSIS — I481 Persistent atrial fibrillation: Secondary | ICD-10-CM

## 2017-05-23 HISTORY — PX: CARDIOVERSION: SHX1299

## 2017-05-23 SURGERY — CARDIOVERSION
Anesthesia: General

## 2017-05-23 MED ORDER — LIDOCAINE HCL (CARDIAC) 20 MG/ML IV SOLN
INTRAVENOUS | Status: DC | PRN
  Administered 2017-05-23: 40 mg via INTRAVENOUS

## 2017-05-23 MED ORDER — METOPROLOL SUCCINATE ER 25 MG PO TB24: 12.5000 mg | ORAL_TABLET | Freq: Every day | ORAL | 3 refills | Status: DC

## 2017-05-23 MED ORDER — PROPOFOL 10 MG/ML IV BOLUS
INTRAVENOUS | Status: DC | PRN
  Administered 2017-05-23: 80 mg via INTRAVENOUS

## 2017-05-23 MED ORDER — SODIUM CHLORIDE 0.9 % IV SOLN
INTRAVENOUS | Status: DC | PRN
  Administered 2017-05-23: 07:00:00 via INTRAVENOUS

## 2017-05-23 NOTE — Telephone Encounter (Signed)
New message    [facility]. Please call

## 2017-05-23 NOTE — Progress Notes (Signed)
DR. Elease HashimotoNahser made aware of 164/104 for blood pressure. Pt in general very high strung and seems nervous. Dr. Elease HashimotoNahser is fine with bp for pre cardioversion.

## 2017-05-23 NOTE — CV Procedure (Signed)
    Cardioversion Note  Floy SabinaRoderick B Solomon [id number] [birth year]  Procedure: DC Cardioversion Indications: atrial fib   Procedure Details Consent: Obtained Time Out: Verified patient identification, verified procedure, site/side was marked, verified correct patient position, special equipment/implants available, Radiology Safety Procedures followed,  medications/allergies/relevent history reviewed, required imaging and test results available.  Performed  The patient has been on adequate anticoagulation.  The patient received Lidocaine 40 mg IV followed by Propofol 80 mg IV  for sedation.  Synchronous cardioversion was performed at 120  joules.  The cardioversion was successful     Complications: No apparent complications Patient did tolerate procedure well.   Vesta MixerPhilip J. Nahser, Montez HagemanJr., MD, [facility] 05/23/2017, 8:19 AM

## 2017-05-23 NOTE — Transfer of Care (Signed)
Immediate Anesthesia Transfer of Care Note  Patient: Anthony Cole  Procedure(s) Performed: CARDIOVERSION (N/A )  Patient Location: PACU and Endoscopy Unit  Anesthesia Type:MAC  Level of Consciousness: awake and alert   Airway & Oxygen Therapy: Patient Spontanous Breathing and Patient connected to nasal cannula oxygen  Post-op Assessment: Report given to RN and Post -op Vital signs reviewed and stable  Post vital signs: Reviewed and stable  Last Vitals:  Vitals:   05/23/17 0739 05/23/17 0820  BP: (!) 164/104 (!) 157/77  Pulse:  (!) 53  Resp:  14  Temp:  36.7 C  SpO2:  100%    Last Pain:  Vitals:   05/23/17 0820  TempSrc: Oral         Complications: No apparent anesthesia complications

## 2017-05-23 NOTE — Anesthesia Preprocedure Evaluation (Signed)
Anesthesia Evaluation  Patient identified by MRN, date of birth, ID band Patient awake    Reviewed: Allergy & Precautions, NPO status , Patient's Chart, lab work & pertinent test results  Airway Mallampati: II   Neck ROM: Full    Dental no notable dental hx.    Pulmonary sleep apnea ,    breath sounds clear to auscultation       Cardiovascular hypertension, + dysrhythmias  Rhythm:Irregular Rate:Normal     Neuro/Psych    GI/Hepatic negative GI ROS, Neg liver ROS,   Endo/Other  diabetesHypothyroidism   Renal/GU      Musculoskeletal   Abdominal   Peds  Hematology   Anesthesia Other Findings   Reproductive/Obstetrics                             Anesthesia Physical Anesthesia Plan  ASA: III  Anesthesia Plan: General   Post-op Pain Management:    Induction: Intravenous  PONV Risk Score and Plan: 2 and Treatment may vary due to age or medical condition  Airway Management Planned: Mask  Additional Equipment:   Intra-op Plan:   Post-operative Plan:   Informed Consent: I have reviewed the patients History and Physical, chart, labs and discussed the procedure including the risks, benefits and alternatives for the proposed anesthesia with the patient or authorized representative who has indicated his/her understanding and acceptance.     Plan Discussed with: CRNA  Anesthesia Plan Comments:         Anesthesia Quick Evaluation

## 2017-05-23 NOTE — Anesthesia Postprocedure Evaluation (Signed)
Anesthesia Post Note  Patient: Anthony Cole  Procedure(s) Performed: CARDIOVERSION (N/A )     Patient location during evaluation: PACU Anesthesia Type: General Level of consciousness: awake and alert Pain management: pain level controlled Vital Signs Assessment: post-procedure vital signs reviewed and stable Respiratory status: spontaneous breathing, nonlabored ventilation, respiratory function stable and patient connected to nasal cannula oxygen Cardiovascular status: blood pressure returned to baseline and stable Postop Assessment: no apparent nausea or vomiting Anesthetic complications: no    Last Vitals:  Vitals:   05/23/17 0830 05/23/17 0840  BP: (!) 142/92 (!) 147/81  Pulse: (!) 54 (!) 51  Resp: 15 16  Temp:    SpO2: 99% 100%    Last Pain:  Vitals:   05/23/17 0820  TempSrc: Oral                 Avamae Dehaan,JAMES TERRILL

## 2017-05-23 NOTE — Discharge Instructions (Signed)
Electrical Cardioversion, Care After °This sheet gives you information about how to care for yourself after your procedure. Your health care provider may also give you more specific instructions. If you have problems or questions, contact your health care provider. °What can I expect after the procedure? °After the procedure, it is common to have: °· Some redness on the skin where the shocks were given. ° °Follow these instructions at home: °· Do not drive for 24 hours if you were given a medicine to help you relax (sedative). °· Take over-the-counter and prescription medicines only as told by your health care provider. °· Ask your health care provider how to check your pulse. Check it often. °· Rest for 48 hours after the procedure or as told by your health care provider. °· Avoid or limit your caffeine use as told by your health care provider. °Contact a health care provider if: °· You feel like your heart is beating too quickly or your pulse is not regular. °· You have a serious muscle cramp that does not go away. °Get help right away if: °· You have discomfort in your chest. °· You are dizzy or you feel faint. °· You have trouble breathing or you are short of breath. °· Your speech is slurred. °· You have trouble moving an arm or leg on one side of your body. °· Your fingers or toes turn cold or blue. °This information is not intended to replace advice given to you by your health care provider. Make sure you discuss any questions you have with your health care provider. °Document Released: 03/14/2013 Document Revised: 12/26/2015 Document Reviewed: 11/28/2015 °Elsevier Interactive Patient Education © 2018 [employer] ° °

## 2017-05-23 NOTE — Interval H&P Note (Signed)
History and Physical Interval Note:  05/23/2017 8:05 AM  Anthony Cole  has presented today for surgery, with the diagnosis of AFIB  The various methods of treatment have been discussed with the patient and family. After consideration of risks, benefits and other options for treatment, the patient has consented to  Procedure(s): CARDIOVERSION (N/A) as a surgical intervention .  The patient's history has been reviewed, patient examined, no change in status, stable for surgery.  I have reviewed the patient's chart and labs.  Questions were answered to the patient's satisfaction.     Kristeen MissPhilip Jaelle Campanile

## 2017-05-30 ENCOUNTER — Telehealth: Payer: Self-pay | Admitting: Interventional Cardiology

## 2017-05-30 NOTE — Telephone Encounter (Signed)
He will need to be seen within the next 7 days in Afib clinic or by other provider because he has a tendency toward bradycardia.

## 2017-05-30 NOTE — Telephone Encounter (Signed)
Spoke with Anthony Cole, he went back into atrial fib on Sunday. He feels his pulse and it is irregular and he feels a flutter in his chest. He feels the rate is fast. Advised patient to take 150 mg of flecainide twice daily and metoprolol 25 mg once daily until his follow up appointment with lori gerhardt 06-28-17. He will call back if something changes. Will forward this information to dr Katrinka Blazingsmith to review and advise. Anthony Cole agreed with this plan.

## 2017-05-30 NOTE — Telephone Encounter (Signed)
Anthony Cole is calling because he had a cardioversion on Monday , and he is back into AFIB which started on Sunday . Please call

## 2017-06-01 NOTE — Telephone Encounter (Signed)
Spoke with pt, Follow up scheduled in the atrial fib clinic. The patient believes he is still out of rhythm. Directions to office and parking discussed.

## 2017-06-08 ENCOUNTER — Ambulatory Visit (HOSPITAL_COMMUNITY)
Admission: RE | Admit: 2017-06-08 | Discharge: 2017-06-08 | Disposition: A | Discharge status: Home / Self Care | Payer: Medicare Other | Source: Ambulatory Visit | Attending: Nurse Practitioner | Admitting: Nurse Practitioner

## 2017-06-08 ENCOUNTER — Encounter (HOSPITAL_COMMUNITY): Payer: Self-pay | Admitting: Nurse Practitioner

## 2017-06-08 VITALS — BP 142/84 | HR 65 | Ht 69.0 in | Wt 175.0 lb

## 2017-06-08 DIAGNOSIS — E039 Hypothyroidism, unspecified: Secondary | ICD-10-CM | POA: Diagnosis not present

## 2017-06-08 DIAGNOSIS — I4819 Other persistent atrial fibrillation: Secondary | ICD-10-CM

## 2017-06-08 DIAGNOSIS — Z6830 Body mass index (BMI) 30.0-30.9, adult: Secondary | ICD-10-CM | POA: Diagnosis not present

## 2017-06-08 DIAGNOSIS — Z7901 Long term (current) use of anticoagulants: Secondary | ICD-10-CM | POA: Diagnosis not present

## 2017-06-08 DIAGNOSIS — G4733 Obstructive sleep apnea (adult) (pediatric): Secondary | ICD-10-CM | POA: Insufficient documentation

## 2017-06-08 DIAGNOSIS — E119 Type 2 diabetes mellitus without complications: Secondary | ICD-10-CM | POA: Insufficient documentation

## 2017-06-08 DIAGNOSIS — E669 Obesity, unspecified: Secondary | ICD-10-CM | POA: Diagnosis not present

## 2017-06-08 DIAGNOSIS — E785 Hyperlipidemia, unspecified: Secondary | ICD-10-CM | POA: Insufficient documentation

## 2017-06-08 DIAGNOSIS — I480 Paroxysmal atrial fibrillation: Secondary | ICD-10-CM | POA: Diagnosis not present

## 2017-06-08 DIAGNOSIS — I10 Essential (primary) hypertension: Secondary | ICD-10-CM | POA: Diagnosis not present

## 2017-06-08 DIAGNOSIS — I481 Persistent atrial fibrillation: Secondary | ICD-10-CM

## 2017-06-08 DIAGNOSIS — I4891 Unspecified atrial fibrillation: Secondary | ICD-10-CM | POA: Diagnosis present

## 2017-06-08 DIAGNOSIS — Z79899 Other long term (current) drug therapy: Secondary | ICD-10-CM | POA: Diagnosis not present

## 2017-06-08 MED ORDER — FLECAINIDE ACETATE 100 MG PO TABS: 100.0000 mg | ORAL_TABLET | Freq: Two times a day (BID) | ORAL | 2 refills | Status: DC

## 2017-06-08 NOTE — Patient Instructions (Signed)
Your physician has recommended you make the following change in your medication:  1)Decrease flecainide to 100mg  twice a day  Scheduler will be in touch for appt with Dr. Johney FrameAllred.

## 2017-06-08 NOTE — Progress Notes (Signed)
Primary Care Physician: Daisy Florooss, Charles Alan, MD Referring Physician: Dr. Corinna CapraSmith   Anthony Cole is a 67 y.o. male with a h/o atrial fibrillation with 2013 and multiple cardioversion, previously using amiodarone x 2 years and then started on flecainide. Flecainide has worked well for him for 2 years but recently had breakthrough afib . Flecainide was increased to 150 mg bid  and he had successful cardioversion but with ERAF.    He is in afib clinic to discuss options. He continues in afib despite increase of flecainide to 150 mg bid again and increase in metoprolol to 25 mg daily. He has tendency to run slow in SR. He saw Dr. Macon LargeBahnson a few years ago to discuss ablation but was fearful of the procedure and did not want to proceed. He does not drink alcohol, smoke, he is of normal weight. He does wear cpap.  Today, he denies symptoms of palpitations, chest pain, shortness of breath, orthopnea, PND, lower extremity edema, dizziness, presyncope, syncope, or neurologic sequela. The patient is tolerating medications without difficulties and is otherwise without complaint today.   Past Medical History:  Diagnosis Date  . Atrial fibrillation (HCC)   . Diabetes mellitus without complication (HCC)   . Hyperlipidemia   . Hypertension   . Hypothyroidism   . Obesity (BMI 30-39.9) 01/25/2016  . OSA (obstructive sleep apnea) 07/16/2014   Moderate with AHI 19/hr on CPAP at 8cm H2O   Past Surgical History:  Procedure Laterality Date  . CARDIOVERSION  11/04/2011   Procedure: CARDIOVERSION;  Surgeon: Lesleigh NoeHenry W Smith III, MD;  [facility];  Service: Cardiovascular;  Laterality: N/A;  . CARDIOVERSION N/A 07/02/2013   Procedure: CARDIOVERSION;  Surgeon: Lesleigh NoeHenry W Smith III, MD;  [facility];  Service: Cardiovascular;  Laterality: N/A;  10:07 elective cardioversion Lido 40mg , Propofol 60mg ,IV...200 joules, synched, pt presently in A flutter,...cardioverted to NSR...  . CARDIOVERSION N/A 09/19/2013   Procedure: CARDIOVERSION;  Surgeon: Lesleigh NoeHenry W Smith III, MD;  [facility];  Service: Cardiovascular;  Laterality: N/A;  . CARDIOVERSION N/A 08/13/2014   Procedure: CARDIOVERSION;  Surgeon: Lars MassonKatarina H Nelson, MD;  [facility];  Service: Cardiovascular;  Laterality: N/A;  . CARDIOVERSION N/A 05/23/2017   Procedure: CARDIOVERSION;  Surgeon: Vesta MixerNahser, Philip J, MD;  [facility];  Service: Cardiovascular;  Laterality: N/A;  . FINGER SURGERY Left   . KNEE ARTHROSCOPY W/ DEBRIDEMENT Bilateral   . ROTATOR CUFF REPAIR Bilateral   . STAPEDES SURGERY Right   . TONSILLECTOMY      Current Outpatient Medications  Medication Sig Dispense Refill  . acyclovir (ZOVIRAX) 200 MG capsule Take 200 mg by mouth once as needed (first sign of fever blister).     [name]. acyclovir ointment (ZOVIRAX) 5 % Apply 1 application topically daily as needed (first sign of fever blister).    [name]. doxycycline (VIBRA-TABS) 100 MG tablet Take 100 mg by mouth 2 (two) times daily as needed (rosacea).    . flecainide (TAMBOCOR) 100 MG tablet Take 1 tablet (100 mg total) by mouth every 12 (twelve) hours. 180 tablet 2  . levothyroxine (SYNTHROID, LEVOTHROID) 88 MCG tablet Take 88 mcg by mouth daily before breakfast.     . losartan (COZAAR) 50 MG tablet Take 1 tablet (50 mg total) by mouth daily. 30 tablet 11  . metoprolol succinate (TOPROL XL) 25 MG 24 hr tablet Take 0.5 tablets (12.5 mg total) by mouth daily. (Patient taking differently: Take 25 mg by mouth daily. ) 45 tablet  3  . rosuvastatin (CRESTOR) 10 MG tablet Take 10 mg by mouth daily.     . Testosterone 20.25 MG/ACT (1.62%) GEL Apply 2 Pump topically daily.    [name] 20 MG TABS tablet TAKE 1 TABLET ONCE DAILY. 90 tablet 2   Current Facility-Administered Medications  Medication Dose Route Frequency Provider Last Rate Last Dose  . 0.9 %  sodium chloride infusion  250 mL Intravenous Continuous [name], MD      . sodium chloride flush (NS) 0.9 %  injection 3 mL  3 mL Intravenous Q12H [name], MD      . sodium chloride flush (NS) 0.9 % injection 3 mL  3 mL Intravenous PRN [name], MD        Allergies  Allergen Reactions  . Gabapentin Other (See Comments)    Causes Extreme sedation   . Other     Not sure of name of med-for overactive bladder-caused some adverse reactions, stopped taking immediately-per patient  . Glucosamine Forte [Nutritional Supplements] Rash    Higher doses Causes Facial rash on face, lower doses are ok.     Social History   Socioeconomic History  . Marital status: Single    Spouse name: Not on file  . Number of children: Not on file  . Years of education: Not on file  . Highest education level: Not on file  Social Needs  . Financial resource strain: Not on file  . Food insecurity - worry: Not on file  . Food insecurity - inability: Not on file  . Transportation needs - medical: Not on file  . Transportation needs - non-medical: Not on file  Occupational History  . Not on file  Tobacco Use  . Smoking status: Never Smoker  . Smokeless tobacco: Never Used  Substance and Sexual Activity  . Alcohol use: No  . Drug use: No  . Sexual activity: Not on file  Other Topics Concern  . Not on file  Social History Narrative  . Not on file    Family History  Problem Relation Age of Onset  . Hypertension Mother   . Stroke Maternal Grandmother     ROS- All systems are reviewed and negative except as per the HPI above  Physical Exam: Vitals:   06/08/17 1113  BP: (!) 142/84  Pulse: 65  Weight: 175 lb (79.4 kg)  Height: 5\' 9"  (1.753 m)   Wt Readings from Last 3 Encounters:  06/08/17 175 lb (79.4 kg)  05/23/17 175 lb (79.4 kg)  05/18/17 177 lb 12.8 oz (80.6 kg)    Labs: Lab Results  Component Value Date   NA 143 05/18/2017   K 4.2 05/18/2017   CL 100 05/18/2017   CO2 26 05/18/2017   GLUCOSE 100 (H) 05/18/2017   BUN 17 05/18/2017   CREATININE 0.89 05/18/2017   CALCIUM 9.4  05/18/2017   Lab Results  Component Value Date   INR 1.4 (H) 05/18/2017   No results found for: CHOL, HDL, LDLCALC, TRIG   GEN- The patient is well appearing, alert and oriented x 3 today.   Head- normocephalic, atraumatic Eyes-  Sclera clear, conjunctiva pink Ears- hearing intact Oropharynx- clear Neck- supple, no JVP Lymph- no cervical lymphadenopathy Lungs- Clear to ausculation bilaterally, normal work of breathing Heart- Regular rate and rhythm, no murmurs, rubs or gallops, PMI not laterally displaced GI- soft, NT, ND, + BS Extremities- no clubbing, cyanosis, or edema MS- no significant deformity or atrophy Skin-  no rash or lesion Psych- euthymic mood, full affect Neuro- strength and sensation are intact  EKG-afib at 65 bpm No echo on file    Assessment and Plan: 1. Afib Has been in SR with flecainide for a number of years, but recently failed flecainide and cardioversion Discussed options, change to Tikosyn, requiring hospitalization, sotalol I don't think would be as good of an option due to brady in SR Or the other option is to proceed with ablation   He will need an updated echo He has been given the info to check price of tikosyn He is still not sure if he wants ablation locally or go back to [facility] to further discuss with Dr. Macon Large I will go ahead and request an appointment with Dr. Johney Frame which will take a few weeks, while pt is deciding where to go He will decrease flecainide to 100 mg bid since the 150 mg bid doses is not working to restore SR.  F/u after echo or as pt let me know his decision to proceed

## 2017-06-08 NOTE — Telephone Encounter (Signed)
Pt seen at Afib clinic today and AVS had mention of sodium chloride flushes that were due in Dec.  Advised pt this was likely related DCCV and to disregard this.  Pt verbalized understanding and was appreciative for call.

## 2017-06-08 NOTE — Telephone Encounter (Signed)
Follow up    Patient is calling about a sodium chloride flush that is due on May 21, 2017, what does that mean? He does not believe he had this done ?

## 2017-06-09 ENCOUNTER — Ambulatory Visit (HOSPITAL_COMMUNITY)
Admission: RE | Admit: 2017-06-09 | Discharge: 2017-06-09 | Disposition: A | Discharge status: Home / Self Care | Payer: Medicare Other | Source: Ambulatory Visit | Attending: Nurse Practitioner | Admitting: Nurse Practitioner

## 2017-06-09 DIAGNOSIS — E119 Type 2 diabetes mellitus without complications: Secondary | ICD-10-CM | POA: Insufficient documentation

## 2017-06-09 DIAGNOSIS — E785 Hyperlipidemia, unspecified: Secondary | ICD-10-CM | POA: Diagnosis not present

## 2017-06-09 DIAGNOSIS — I081 Rheumatic disorders of both mitral and tricuspid valves: Secondary | ICD-10-CM | POA: Insufficient documentation

## 2017-06-09 DIAGNOSIS — I119 Hypertensive heart disease without heart failure: Secondary | ICD-10-CM | POA: Diagnosis not present

## 2017-06-09 DIAGNOSIS — I481 Persistent atrial fibrillation: Secondary | ICD-10-CM | POA: Insufficient documentation

## 2017-06-09 DIAGNOSIS — I4819 Other persistent atrial fibrillation: Secondary | ICD-10-CM

## 2017-06-09 NOTE — Progress Notes (Signed)
  Echocardiogram 2D Echocardiogram has been performed.  Anthony SavoyCasey N Dory Cole 06/09/2017, 10:58 AM

## 2017-06-22 ENCOUNTER — Ambulatory Visit: Payer: Medicare Other

## 2017-06-24 ENCOUNTER — Ambulatory Visit: Payer: Medicare Other | Admitting: Internal Medicine

## 2017-06-24 ENCOUNTER — Encounter: Payer: Self-pay | Admitting: Internal Medicine

## 2017-06-24 VITALS — BP 134/82 | HR 82 | Ht 69.0 in | Wt 180.6 lb

## 2017-06-24 DIAGNOSIS — I4819 Other persistent atrial fibrillation: Secondary | ICD-10-CM

## 2017-06-24 DIAGNOSIS — I481 Persistent atrial fibrillation: Secondary | ICD-10-CM

## 2017-06-24 NOTE — Patient Instructions (Addendum)
Medication Instructions:  Your physician recommends that you continue on your current medications as directed. Please refer to the Current Medication list given to you today.   Labwork: None ordered.  Testing/Procedures: None ordered.  Follow-Up: Your physician recommends that you schedule a follow-up appointment as needed with Dr Allred.   Any Other Special Instructions Will Be Listed Below (If Applicable).     If you need a refill on your cardiac medications before your next appointment, please call your pharmacy.   

## 2017-06-24 NOTE — Progress Notes (Signed)
Electrophysiology Office Note   Date:  06/24/2017   ID:  Anthony SabinaRoderick B Kerth, DOB 1951-03-12, MRN [id number]  PCP:  Daisy Florooss, Charles Alan, MD  Cardiologist:  Dr Katrinka BlazingSmith Primary Electrophysiologist: Hillis RangeJames Kenlyn Lose, MD    Chief Complaint  Patient presents with  . Atrial Fibrillation     History of Present Illness: Anthony Cole is a 67 y.o. male who presents today for electrophysiology evaluation.   He is referred by Dr Katrinka BlazingSmith and Rudi Cocoonna Carroll NP with the afib clinic for EP consultation regarding his afib.  He has had episodes of AF since 2013.  He has required cardioversion previously.  He has been treated with both amiodarone and flecainide.  Unfortunately, over the past 2 years he had increasing frequency and duration of atrial fibrillation. He saw Dr Macon LargeBahnson 5 years ago but did not pursue ablation. He is active.  Otherwise doing well. Today, he denies symptoms of palpitations, chest pain, shortness of breath, orthopnea, PND, lower extremity edema, claudication, dizziness, presyncope, syncope, bleeding, or neurologic sequela. The patient is tolerating medications without difficulties and is otherwise without complaint today.    Past Medical History:  Diagnosis Date  . Atrial fibrillation (HCC)   . Diabetes mellitus without complication (HCC)   . Hyperlipidemia   . Hypertension   . Hypothyroidism   . Obesity (BMI 30-39.9) 01/25/2016  . OSA (obstructive sleep apnea) 07/16/2014   Moderate with AHI 19/hr on CPAP at 8cm H2O   Past Surgical History:  Procedure Laterality Date  . CARDIOVERSION  11/04/2011   Procedure: CARDIOVERSION;  Surgeon: Lesleigh NoeHenry W Smith III, MD;  [facility];  Service: Cardiovascular;  Laterality: N/A;  . CARDIOVERSION N/A 07/02/2013   Procedure: CARDIOVERSION;  Surgeon: Lesleigh NoeHenry W Smith III, MD;  [facility];  Service: Cardiovascular;  Laterality: N/A;  10:07 elective cardioversion Lido 40mg , Propofol 60mg ,IV...200 joules, synched, pt presently in A  flutter,...cardioverted to NSR...  . CARDIOVERSION N/A 09/19/2013   Procedure: CARDIOVERSION;  Surgeon: Lesleigh NoeHenry W Smith III, MD;  [facility];  Service: Cardiovascular;  Laterality: N/A;  . CARDIOVERSION N/A 08/13/2014   Procedure: CARDIOVERSION;  Surgeon: Lars MassonKatarina H Nelson, MD;  [facility];  Service: Cardiovascular;  Laterality: N/A;  . CARDIOVERSION N/A 05/23/2017   Procedure: CARDIOVERSION;  Surgeon: Vesta MixerNahser, Philip J, MD;  [facility];  Service: Cardiovascular;  Laterality: N/A;  . FINGER SURGERY Left   . KNEE ARTHROSCOPY W/ DEBRIDEMENT Bilateral   . ROTATOR CUFF REPAIR Bilateral   . STAPEDES SURGERY Right   . TONSILLECTOMY       Current Outpatient Medications  Medication Sig Dispense Refill  . acyclovir (ZOVIRAX) 200 MG capsule Take 200 mg by mouth once as needed (first sign of fever blister).     [name]. acyclovir ointment (ZOVIRAX) 5 % Apply 1 application topically daily as needed (first sign of fever blister).    [name]. doxycycline (VIBRA-TABS) 100 MG tablet Take 100 mg by mouth 2 (two) times daily as needed (rosacea).    . flecainide (TAMBOCOR) 100 MG tablet Take 1 tablet (100 mg total) by mouth every 12 (twelve) hours. 180 tablet 2  . levothyroxine (SYNTHROID, LEVOTHROID) 88 MCG tablet Take 88 mcg by mouth daily before breakfast.     . metoprolol succinate (TOPROL-XL) 25 MG 24 hr tablet Take 25 mg by mouth daily.    . rosuvastatin (CRESTOR) 10 MG tablet Take 10 mg by mouth daily.     [name]. XARELTO 20 MG TABS tablet TAKE 1 TABLET ONCE  DAILY. 90 tablet 2  . losartan (COZAAR) 50 MG tablet Take 1 tablet (50 mg total) by mouth daily. 30 tablet 11   Current Facility-Administered Medications  Medication Dose Route Frequency Provider Last Rate Last Dose  . 0.9 %  sodium chloride infusion  250 mL Intravenous Continuous [name], MD      . sodium chloride flush (NS) 0.9 % injection 3 mL  3 mL Intravenous Q12H [name], MD      . sodium chloride flush (NS) 0.9 %  injection 3 mL  3 mL Intravenous PRN [name], MD        Allergies:   Gabapentin; Other; and Glucosamine forte [nutritional supplements]   Social History:  The patient  reports that  has never smoked. he has never used smokeless tobacco. He reports that he does not drink alcohol or use drugs.   Family History:  The patient's  family history includes Hypertension in his mother; Stroke in his maternal grandmother.    ROS:  Please see the history of present illness.   All other systems are personally reviewed and negative.    PHYSICAL EXAM: VS:  BP 134/82   Pulse 82   Ht 5\' 9"  (1.753 m)   Wt 180 lb 9.6 oz (81.9 kg)   SpO2 98%   BMI 26.67 kg/m  , BMI Body mass index is 26.67 kg/m. GEN: Well nourished, well developed, in no acute distress  HEENT: normal  Neck: no JVD, carotid bruits, or masses Cardiac: iRRR; no murmurs, rubs, or gallops,no edema  Respiratory:  clear to auscultation bilaterally, normal work of breathing GI: soft, nontender, nondistended, + BS MS: no deformity or atrophy  Skin: warm and dry  Neuro:  Strength and sensation are intact Psych: euthymic mood, full affect  EKG:  EKG 06/08/17 reveals rate controlled afib   Recent Labs: 05/18/2017: BUN 17; Creatinine, Ser 0.89; Hemoglobin 14.1; Platelets 286; Potassium 4.2; Sodium 143  personally reviewed   Lipid Panel  No results found for: CHOL, TRIG, HDL, CHOLHDL, VLDL, LDLCALC, LDLDIRECT personally reviewed   Wt Readings from Last 3 Encounters:  06/24/17 180 lb 9.6 oz (81.9 kg)  06/08/17 175 lb (79.4 kg)  05/23/17 175 lb (79.4 kg)      Other studies personally reviewed: Additional studies/ records that were reviewed today include: AF clinic notes, recent echo  Review of the above records today demonstrates: as above   ASSESSMENT AND PLAN:  1.  Persistent atrial fibrillation The patient has symptomatic atrial fibrillation.  He has failed medical therapy with amiodarone and flecainide.  He presents  with a host of questions today.  We discussed his afib at length.  We discussed options of tikosyn and ablation.  I think that he is a reasonable ablation candidate.  Given persistent afib, we discussed anticipated success rates with ablation of 65-70 % with the potential of multiple procedure required.  After our very long conversation, he informed me that he intended to go to [facility] to revisit ablation with Dr Macon Large.  I will see as needed going forward.   Current medicines are reviewed at length with the patient today.   The patient does not have concerns regarding his medicines.  The following changes were made today:  non   Signed, Hillis Range, MD  06/24/2017 2:12 PM     [facility] [address] [place] [address] [phone] (office) [phone] (fax)

## 2017-06-28 ENCOUNTER — Ambulatory Visit: Payer: Medicare Other | Admitting: Nurse Practitioner

## 2017-08-01 ENCOUNTER — Telehealth: Payer: Self-pay | Admitting: Internal Medicine

## 2017-08-01 NOTE — Telephone Encounter (Signed)
Will send to Dr Johney FrameAllred and nurse for scheduling .Zack Seal/cy

## 2017-08-01 NOTE — Telephone Encounter (Signed)
New message  Pt verbalized that he is calling for the RN  For the pt to schedule his ablation with Dr.Allred

## 2017-08-03 ENCOUNTER — Encounter (HOSPITAL_COMMUNITY): Payer: Self-pay | Admitting: *Deleted

## 2017-08-03 ENCOUNTER — Other Ambulatory Visit (HOSPITAL_COMMUNITY): Payer: Self-pay | Admitting: *Deleted

## 2017-08-03 ENCOUNTER — Telehealth (HOSPITAL_COMMUNITY): Payer: Self-pay | Admitting: *Deleted

## 2017-08-03 DIAGNOSIS — I4819 Other persistent atrial fibrillation: Secondary | ICD-10-CM

## 2017-08-03 NOTE — Telephone Encounter (Signed)
Scheduled by Stacy in Afib.

## 2017-08-03 NOTE — Telephone Encounter (Signed)
duplicate

## 2017-08-11 ENCOUNTER — Other Ambulatory Visit: Payer: Medicare Other | Admitting: *Deleted

## 2017-08-11 ENCOUNTER — Other Ambulatory Visit: Payer: Self-pay

## 2017-08-11 DIAGNOSIS — I4819 Other persistent atrial fibrillation: Secondary | ICD-10-CM

## 2017-08-11 LAB — CBC
HEMATOCRIT: 42.5 % (ref 37.5–51.0)
HEMOGLOBIN: 14.7 g/dL (ref 13.0–17.7)
MCH: 30.8 pg (ref 26.6–33.0)
MCHC: 34.6 g/dL (ref 31.5–35.7)
MCV: 89 fL (ref 79–97)
Platelets: 252 10*3/uL (ref 150–379)
RBC: 4.78 x10E6/uL (ref 4.14–5.80)
RDW: 14.1 % (ref 12.3–15.4)
WBC: 9.1 10*3/uL (ref 3.4–10.8)

## 2017-08-11 LAB — BASIC METABOLIC PANEL
BUN/Creatinine Ratio: 25 — ABNORMAL HIGH (ref 10–24)
BUN: 23 mg/dL (ref 8–27)
CALCIUM: 9.9 mg/dL (ref 8.6–10.2)
CO2: 30 mmol/L — AB (ref 20–29)
CREATININE: 0.93 mg/dL (ref 0.76–1.27)
Chloride: 98 mmol/L (ref 96–106)
GFR calc Af Amer: 99 mL/min/{1.73_m2} (ref >59)
GFR calc non Af Amer: 86 mL/min/{1.73_m2} (ref >59)
GLUCOSE: 103 mg/dL — AB (ref 65–99)
POTASSIUM: 3.9 mmol/L (ref 3.5–5.2)
SODIUM: 140 mmol/L (ref 134–144)

## 2017-08-18 ENCOUNTER — Other Ambulatory Visit: Payer: Self-pay | Admitting: *Deleted

## 2017-08-18 DIAGNOSIS — I4819 Other persistent atrial fibrillation: Secondary | ICD-10-CM

## 2017-08-19 ENCOUNTER — Ambulatory Visit (HOSPITAL_COMMUNITY): Payer: Medicare Other

## 2017-08-19 ENCOUNTER — Ambulatory Visit (HOSPITAL_COMMUNITY)
Admission: RE | Admit: 2017-08-19 | Discharge: 2017-08-19 | Disposition: A | Discharge status: Home / Self Care | Payer: Medicare Other | Source: Ambulatory Visit | Attending: Internal Medicine | Admitting: Internal Medicine

## 2017-08-19 DIAGNOSIS — I281 Aneurysm of pulmonary artery: Secondary | ICD-10-CM | POA: Diagnosis not present

## 2017-08-19 DIAGNOSIS — I4891 Unspecified atrial fibrillation: Secondary | ICD-10-CM | POA: Diagnosis not present

## 2017-08-19 DIAGNOSIS — R918 Other nonspecific abnormal finding of lung field: Secondary | ICD-10-CM | POA: Diagnosis not present

## 2017-08-19 DIAGNOSIS — I481 Persistent atrial fibrillation: Secondary | ICD-10-CM | POA: Diagnosis present

## 2017-08-19 DIAGNOSIS — I700 Atherosclerosis of aorta: Secondary | ICD-10-CM | POA: Insufficient documentation

## 2017-08-19 DIAGNOSIS — I4819 Other persistent atrial fibrillation: Secondary | ICD-10-CM

## 2017-08-19 MED ORDER — IOPAMIDOL (ISOVUE-370) INJECTION 76%
INTRAVENOUS | Status: AC
  Filled 2017-08-19: qty 100

## 2017-08-19 MED ORDER — METOPROLOL TARTRATE 5 MG/5ML IV SOLN
INTRAVENOUS | Status: AC
  Administered 2017-08-19: 5 mg
  Filled 2017-08-19: qty 5

## 2017-08-19 NOTE — Progress Notes (Signed)
Patient given  Sprite and crackers to drink. Patient had ambulatory gait out of department alone. Stable at time of d/c.

## 2017-08-24 ENCOUNTER — Telehealth: Payer: Self-pay | Admitting: Internal Medicine

## 2017-08-24 NOTE — Telephone Encounter (Signed)
Returned pt's call; he was concerned about his CT scan results. I assured him that if there was anything serious that would alter his procedure date, he would be called. I advised him to discuss his CT results with Dr Johney FrameAllred before his procedure tomorrow if he had further concerns. I reviewed his pre procedure instructions with him and he had no additional questions.

## 2017-08-24 NOTE — Telephone Encounter (Signed)
New message    Patient calling for CT results. Patient also requesting instruction for procedure on 08/25/2017.

## 2017-08-24 NOTE — Anesthesia Preprocedure Evaluation (Addendum)
Anesthesia Evaluation  Patient identified by MRN, date of birth, ID band Patient awake    Reviewed: Allergy & Precautions, NPO status , Patient's Chart, lab work & pertinent test results  History of Anesthesia Complications Negative for: history of anesthetic complications  Airway Mallampati: II  TM Distance: >3 FB Neck ROM: Full    Dental  (+) Dental Advisory Given, Caps   Pulmonary sleep apnea and Continuous Positive Airway Pressure Ventilation ,    breath sounds clear to auscultation       Cardiovascular hypertension, Pt. on medications + dysrhythmias Atrial Fibrillation  Rhythm:Regular Rate:Normal  1/19 ECHO: EF 55-60%, mild MR, mild TR   Neuro/Psych negative neurological ROS     GI/Hepatic negative GI ROS, Neg liver ROS,   Endo/Other  diabetes (glu 133)Hypothyroidism   Renal/GU negative Renal ROS     Musculoskeletal   Abdominal   Peds  Hematology Xarelto   Anesthesia Other Findings   Reproductive/Obstetrics                            Anesthesia Physical Anesthesia Plan  ASA: III  Anesthesia Plan: General   Post-op Pain Management:    Induction:   PONV Risk Score and Plan: 2 and Ondansetron and Dexamethasone  Airway Management Planned: LMA  Additional Equipment:   Intra-op Plan:   Post-operative Plan:   Informed Consent: I have reviewed the patients History and Physical, chart, labs and discussed the procedure including the risks, benefits and alternatives for the proposed anesthesia with the patient or authorized representative who has indicated his/her understanding and acceptance.   Dental advisory given  Plan Discussed with: CRNA and Surgeon  Anesthesia Plan Comments: (Plan routine monitors, GA- LMA OK)        Anesthesia Quick Evaluation

## 2017-08-25 ENCOUNTER — Other Ambulatory Visit: Payer: Self-pay

## 2017-08-25 ENCOUNTER — Encounter (HOSPITAL_COMMUNITY): Payer: Self-pay | Admitting: Internal Medicine

## 2017-08-25 ENCOUNTER — Ambulatory Visit (HOSPITAL_COMMUNITY): Payer: Medicare Other | Admitting: Anesthesiology

## 2017-08-25 ENCOUNTER — Ambulatory Visit (HOSPITAL_COMMUNITY)
Admission: RE | Admit: 2017-08-25 | Discharge: 2017-08-26 | Disposition: A | Discharge status: Home / Self Care | Payer: Medicare Other | Source: Ambulatory Visit | Attending: Internal Medicine | Admitting: Internal Medicine

## 2017-08-25 ENCOUNTER — Encounter (HOSPITAL_COMMUNITY)
Admission: RE | Disposition: A | Discharge status: Home / Self Care | Payer: Self-pay | Source: Ambulatory Visit | Attending: Internal Medicine

## 2017-08-25 DIAGNOSIS — E039 Hypothyroidism, unspecified: Secondary | ICD-10-CM | POA: Diagnosis not present

## 2017-08-25 DIAGNOSIS — E669 Obesity, unspecified: Secondary | ICD-10-CM | POA: Diagnosis not present

## 2017-08-25 DIAGNOSIS — I481 Persistent atrial fibrillation: Secondary | ICD-10-CM | POA: Diagnosis not present

## 2017-08-25 DIAGNOSIS — E785 Hyperlipidemia, unspecified: Secondary | ICD-10-CM | POA: Diagnosis not present

## 2017-08-25 DIAGNOSIS — E119 Type 2 diabetes mellitus without complications: Secondary | ICD-10-CM | POA: Insufficient documentation

## 2017-08-25 DIAGNOSIS — Z6827 Body mass index (BMI) 27.0-27.9, adult: Secondary | ICD-10-CM | POA: Insufficient documentation

## 2017-08-25 DIAGNOSIS — G4733 Obstructive sleep apnea (adult) (pediatric): Secondary | ICD-10-CM | POA: Diagnosis not present

## 2017-08-25 DIAGNOSIS — Z8249 Family history of ischemic heart disease and other diseases of the circulatory system: Secondary | ICD-10-CM | POA: Insufficient documentation

## 2017-08-25 DIAGNOSIS — Z7901 Long term (current) use of anticoagulants: Secondary | ICD-10-CM | POA: Diagnosis not present

## 2017-08-25 DIAGNOSIS — I10 Essential (primary) hypertension: Secondary | ICD-10-CM | POA: Insufficient documentation

## 2017-08-25 DIAGNOSIS — I4819 Other persistent atrial fibrillation: Secondary | ICD-10-CM | POA: Diagnosis present

## 2017-08-25 HISTORY — DX: Dependence on other enabling machines and devices: Z99.89

## 2017-08-25 HISTORY — DX: Obstructive sleep apnea (adult) (pediatric): G47.33

## 2017-08-25 HISTORY — PX: ATRIAL FIBRILLATION ABLATION: EP1191

## 2017-08-25 HISTORY — DX: Prediabetes: R73.03

## 2017-08-25 LAB — GLUCOSE, CAPILLARY
GLUCOSE-CAPILLARY: 235 mg/dL — AB (ref 65–99)
GLUCOSE-CAPILLARY: 179 mg/dL — AB (ref 65–99)
Glucose-Capillary: 133 mg/dL — ABNORMAL HIGH (ref 65–99)
Glucose-Capillary: 135 mg/dL — ABNORMAL HIGH (ref 65–99)
Glucose-Capillary: 263 mg/dL — ABNORMAL HIGH (ref 65–99)

## 2017-08-25 LAB — POCT ACTIVATED CLOTTING TIME
ACTIVATED CLOTTING TIME: 318 s
ACTIVATED CLOTTING TIME: 323 s
Activated Clotting Time: 296 seconds
Activated Clotting Time: 191 seconds
Activated Clotting Time: 191 seconds

## 2017-08-25 SURGERY — ATRIAL FIBRILLATION ABLATION
Anesthesia: General

## 2017-08-25 MED ORDER — ACETAMINOPHEN 325 MG PO TABS
650.0000 mg | ORAL_TABLET | ORAL | Status: DC | PRN
  Administered 2017-08-25 – 2017-08-26 (×3): 650 mg via ORAL
  Filled 2017-08-25 (×3): qty 2

## 2017-08-25 MED ORDER — FENTANYL CITRATE (PF) 100 MCG/2ML IJ SOLN: 25.0000 ug | INTRAMUSCULAR | Status: DC | PRN

## 2017-08-25 MED ORDER — BUPIVACAINE HCL (PF) 0.25 % IJ SOLN
INTRAMUSCULAR | Status: DC | PRN
  Administered 2017-08-25: 30 mL

## 2017-08-25 MED ORDER — PROTAMINE SULFATE 10 MG/ML IV SOLN
INTRAVENOUS | Status: DC | PRN
  Administered 2017-08-25: 30 mg via INTRAVENOUS

## 2017-08-25 MED ORDER — INSULIN ASPART 100 UNIT/ML ~~LOC~~ SOLN: 0.0000 [IU] | Freq: Three times a day (TID) | SUBCUTANEOUS | Status: DC

## 2017-08-25 MED ORDER — EPHEDRINE SULFATE 50 MG/ML IJ SOLN
INTRAMUSCULAR | Status: DC | PRN
  Administered 2017-08-25 (×4): 5 mg via INTRAVENOUS

## 2017-08-25 MED ORDER — MIDAZOLAM HCL 5 MG/5ML IJ SOLN
INTRAMUSCULAR | Status: DC | PRN
  Administered 2017-08-25: 2 mg via INTRAVENOUS

## 2017-08-25 MED ORDER — ONDANSETRON HCL 4 MG/2ML IJ SOLN: 4.0000 mg | Freq: Four times a day (QID) | INTRAMUSCULAR | Status: DC | PRN

## 2017-08-25 MED ORDER — RIVAROXABAN 20 MG PO TABS
20.0000 mg | ORAL_TABLET | Freq: Every day | ORAL | Status: DC
  Administered 2017-08-25: 20 mg via ORAL
  Filled 2017-08-25: qty 1

## 2017-08-25 MED ORDER — HYDROCODONE-ACETAMINOPHEN 5-325 MG PO TABS: 1.0000 | ORAL_TABLET | ORAL | Status: DC | PRN

## 2017-08-25 MED ORDER — INSULIN ASPART 100 UNIT/ML ~~LOC~~ SOLN
0.0000 [IU] | Freq: Three times a day (TID) | SUBCUTANEOUS | Status: DC
  Administered 2017-08-26: 07:00:00 2 [IU] via SUBCUTANEOUS

## 2017-08-25 MED ORDER — BUPIVACAINE HCL (PF) 0.25 % IJ SOLN
INTRAMUSCULAR | Status: AC
  Filled 2017-08-25: qty 30

## 2017-08-25 MED ORDER — SODIUM CHLORIDE 0.9% FLUSH: 3.0000 mL | INTRAVENOUS | Status: DC | PRN

## 2017-08-25 MED ORDER — HEPARIN SODIUM (PORCINE) 1000 UNIT/ML IJ SOLN
INTRAMUSCULAR | Status: AC
  Filled 2017-08-25: qty 1

## 2017-08-25 MED ORDER — SODIUM CHLORIDE 0.9 % IV SOLN
INTRAVENOUS | Status: DC | PRN
  Administered 2017-08-25: 08:00:00 via INTRAVENOUS

## 2017-08-25 MED ORDER — IOPAMIDOL (ISOVUE-370) INJECTION 76%
INTRAVENOUS | Status: DC | PRN
  Administered 2017-08-25: 3 mL

## 2017-08-25 MED ORDER — SODIUM CHLORIDE 0.9 % IV SOLN: 250.0000 mL | INTRAVENOUS | Status: DC | PRN

## 2017-08-25 MED ORDER — SODIUM CHLORIDE 0.9% FLUSH
3.0000 mL | Freq: Two times a day (BID) | INTRAVENOUS | Status: DC
  Administered 2017-08-25: 3 mL via INTRAVENOUS

## 2017-08-25 MED ORDER — LEVOTHYROXINE SODIUM 88 MCG PO TABS
88.0000 ug | ORAL_TABLET | Freq: Every day | ORAL | Status: DC
  Administered 2017-08-25 – 2017-08-26 (×2): 88 ug via ORAL
  Filled 2017-08-25 (×2): qty 1

## 2017-08-25 MED ORDER — IOPAMIDOL (ISOVUE-370) INJECTION 76%
INTRAVENOUS | Status: AC
  Filled 2017-08-25: qty 50

## 2017-08-25 MED ORDER — INSULIN ASPART 100 UNIT/ML ~~LOC~~ SOLN: 0.0000 [IU] | Freq: Every day | SUBCUTANEOUS | Status: DC

## 2017-08-25 MED ORDER — HEPARIN (PORCINE) IN NACL 2-0.9 UNIT/ML-% IJ SOLN
INTRAMUSCULAR | Status: AC
  Filled 2017-08-25: qty 500

## 2017-08-25 MED ORDER — OFF THE BEAT BOOK
Freq: Once | Status: AC
  Administered 2017-08-25: 23:00:00
  Filled 2017-08-25: qty 1

## 2017-08-25 MED ORDER — METOPROLOL SUCCINATE ER 25 MG PO TB24
25.0000 mg | ORAL_TABLET | Freq: Every day | ORAL | Status: DC
  Administered 2017-08-26: 09:00:00 25 mg via ORAL
  Filled 2017-08-25 (×2): qty 1

## 2017-08-25 MED ORDER — LOSARTAN POTASSIUM 50 MG PO TABS
50.0000 mg | ORAL_TABLET | Freq: Every day | ORAL | Status: DC
Start: 2017-08-26 — End: 2017-08-26
  Administered 2017-08-25 – 2017-08-26 (×2): 50 mg via ORAL
  Filled 2017-08-25 (×2): qty 1

## 2017-08-25 MED ORDER — PROPOFOL 10 MG/ML IV BOLUS
INTRAVENOUS | Status: DC | PRN
  Administered 2017-08-25: 150 mg via INTRAVENOUS

## 2017-08-25 MED ORDER — MEPERIDINE HCL 25 MG/ML IJ SOLN: 6.2500 mg | INTRAMUSCULAR | Status: DC | PRN

## 2017-08-25 MED ORDER — HEPARIN (PORCINE) IN NACL 2-0.9 UNIT/ML-% IJ SOLN
INTRAMUSCULAR | Status: AC | PRN
  Administered 2017-08-25: 500 mL

## 2017-08-25 MED ORDER — ONDANSETRON HCL 4 MG/2ML IJ SOLN
INTRAMUSCULAR | Status: DC | PRN
  Administered 2017-08-25: 4 mg via INTRAVENOUS

## 2017-08-25 MED ORDER — FENTANYL CITRATE (PF) 250 MCG/5ML IJ SOLN
INTRAMUSCULAR | Status: DC | PRN
  Administered 2017-08-25 (×2): 25 ug via INTRAVENOUS
  Administered 2017-08-25: 50 ug via INTRAVENOUS

## 2017-08-25 MED ORDER — PROMETHAZINE HCL 25 MG/ML IJ SOLN
6.2500 mg | INTRAMUSCULAR | Status: DC | PRN
Start: 2017-08-25 — End: 2017-08-25

## 2017-08-25 MED ORDER — HEPARIN SODIUM (PORCINE) 1000 UNIT/ML IJ SOLN
INTRAMUSCULAR | Status: DC | PRN
  Administered 2017-08-25 (×3): 3000 [IU] via INTRAVENOUS

## 2017-08-25 MED ORDER — HEPARIN SODIUM (PORCINE) 1000 UNIT/ML IJ SOLN
INTRAMUSCULAR | Status: DC | PRN
  Administered 2017-08-25: 12000 [IU] via INTRAVENOUS
  Administered 2017-08-25 (×2): 1000 [IU] via INTRAVENOUS

## 2017-08-25 MED ORDER — MIDAZOLAM HCL 2 MG/2ML IJ SOLN: 0.5000 mg | Freq: Once | INTRAMUSCULAR | Status: DC | PRN

## 2017-08-25 MED ORDER — DEXAMETHASONE SODIUM PHOSPHATE 10 MG/ML IJ SOLN
INTRAMUSCULAR | Status: DC | PRN
  Administered 2017-08-25: 10 mg via INTRAVENOUS

## 2017-08-25 MED ORDER — LIDOCAINE 2% (20 MG/ML) 5 ML SYRINGE
INTRAMUSCULAR | Status: DC | PRN
Start: 2017-08-25 — End: 2017-08-25
  Administered 2017-08-25: 40 mg via INTRAVENOUS

## 2017-08-25 SURGICAL SUPPLY — 17 items
BLANKET WARM UNDERBOD FULL ACC (MISCELLANEOUS) ×3 IMPLANT
CATH MAPPNG PENTARAY F 2-6-2MM (CATHETERS) ×1 IMPLANT
CATH NAVISTAR SMARTTOUCH DF (ABLATOR) ×3 IMPLANT
CATH SOUNDSTAR 3D IMAGING (CATHETERS) ×3 IMPLANT
CATH WEBSTER BI DIR CS D-F CRV (CATHETERS) ×3 IMPLANT
COVER SWIFTLINK CONNECTOR (BAG) ×3 IMPLANT
NEEDLE TRANSEP BRK 71CM 407200 (NEEDLE) ×3 IMPLANT
PACK EP LATEX FREE (CUSTOM PROCEDURE TRAY) ×2
PACK EP LF (CUSTOM PROCEDURE TRAY) ×1 IMPLANT
PAD DEFIB LIFELINK (PAD) ×3 IMPLANT
PATCH CARTO3 (PAD) ×3 IMPLANT
PENTARAY F 2-6-2MM (CATHETERS) ×3
SHEATH AVANTI 11CM 7FR (SHEATH) ×6 IMPLANT
SHEATH AVANTI 11CM 9FR (SHEATH) ×3 IMPLANT
SHEATH AVANTI 11F 11CM (SHEATH) ×3 IMPLANT
SHEATH SWARTZ TS SL2 63CM 8.5F (SHEATH) ×3 IMPLANT
TUBING SMART ABLATE COOLFLOW (TUBING) ×3 IMPLANT

## 2017-08-25 NOTE — H&P (Signed)
Chief Complaint  Patient presents with  . Atrial Fibrillation     History of Present Illness: Anthony Cole is a 67 y.o. male who presents today for electrophysiology study and ablation for atrial fibrillation.  He has had episodes of AF since 2013.  He has required cardioversion previously.  He has been treated with both amiodarone and flecainide.  Unfortunately, over the past 2 years he had increasing frequency and duration of atrial fibrillation. He saw Dr Anthony Cole 5 years ago but did not pursue ablation. He is active.  Otherwise doing well. Today, he denies symptoms of palpitations, chest pain, shortness of breath, orthopnea, PND, lower extremity edema, claudication, dizziness, presyncope, syncope, bleeding, or neurologic sequela. The patient is tolerating medications without difficulties and is otherwise without complaint today.        Past Medical History:  Diagnosis Date  . Atrial fibrillation (HCC)   . Diabetes mellitus without complication (HCC)   . Hyperlipidemia   . Hypertension   . Hypothyroidism   . Obesity (BMI 30-39.9) 01/25/2016  . OSA (obstructive sleep apnea) 07/16/2014   Moderate with AHI 19/hr on CPAP at 8cm H2O        Past Surgical History:  Procedure Laterality Date  . CARDIOVERSION  11/04/2011   Procedure: CARDIOVERSION;  Surgeon: Anthony Noe, MD;  Location: [facility] OR;  Service: Cardiovascular;  Laterality: N/A;  . CARDIOVERSION N/A 07/02/2013   Procedure: CARDIOVERSION;  Surgeon: Anthony Noe, MD;  Location: [facility] ENDOSCOPY;  Service: Cardiovascular;  Laterality: N/A;  10:07 elective cardioversion Lido 40mg , Propofol 60mg ,IV...200 joules, synched, pt presently in A flutter,...cardioverted to NSR...  . CARDIOVERSION N/A 09/19/2013   Procedure: CARDIOVERSION;  Surgeon: Anthony Noe, MD;  Location: [facility] ENDOSCOPY;  Service: Cardiovascular;  Laterality: N/A;  . CARDIOVERSION N/A 08/13/2014   Procedure: CARDIOVERSION;  Surgeon: Anthony Masson,  MD;  Location: [facility] ENDOSCOPY;  Service: Cardiovascular;  Laterality: N/A;  . CARDIOVERSION N/A 05/23/2017   Procedure: CARDIOVERSION;  Surgeon: Anthony Mixer, MD;  Location: [facility] ENDOSCOPY;  Service: Cardiovascular;  Laterality: N/A;  . FINGER SURGERY Left   . KNEE ARTHROSCOPY W/ DEBRIDEMENT Bilateral   . ROTATOR CUFF REPAIR Bilateral   . STAPEDES SURGERY Right   . TONSILLECTOMY             Current Outpatient Medications  Medication Sig Dispense Refill  . acyclovir (ZOVIRAX) 200 MG capsule Take 200 mg by mouth once as needed (first sign of fever blister).     [name] acyclovir ointment (ZOVIRAX) 5 % Apply 1 application topically daily as needed (first sign of fever blister).    [name] doxycycline (VIBRA-TABS) 100 MG tablet Take 100 mg by mouth 2 (two) times daily as needed (rosacea).    . flecainide (TAMBOCOR) 100 MG tablet Take 1 tablet (100 mg total) by mouth every 12 (twelve) hours. 180 tablet 2  . levothyroxine (SYNTHROID, LEVOTHROID) 88 MCG tablet Take 88 mcg by mouth daily before breakfast.     . metoprolol succinate (TOPROL-XL) 25 MG 24 hr tablet Take 25 mg by mouth daily.    . rosuvastatin (CRESTOR) 10 MG tablet Take 10 mg by mouth daily.     Anthony Cole 20 MG TABS tablet TAKE 1 TABLET ONCE DAILY. 90 tablet 2  . losartan (COZAAR) 50 MG tablet Take 1 tablet (50 mg total) by mouth daily. 30 tablet 11            Current Facility-Administered Medications  Medication Dose Route Frequency  Provider Last Rate Last Dose  . 0.9 %  sodium chloride infusion  250 mL Intravenous Continuous Anthony Cole, Anthony W, MD      . sodium chloride flush (NS) 0.9 % injection 3 mL  3 mL Intravenous Q12H Anthony Cole, Anthony W, MD      . sodium chloride flush (NS) 0.9 % injection 3 mL  3 mL Intravenous PRN Anthony Cole, Anthony W, MD        Allergies:   Gabapentin; Other; and Glucosamine forte [nutritional supplements]   Social History:  The patient  reports that  has never smoked. he has never used smokeless  tobacco. He reports that he does not drink alcohol or use drugs.   Family History:  The patient's  family history includes Hypertension in his mother; Stroke in his maternal grandmother.    ROS:  Please see the history of present illness.   All other systems are personally reviewed and negative.    PHYSICAL EXAM: Vitals:   08/25/17 0559 08/25/17 0639  BP: (!) 152/88   Pulse: 64   Resp:  18  Temp: 97.6 F (36.4 C)   SpO2: 98%    GEN: Well nourished, well developed, in no acute distress  HEENT: normal  Neck: no JVD, carotid bruits, or masses Cardiac: iRRR; no murmurs, rubs, or gallops,no edema  Respiratory:  clear to auscultation bilaterally, normal work of breathing GI: soft, nontender, nondistended, + BS MS: no deformity or atrophy  Skin: warm and dry  Neuro:  Strength and sensation are intact Psych: euthymic mood, full affect  EKG:  EKG 06/08/17 reveals rate controlled afib      Wt Readings from Last 3 Encounters:  06/24/17 180 lb 9.6 oz (81.9 kg)  06/08/17 175 lb (79.4 kg)  05/23/17 175 lb (79.4 kg)     ASSESSMENT AND PLAN:  1.  Persistent atrial fibrillation The patient has symptomatic atrial fibrillation.  He has failed medical therapy with amiodarone and flecainide. Therapeutic strategies for afib including medicine and ablation were discussed in detail with the patient today. Risk, benefits, and alternatives to EP study and radiofrequency ablation for afib were also discussed in detail today. These risks include but are not limited to stroke, bleeding, vascular damage, tamponade, perforation, damage to the esophagus, lungs, and other structures, pulmonary vein stenosis, worsening renal function, and death. The patient understands these risk and wishes to proceed.  He reports compliance with xarelto without interruption.  Cardiac CT reviewed with patient today.  Hillis RangeJames Murat Rideout MD, [facility] 08/25/2017 7:18 AM

## 2017-08-25 NOTE — Progress Notes (Signed)
Site area: rt groin fv sheaths x3  Pulled and pressure held by Nita SellsJohn Nivens Site Prior to Removal:  Level 0 Pressure Applied For:  20 minutes Manual:   yes Patient Status During Pull:  stable Post Pull Site:  Level  0 Post Pull Instructions Given:  yes Post Pull Pulses Present: palpable rt dp Dressing Applied:  Gauze and tegaderm Bedrest begins @ 1145 Comments:  IV saline locked

## 2017-08-25 NOTE — Discharge Summary (Addendum)
ELECTROPHYSIOLOGY PROCEDURE DISCHARGE SUMMARY    Patient ID: Anthony Cole,  MRN: [id number], DOB/AGE: 07/17/1950 67 y.o.  Admit date: 08/25/2017 Discharge date: 08/26/17  Primary Care Physician: Daisy Florooss, Charles Alan, MD Primary Cardiologist: Dr. Katrinka BlazingSmith Electrophysiologist: Hillis RangeJames Allred, MD  Primary Discharge Diagnosis:  1. Persistent Afib     CHA2DS2Vasc is 3, on xarelto  Secondary Discharge Diagnosis:  1. HTN 2. DM  Procedures This Admission:  1.  Electrophysiology study and radiofrequency catheter ablation on 08/25/17 by Dr Hillis RangeJames Allred.  This study demonstrated   CONCLUSIONS: 1. Atrial fibrillation upon presentation.   2. Intracardiac echo reveals a moderately enlarged left atrium with four separate pulmonary veins without evidence of pulmonary vein stenosis. 3. Successful electrical isolation and anatomical encircling of all four pulmonary veins with radiofrequency current.  A WACA approach was used 3. Additional left atrial ablation was performed with a standard box lesion created along the posterior wall of the left atrium 4. Atrial fibrillation successfully cardioverted to sinus rhythm. 5. No early apparent complications.  Brief HPI: Anthony Cole Anthony Cole is a 67 y.o. male with a history of persistent atrial fibrillation.  He has failed medical therapy with Flecainide. Risks, benefits, and alternatives to catheter ablation of atrial fibrillation were reviewed with the patient who wished to proceed.  The patient underwent cardiac CT prior to the procedure which demonstrated no LAA thrombus.  Incidentally noted pulmonary nodules, radiologist recommendation to repeat in 3-6 months, this was discussed with the patient and recommended to f/u with his PMD.   Hospital Course:  The patient was admitted and underwent EPS/RFCA of atrial fibrillation with details as outlined above.  He was monitored on telemetry overnight which demonstrated SR, occ PACs.  R groin was without  complication on the day of discharge.  The patient is feeling well day of discharge, he was examined by Dr. Elberta Fortisamnitz and considered to be stable for discharge.  Wound care and restrictions were reviewed with the patient.  The patient Joselito Fieldhouse be seen back by Rudi Cocoonna Carroll, NP in 4 weeks and Dr Johney FrameAllred in 12 weeks for post ablation follow up.    Physical Exam: Vitals:   08/25/17 2000 08/25/17 2045 08/25/17 2152 08/26/17 0315  BP:  (!) 161/88  (!) 147/84  Pulse:  73 71 73  Resp: 16 16 16 18   Temp:  98 F (36.7 C)  97.6 F (36.4 C)  TempSrc:  Oral  Oral  SpO2:  96% 96% 98%  Weight:    186 lb 1.1 oz (84.4 kg)  Height:        GEN- The patient is well appearing, alert and oriented x 3 today.   HEENT: normocephalic, atraumatic; sclera clear, conjunctiva pink; hearing intact; oropharynx clear; neck supple  Lungs- CTA Cole/l, normal work of breathing.  No wheezes, rales, rhonchi Heart-  RRR, no murmurs, rubs or gallops  GI- soft, non-tender, non-distended  Extremities- no clubbing, cyanosis, or edema; R DP/PT pulses 2+,  R groin without hematoma/bruit MS- no significant deformity or atrophy Skin- warm and dry, no rash or lesion Psych- euthymic mood, full affect Neuro- strength and sensation are intact   Labs:   Lab Results  Component Value Date   WBC 9.1 08/11/2017   HGB 14.7 08/11/2017   HCT 42.5 08/11/2017   MCV 89 08/11/2017   PLT 252 08/11/2017   No results for input(s): NA, K, CL, CO2, BUN, CREATININE, CALCIUM, PROT, BILITOT, ALKPHOS, ALT, AST, GLUCOSE in the last 168 hours.  Invalid  input(s): LABALBU   Discharge Medications:  Allergies as of 08/26/2017      Reactions   Gabapentin Other (See Comments)   Causes Extreme sedation   Other    Not sure of name of med-for overactive bladder-caused some adverse reactions, stopped taking immediately-per patient      Medication List    TAKE these medications   acyclovir 200 MG capsule Commonly known as:  ZOVIRAX Take 200 mg by mouth  3 (three) times daily as needed (first sign of fever blister).   desonide 0.05 % lotion Commonly known as:  DESOWEN Apply 1 application topically daily as needed (dry skin).   doxycycline 100 MG tablet Commonly known as:  VIBRA-TABS Take 100 mg by mouth daily as needed (rosacea).   flecainide 100 MG tablet Commonly known as:  TAMBOCOR Take 1 tablet (100 mg total) by mouth every 12 (twelve) hours.   levothyroxine 88 MCG tablet Commonly known as:  SYNTHROID, LEVOTHROID Take 88 mcg by mouth daily before breakfast.   losartan 50 MG tablet Commonly known as:  COZAAR Take 1 tablet (50 mg total) by mouth daily.   metoprolol succinate 25 MG 24 hr tablet Commonly known as:  TOPROL-XL Take 25 mg by mouth daily.   pantoprazole 40 MG tablet Commonly known as:  PROTONIX Take 1 tablet (40 mg total) by mouth daily.   rosuvastatin 10 MG tablet Commonly known as:  CRESTOR Take 10 mg by mouth daily.   XARELTO 20 MG Tabs tablet Generic drug:  rivaroxaban TAKE 1 TABLET ONCE DAILY. What changed:    how much to take  how to take this  when to take this       Disposition:  Home  Discharge Instructions    Diet - low sodium heart healthy   Complete by:  As directed    Increase activity slowly   Complete by:  As directed      Follow-up Information    [facility] Follow up on 09/22/2017.   Specialty:  Cardiology Why:  11:00AM Contact information: [address] [id number] mc [address] [place] [address] [phone]       Hillis Range, MD Follow up on 11/28/2017.   Specialty:  Cardiology Why:  12:15PM Contact information: [address] [place] [address] [phone]           Duration of Discharge Encounter: Greater than 30 minutes including physician time.  Norma Fredrickson, PA-C 08/26/2017 8:31 AM   I have seen and examined this patient with Francis Dowse.  Agree with above, note added to reflect my  findings.  On exam, RRR, no murmurs, lungs clear.  Patient had AF ablation for atrial fibrillation. Tolerated the procedure well without complaint this morning. Plan for discharge today with follow up in EP clinic.  Emanual Lamountain M. Laasya Peyton MD 08/26/2017 11:00 AM

## 2017-08-25 NOTE — Anesthesia Postprocedure Evaluation (Signed)
Anesthesia Post Note  Patient: Anthony Cole  Procedure(s) Performed: ATRIAL FIBRILLATION ABLATION (N/A )     Patient location during evaluation: Cath Lab Anesthesia Type: General Level of consciousness: awake and alert, oriented and patient cooperative Pain management: pain level controlled Vital Signs Assessment: post-procedure vital signs reviewed and stable Respiratory status: spontaneous breathing, nonlabored ventilation and respiratory function stable Cardiovascular status: blood pressure returned to baseline and stable Postop Assessment: no apparent nausea or vomiting Anesthetic complications: no    Last Vitals:  Vitals Value Taken Time  BP 142/85 08/25/2017 12:30 PM  Temp    Pulse 69 08/25/2017 12:37 PM  Resp 13 08/25/2017 12:37 PM  SpO2 98 % 08/25/2017 12:37 PM  Vitals shown include unvalidated device data.  Last Pain:  Vitals:   08/25/17 1150  TempSrc: Temporal  PainSc:                   Armella Stogner,E. Evonna Stoltz

## 2017-08-25 NOTE — Discharge Instructions (Signed)
Post procedure care instructions °No driving for 4 days. No lifting over 5 lbs for 1 week. No vigorous or sexual activity for 1 week. You may return to work in one week. Keep procedure site clean & dry. If you notice increased pain, swelling, bleeding or pus, call/return!  You may shower, but no soaking baths/hot tubs/pools for 1 week.  ° ° ° ° °You have an appointment set up with the Atrial Fibrillation Clinic.  Multiple studies have shown that being followed by a dedicated atrial fibrillation clinic in addition to the standard care you receive from your other physicians improves health. We believe that enrollment in the atrial fibrillation clinic will allow us to better care for you.  ° °The phone number to the Atrial Fibrillation Clinic is [phone]. The clinic is staffed Monday through Friday from 8:30am to 5pm. ° °Parking Directions: The clinic is located in the Heart and Vascular Building connected to [facility]. °1)From [address] turn on to [address] and go to the 3rd entrance  (Heart and Vascular entrance) on the right. °2)Look to the right for Heart &Vascular Parking Garage. °3)A code for the entrance is required please call the clinic to receive this.   °4)Take the elevators to the 1st floor. Registration is in the room with the glass walls at the end of the hallway. ° °If you have any trouble parking or locating the clinic, please don’t hesitate to call [phone]. °

## 2017-08-25 NOTE — Anesthesia Procedure Notes (Signed)
Procedure Name: LMA Insertion Date/Time: 08/25/2017 8:16 AM Performed by: Izola Priceockfield, Rawley Harju Walton Jr., CRNA Pre-anesthesia Checklist: Patient identified, Emergency Drugs available, Suction available, Patient being monitored and Timeout performed Patient Re-evaluated:Patient Re-evaluated prior to induction Oxygen Delivery Method: Circle system utilized Preoxygenation: Pre-oxygenation with 100% oxygen Induction Type: IV induction Ventilation: Mask ventilation without difficulty LMA: LMA inserted LMA Size: 4.0 Number of attempts: 1 Placement Confirmation: positive ETCO2 and breath sounds checked- equal and bilateral Tube secured with: Tape Dental Injury: Teeth and Oropharynx as per pre-operative assessment

## 2017-08-25 NOTE — Transfer of Care (Signed)
Immediate Anesthesia Transfer of Care Note  Patient: Anthony Cole  Procedure(s) Performed: ATRIAL FIBRILLATION ABLATION (N/A )  Patient Location: Cath Lab  Anesthesia Type:General  Level of Consciousness: awake, alert  and oriented  Airway & Oxygen Therapy: Patient Spontanous Breathing and Patient connected to face mask oxygen  Post-op Assessment: Report given to RN and Post -op Vital signs reviewed and stable  Post vital signs: Reviewed and stable  Last Vitals:  Vitals Value Taken Time  BP 141/80 08/25/2017 10:44 AM  Temp    Pulse 72 08/25/2017 10:45 AM  Resp 16 08/25/2017 10:45 AM  SpO2 100 % 08/25/2017 10:45 AM  Vitals shown include unvalidated device data.  Last Pain:  Vitals:   08/25/17 0639  TempSrc:   PainSc: 0-No pain         Complications: No apparent anesthesia complications

## 2017-08-26 DIAGNOSIS — E039 Hypothyroidism, unspecified: Secondary | ICD-10-CM | POA: Diagnosis not present

## 2017-08-26 DIAGNOSIS — I481 Persistent atrial fibrillation: Secondary | ICD-10-CM

## 2017-08-26 DIAGNOSIS — E119 Type 2 diabetes mellitus without complications: Secondary | ICD-10-CM | POA: Diagnosis not present

## 2017-08-26 DIAGNOSIS — I10 Essential (primary) hypertension: Secondary | ICD-10-CM | POA: Diagnosis not present

## 2017-08-26 LAB — GLUCOSE, CAPILLARY: GLUCOSE-CAPILLARY: 144 mg/dL — AB (ref 65–99)

## 2017-08-26 MED ORDER — PANTOPRAZOLE SODIUM 40 MG PO TBEC: 40.0000 mg | DELAYED_RELEASE_TABLET | Freq: Every day | ORAL | 0 refills | Status: DC

## 2017-08-29 ENCOUNTER — Other Ambulatory Visit (HOSPITAL_COMMUNITY): Payer: Self-pay | Admitting: *Deleted

## 2017-08-29 ENCOUNTER — Telehealth (HOSPITAL_COMMUNITY): Payer: Self-pay | Admitting: *Deleted

## 2017-08-29 MED ORDER — DILTIAZEM HCL 30 MG PO TABS
ORAL_TABLET | ORAL | 1 refills | Status: DC
Start: 2017-08-29 — End: 2020-10-14

## 2017-08-29 NOTE — Telephone Encounter (Signed)
Patient called in stating he has been back in AF since Friday evening after ablation on Thursday. He is rate controlled by his manual count - I have encouraged him to purchase a BP cuff that reads HR as well. He is not symptomatic other than knowing his HR is irregular. Will call in cardizem 30mg  PRN if his HR should become elevated. Appt made for Friday if AF persists - pt verbalized understanding.

## 2017-08-31 ENCOUNTER — Other Ambulatory Visit: Payer: Self-pay | Admitting: Family Medicine

## 2017-08-31 DIAGNOSIS — R9389 Abnormal findings on diagnostic imaging of other specified body structures: Secondary | ICD-10-CM

## 2017-09-02 ENCOUNTER — Ambulatory Visit (HOSPITAL_COMMUNITY)
Admission: RE | Admit: 2017-09-02 | Discharge: 2017-09-02 | Disposition: A | Discharge status: Home / Self Care | Payer: Medicare Other | Source: Ambulatory Visit | Attending: Nurse Practitioner | Admitting: Nurse Practitioner

## 2017-09-02 ENCOUNTER — Encounter (HOSPITAL_COMMUNITY): Payer: Self-pay | Admitting: Nurse Practitioner

## 2017-09-02 VITALS — BP 138/84 | HR 86 | Ht 69.0 in | Wt 186.0 lb

## 2017-09-02 DIAGNOSIS — Z9889 Other specified postprocedural states: Secondary | ICD-10-CM | POA: Diagnosis present

## 2017-09-02 DIAGNOSIS — Z823 Family history of stroke: Secondary | ICD-10-CM | POA: Insufficient documentation

## 2017-09-02 DIAGNOSIS — Z888 Allergy status to other drugs, medicaments and biological substances status: Secondary | ICD-10-CM | POA: Insufficient documentation

## 2017-09-02 DIAGNOSIS — Z8249 Family history of ischemic heart disease and other diseases of the circulatory system: Secondary | ICD-10-CM | POA: Diagnosis not present

## 2017-09-02 DIAGNOSIS — I4819 Other persistent atrial fibrillation: Secondary | ICD-10-CM

## 2017-09-02 DIAGNOSIS — I481 Persistent atrial fibrillation: Secondary | ICD-10-CM | POA: Insufficient documentation

## 2017-09-02 DIAGNOSIS — Z7901 Long term (current) use of anticoagulants: Secondary | ICD-10-CM | POA: Diagnosis not present

## 2017-09-02 DIAGNOSIS — E039 Hypothyroidism, unspecified: Secondary | ICD-10-CM | POA: Diagnosis not present

## 2017-09-02 DIAGNOSIS — E669 Obesity, unspecified: Secondary | ICD-10-CM | POA: Insufficient documentation

## 2017-09-02 DIAGNOSIS — R7303 Prediabetes: Secondary | ICD-10-CM | POA: Insufficient documentation

## 2017-09-02 DIAGNOSIS — G4733 Obstructive sleep apnea (adult) (pediatric): Secondary | ICD-10-CM | POA: Diagnosis not present

## 2017-09-02 DIAGNOSIS — E785 Hyperlipidemia, unspecified: Secondary | ICD-10-CM | POA: Diagnosis not present

## 2017-09-02 DIAGNOSIS — Z79899 Other long term (current) drug therapy: Secondary | ICD-10-CM | POA: Insufficient documentation

## 2017-09-02 DIAGNOSIS — I10 Essential (primary) hypertension: Secondary | ICD-10-CM | POA: Diagnosis not present

## 2017-09-02 LAB — CBC
HEMATOCRIT: 37.9 % — AB (ref 39.0–52.0)
Hemoglobin: 12.8 g/dL — ABNORMAL LOW (ref 13.0–17.0)
MCH: 30.4 pg (ref 26.0–34.0)
MCHC: 33.8 g/dL (ref 30.0–36.0)
MCV: 90 fL (ref 78.0–100.0)
PLATELETS: 309 10*3/uL (ref 150–400)
RBC: 4.21 MIL/uL — AB (ref 4.22–5.81)
RDW: 13.5 % (ref 11.5–15.5)
WBC: 7.6 10*3/uL (ref 4.0–10.5)

## 2017-09-02 LAB — BASIC METABOLIC PANEL
ANION GAP: 8 (ref 5–15)
BUN: 18 mg/dL (ref 6–20)
CO2: 25 mmol/L (ref 22–32)
Calcium: 9.1 mg/dL (ref 8.9–10.3)
Chloride: 104 mmol/L (ref 101–111)
Creatinine, Ser: 0.87 mg/dL (ref 0.61–1.24)
GLUCOSE: 104 mg/dL — AB (ref 65–99)
POTASSIUM: 4.3 mmol/L (ref 3.5–5.1)
Sodium: 137 mmol/L (ref 135–145)

## 2017-09-02 NOTE — Progress Notes (Signed)
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 male with a h/o afib s/p ablation 3/21. He is being seen in the office as pt has noted return of afib since 2 days after ablation. He feels he is tolerating his afib better since ablation. He was instructed to increase the metoprolol and his is rate controlled. He denies any swallowing difficulties and minor bruising at the rt groin site. He has not missed any xarelto doses.  Today, he denies symptoms of palpitations, chest pain, shortness of breath, orthopnea, PND, lower extremity edema, dizziness, presyncope, syncope, or neurologic sequela. The patient is tolerating medications without difficulties and is otherwise without complaint today.   Past Medical History:  Diagnosis Date  . Atrial fibrillation (HCC)   . Hyperlipidemia   . Hypertension   . Hypothyroidism   . Obesity (BMI 30-39.9) 01/25/2016  . OSA on CPAP 07/16/2014   Moderate with AHI 19/hr on CPAP at 8cm H2O  . Pre-diabetes    Past Surgical History:  Procedure Laterality Date  . ATRIAL FIBRILLATION ABLATION N/A 08/25/2017   Procedure: ATRIAL FIBRILLATION ABLATION;  Surgeon: Hillis Range, MD;  Location: [facility] IN[facility]ASIVE [facility];  Service: Cardiovascular;  Laterality: N/A;  . CARDIOVERSION  11/04/2011   Procedure: CARDIOVERSION;  Surgeon: Lesleigh Noe, MD;  Location: [facility] OR;  Service: Cardiovascular;  Laterality: N/A;  . CARDIOVERSION N/A 07/02/2013   Procedure: CARDIOVERSION;  Surgeon: Lesleigh Noe, MD;  Location: [facility] ENDOSCOPY;  Service: Cardiovascular;  Laterality: N/A;  10:07 elective cardioversion Lido 40mg , Propofol 60mg ,IV...200 joules, synched, pt presently in A flutter,...cardioverted to NSR...  . CARDIOVERSION N/A 09/19/2013   Procedure: CARDIOVERSION;  Surgeon: Lesleigh Noe, MD;  Location: [facility] ENDOSCOPY;  Service: Cardiovascular;  Laterality: N/A;  . CARDIOVERSION N/A 08/13/2014   Procedure:  CARDIOVERSION;  Surgeon: Lars Masson, MD;  Location: [facility] ENDOSCOPY;  Service: Cardiovascular;  Laterality: N/A;  . CARDIOVERSION N/A 05/23/2017   Procedure: CARDIOVERSION;  Surgeon: Vesta Mixer, MD;  Location: [facility] ENDOSCOPY;  Service: Cardiovascular;  Laterality: N/A;  . EYE SURGERY    . FINGER FRACTURE SURGERY Left "when I was a kid"  . FRACTURE SURGERY    . KNEE ARTHROSCOPY W/ DEBRIDEMENT Bilateral   . RETINAL LASER PROCEDURE Bilateral    "tears on both; not detachments"  . SHOULDER ARTHROSCOPY W/ ROTATOR CUFF REPAIR Bilateral   . STAPEDES SURGERY Right   . TONSILLECTOMY    . TYMPANOPLASTY Bilateral    "skin grafts to ear drums"    Current Outpatient Medications  Medication Sig Dispense Refill  . acyclovir (ZOVIRAX) 200 MG capsule Take 200 mg by mouth 3 (three) times daily as needed (first sign of fever blister).     [name] desonide (DESOWEN) 0.05 % lotion Apply 1 application topically daily as needed (dry skin).    [name] diltiazem (CARDIZEM) 30 MG tablet Take 1 tablet every 4 hours AS NEEDED for AFIB heart rate >100 as long as top number of blood pressure >100. 45 tablet 1  . doxycycline (VIBRA-TABS) 100 MG tablet Take 100 mg by mouth daily as needed (rosacea).     . flecainide (TAMBOCOR) 100 MG tablet Take 1 tablet (100 mg total) by mouth every 12 (twelve) hours. 180 tablet 2  . levothyroxine (SYNTHROID, LEVOTHROID) 88 MCG tablet Take 88 mcg by mouth daily before breakfast.     . losartan (COZAAR) 50 MG tablet Take 1 tablet (50  mg total) by mouth daily. 30 tablet 11  . metoprolol succinate (TOPROL-XL) 25 MG 24 hr tablet Take 25 mg by mouth daily.    . pantoprazole (PROTONIX) 40 MG tablet Take 1 tablet (40 mg total) by mouth daily. 45 tablet 0  . rosuvastatin (CRESTOR) 10 MG tablet Take 10 mg by mouth daily.     [name]. XARELTO 20 MG TABS tablet TAKE 1 TABLET ONCE DAILY. (Patient taking differently: TAKE 1 TABLET ONCE DAILY IN THE EVENING) 90 tablet 2   Current Facility-Administered  Medications  Medication Dose Route Frequency Provider Last Rate Last Dose  . 0.9 %  sodium chloride infusion  250 mL Intravenous Continuous Lyn RecordsSmith, Henry W, MD      . sodium chloride flush (NS) 0.9 % injection 3 mL  3 mL Intravenous Q12H Lyn RecordsSmith, Henry W, MD      . sodium chloride flush (NS) 0.9 % injection 3 mL  3 mL Intravenous PRN Lyn RecordsSmith, Henry W, MD        Allergies  Allergen Reactions  . Gabapentin Other (See Comments)    Causes Extreme sedation   . Other     Not sure of name of med-for overactive bladder-caused some adverse reactions, stopped taking immediately-per patient    Social History   Socioeconomic History  . Marital status: Single    Spouse name: Not on file  . Number of children: Not on file  . Years of education: Not on file  . Highest education level: Not on file  Occupational History  . Not on file  Social Needs  . Financial resource strain: Not on file  . Food insecurity:    Worry: Not on file    Inability: Not on file  . Transportation needs:    Medical: Not on file    Non-medical: Not on file  Tobacco Use  . Smoking status: Never Smoker  . Smokeless tobacco: Never Used  Substance and Sexual Activity  . Alcohol use: No  . Drug use: No  . Sexual activity: Not Currently  Lifestyle  . Physical activity:    Days per week: Not on file    Minutes per session: Not on file  . Stress: Not on file  Relationships  . Social connections:    Talks on phone: Not on file    Gets together: Not on file    Attends religious service: Not on file    Active member of club or organization: Not on file    Attends meetings of clubs or organizations: Not on file    Relationship status: Not on file  . Intimate partner violence:    Fear of current or ex partner: Not on file    Emotionally abused: Not on file    Physically abused: Not on file    Forced sexual activity: Not on file  Other Topics Concern  . Not on file  Social History Narrative  . Not on file     Family History  Problem Relation Age of Onset  . Hypertension Mother   . Stroke Maternal Grandmother     ROS- All systems are reviewed and negative except as per the HPI above  Physical Exam: Vitals:   09/02/17 1408  BP: 138/84  Pulse: 86  SpO2: 97%  Weight: 186 lb (84.4 kg)  Height: 5\' 9"  (1.753 m)   Wt Readings from Last 3 Encounters:  09/02/17 186 lb (84.4 kg)  08/26/17 186 lb 1.1 oz (84.4 kg)  06/24/17 180 lb 9.6  oz (81.9 kg)    Labs: Lab Results  Component Value Date   NA 140 08/11/2017   K 3.9 08/11/2017   CL 98 08/11/2017   CO2 30 (H) 08/11/2017   GLUCOSE 103 (H) 08/11/2017   BUN 23 08/11/2017   CREATININE 0.93 08/11/2017   CALCIUM 9.9 08/11/2017   Lab Results  Component Value Date   INR 1.4 (H) 05/18/2017   No results found for: CHOL, HDL, LDLCALC, TRIG   GEN- The patient is well appearing, alert and oriented x 3 today.   Head- normocephalic, atraumatic Eyes-  Sclera clear, conjunctiva pink Ears- hearing intact Oropharynx- clear Neck- supple, no JVP Lymph- no cervical lymphadenopathy Lungs- Clear to ausculation bilaterally, normal work of breathing Heart- irregular rate and rhythm, no murmurs, rubs or gallops, PMI not laterally displaced GI- soft, NT, ND, + BS Extremities- no clubbing, cyanosis, or edema MS- no significant deformity or atrophy Skin- no rash or lesion Psych- euthymic mood, full affect Neuro- strength and sensation are intact  EKG-afib at 86 bpm, qrs int 86 ms, qtc 442 ms Epic records reviewed    Assessment and Plan: 1.Persisitent afib s/p ablation 3/21 Will schedule cardioversion, first available is 4/8 Continue metoprolol 25 mg qd  Has 30 mg cardizem  If need for HR over 100 bpm  Continue Flecainide 100 mg bid Continue xarelto  20 mg a daily  No missed doses of xarelto   F/u 10 days in afib clinic after cardioversion  Lupita Leash [facility] [address] [place],  [place] [address] [phone]

## 2017-09-02 NOTE — Patient Instructions (Signed)
Cardioversion scheduled for Friday, April 5th  - Arrive at the [facility] Entrance and go to admitting at 10:30AM  -Do not eat or drink anything after midnight the night prior to your procedure.  - Take all your medication with a sip of water prior to arrival.  - You will not be able to drive home after your procedure.

## 2017-09-02 NOTE — H&P (View-Only) (Signed)
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 male with a h/o afib s/p ablation 3/21. He is being seen in the office as pt has noted return of afib since 2 days after ablation. He feels he is tolerating his afib better since ablation. He was instructed to increase the metoprolol and his is rate controlled. He denies any swallowing difficulties and minor bruising at the rt groin site. He has not missed any xarelto doses.  Today, he denies symptoms of palpitations, chest pain, shortness of breath, orthopnea, PND, lower extremity edema, dizziness, presyncope, syncope, or neurologic sequela. The patient is tolerating medications without difficulties and is otherwise without complaint today.   Past Medical History:  Diagnosis Date  . Atrial fibrillation (HCC)   . Hyperlipidemia   . Hypertension   . Hypothyroidism   . Obesity (BMI 30-39.9) 01/25/2016  . OSA on CPAP 07/16/2014   Moderate with AHI 19/hr on CPAP at 8cm H2O  . Pre-diabetes    Past Surgical History:  Procedure Laterality Date  . ATRIAL FIBRILLATION ABLATION N/A 08/25/2017   Procedure: ATRIAL FIBRILLATION ABLATION;  Surgeon: Hillis Range, MD;  Location: [facility] INVASIVE CV LAB;  Service: Cardiovascular;  Laterality: N/A;  . CARDIOVERSION  11/04/2011   Procedure: CARDIOVERSION;  Surgeon: Lesleigh Noe, MD;  Location: [facility] OR;  Service: Cardiovascular;  Laterality: N/A;  . CARDIOVERSION N/A 07/02/2013   Procedure: CARDIOVERSION;  Surgeon: Lesleigh Noe, MD;  Location: [facility] ENDOSCOPY;  Service: Cardiovascular;  Laterality: N/A;  10:07 elective cardioversion Lido 40mg , Propofol 60mg ,IV...200 joules, synched, pt presently in A flutter,...cardioverted to NSR...  . CARDIOVERSION N/A 09/19/2013   Procedure: CARDIOVERSION;  Surgeon: Lesleigh Noe, MD;  Location: [facility] ENDOSCOPY;  Service: Cardiovascular;  Laterality: N/A;  . CARDIOVERSION N/A 08/13/2014   Procedure:  CARDIOVERSION;  Surgeon: Lars Masson, MD;  Location: [facility] ENDOSCOPY;  Service: Cardiovascular;  Laterality: N/A;  . CARDIOVERSION N/A 05/23/2017   Procedure: CARDIOVERSION;  Surgeon: Vesta Mixer, MD;  Location: [facility] ENDOSCOPY;  Service: Cardiovascular;  Laterality: N/A;  . EYE SURGERY    . FINGER FRACTURE SURGERY Left "when I was a kid"  . FRACTURE SURGERY    . KNEE ARTHROSCOPY W/ DEBRIDEMENT Bilateral   . RETINAL LASER PROCEDURE Bilateral    "tears on both; not detachments"  . SHOULDER ARTHROSCOPY W/ ROTATOR CUFF REPAIR Bilateral   . STAPEDES SURGERY Right   . TONSILLECTOMY    . TYMPANOPLASTY Bilateral    "skin grafts to ear drums"    Current Outpatient Medications  Medication Sig Dispense Refill  . acyclovir (ZOVIRAX) 200 MG capsule Take 200 mg by mouth 3 (three) times daily as needed (first sign of fever blister).     [name] desonide (DESOWEN) 0.05 % lotion Apply 1 application topically daily as needed (dry skin).    [name] diltiazem (CARDIZEM) 30 MG tablet Take 1 tablet every 4 hours AS NEEDED for AFIB heart rate >100 as long as top number of blood pressure >100. 45 tablet 1  . doxycycline (VIBRA-TABS) 100 MG tablet Take 100 mg by mouth daily as needed (rosacea).     . flecainide (TAMBOCOR) 100 MG tablet Take 1 tablet (100 mg total) by mouth every 12 (twelve) hours. 180 tablet 2  . levothyroxine (SYNTHROID, LEVOTHROID) 88 MCG tablet Take 88 mcg by mouth daily before breakfast.     . losartan (COZAAR) 50 MG tablet Take 1 tablet (50  mg total) by mouth daily. 30 tablet 11  . metoprolol succinate (TOPROL-XL) 25 MG 24 hr tablet Take 25 mg by mouth daily.    . pantoprazole (PROTONIX) 40 MG tablet Take 1 tablet (40 mg total) by mouth daily. 45 tablet 0  . rosuvastatin (CRESTOR) 10 MG tablet Take 10 mg by mouth daily.     [name]. XARELTO 20 MG TABS tablet TAKE 1 TABLET ONCE DAILY. (Patient taking differently: TAKE 1 TABLET ONCE DAILY IN THE EVENING) 90 tablet 2   Current Facility-Administered  Medications  Medication Dose Route Frequency Provider Last Rate Last Dose  . 0.9 %  sodium chloride infusion  250 mL Intravenous Continuous Lyn RecordsSmith, Henry W, MD      . sodium chloride flush (NS) 0.9 % injection 3 mL  3 mL Intravenous Q12H Lyn RecordsSmith, Henry W, MD      . sodium chloride flush (NS) 0.9 % injection 3 mL  3 mL Intravenous PRN Lyn RecordsSmith, Henry W, MD        Allergies  Allergen Reactions  . Gabapentin Other (See Comments)    Causes Extreme sedation   . Other     Not sure of name of med-for overactive bladder-caused some adverse reactions, stopped taking immediately-per patient    Social History   Socioeconomic History  . Marital status: Single    Spouse name: Not on file  . Number of children: Not on file  . Years of education: Not on file  . Highest education level: Not on file  Occupational History  . Not on file  Social Needs  . Financial resource strain: Not on file  . Food insecurity:    Worry: Not on file    Inability: Not on file  . Transportation needs:    Medical: Not on file    Non-medical: Not on file  Tobacco Use  . Smoking status: Never Smoker  . Smokeless tobacco: Never Used  Substance and Sexual Activity  . Alcohol use: No  . Drug use: No  . Sexual activity: Not Currently  Lifestyle  . Physical activity:    Days per week: Not on file    Minutes per session: Not on file  . Stress: Not on file  Relationships  . Social connections:    Talks on phone: Not on file    Gets together: Not on file    Attends religious service: Not on file    Active member of club or organization: Not on file    Attends meetings of clubs or organizations: Not on file    Relationship status: Not on file  . Intimate partner violence:    Fear of current or ex partner: Not on file    Emotionally abused: Not on file    Physically abused: Not on file    Forced sexual activity: Not on file  Other Topics Concern  . Not on file  Social History Narrative  . Not on file     Family History  Problem Relation Age of Onset  . Hypertension Mother   . Stroke Maternal Grandmother     ROS- All systems are reviewed and negative except as per the HPI above  Physical Exam: Vitals:   09/02/17 1408  BP: 138/84  Pulse: 86  SpO2: 97%  Weight: 186 lb (84.4 kg)  Height: 5\' 9"  (1.753 m)   Wt Readings from Last 3 Encounters:  09/02/17 186 lb (84.4 kg)  08/26/17 186 lb 1.1 oz (84.4 kg)  06/24/17 180 lb 9.6  oz (81.9 kg)    Labs: Lab Results  Component Value Date   NA 140 08/11/2017   K 3.9 08/11/2017   CL 98 08/11/2017   CO2 30 (H) 08/11/2017   GLUCOSE 103 (H) 08/11/2017   BUN 23 08/11/2017   CREATININE 0.93 08/11/2017   CALCIUM 9.9 08/11/2017   Lab Results  Component Value Date   INR 1.4 (H) 05/18/2017   No results found for: CHOL, HDL, LDLCALC, TRIG   GEN- The patient is well appearing, alert and oriented x 3 today.   Head- normocephalic, atraumatic Eyes-  Sclera clear, conjunctiva pink Ears- hearing intact Oropharynx- clear Neck- supple, no JVP Lymph- no cervical lymphadenopathy Lungs- Clear to ausculation bilaterally, normal work of breathing Heart- irregular rate and rhythm, no murmurs, rubs or gallops, PMI not laterally displaced GI- soft, NT, ND, + BS Extremities- no clubbing, cyanosis, or edema MS- no significant deformity or atrophy Skin- no rash or lesion Psych- euthymic mood, full affect Neuro- strength and sensation are intact  EKG-afib at 86 bpm, qrs int 86 ms, qtc 442 ms Epic records reviewed    Assessment and Plan: 1.Persisitent afib s/p ablation 3/21 Will schedule cardioversion, first available is 4/8 Continue metoprolol 25 mg qd  Has 30 mg cardizem  If need for HR over 100 bpm  Continue Flecainide 100 mg bid Continue xarelto  20 mg a daily  No missed doses of xarelto   F/u 10 days in afib clinic after cardioversion  Lupita Leash [facility] [address] [place],  [place] [address] [phone]

## 2017-09-05 ENCOUNTER — Ambulatory Visit (HOSPITAL_COMMUNITY)
Admission: RE | Admit: 2017-09-05 | Discharge: 2017-09-05 | Disposition: A | Discharge status: Home / Self Care | Payer: Medicare Other | Source: Ambulatory Visit | Attending: Nurse Practitioner | Admitting: Nurse Practitioner

## 2017-09-05 DIAGNOSIS — S7011XA Contusion of right thigh, initial encounter: Secondary | ICD-10-CM | POA: Diagnosis not present

## 2017-09-05 DIAGNOSIS — Y92238 Other place in hospital as the place of occurrence of the external cause: Secondary | ICD-10-CM | POA: Diagnosis not present

## 2017-09-05 DIAGNOSIS — X58XXXA Exposure to other specified factors, initial encounter: Secondary | ICD-10-CM | POA: Insufficient documentation

## 2017-09-05 NOTE — Progress Notes (Signed)
Pt in for someone to look at his posterior rt thigh bruising s/p ablation. The   bruising appears several days old and is clearing. It is not painful. Pt reassured.

## 2017-09-09 ENCOUNTER — Ambulatory Visit (HOSPITAL_COMMUNITY): Payer: Medicare Other | Admitting: Certified Registered"

## 2017-09-09 ENCOUNTER — Other Ambulatory Visit: Payer: Self-pay

## 2017-09-09 ENCOUNTER — Ambulatory Visit (HOSPITAL_COMMUNITY)
Admission: RE | Admit: 2017-09-09 | Discharge: 2017-09-09 | Disposition: A | Discharge status: Home / Self Care | Payer: Medicare Other | Source: Ambulatory Visit | Attending: Internal Medicine | Admitting: Internal Medicine

## 2017-09-09 ENCOUNTER — Encounter (HOSPITAL_COMMUNITY)
Admission: RE | Disposition: A | Discharge status: Home / Self Care | Payer: Self-pay | Source: Ambulatory Visit | Attending: Internal Medicine

## 2017-09-09 ENCOUNTER — Encounter (HOSPITAL_COMMUNITY): Payer: Self-pay

## 2017-09-09 DIAGNOSIS — E119 Type 2 diabetes mellitus without complications: Secondary | ICD-10-CM | POA: Diagnosis not present

## 2017-09-09 DIAGNOSIS — I10 Essential (primary) hypertension: Secondary | ICD-10-CM | POA: Insufficient documentation

## 2017-09-09 DIAGNOSIS — I481 Persistent atrial fibrillation: Secondary | ICD-10-CM | POA: Insufficient documentation

## 2017-09-09 DIAGNOSIS — Z888 Allergy status to other drugs, medicaments and biological substances status: Secondary | ICD-10-CM | POA: Insufficient documentation

## 2017-09-09 DIAGNOSIS — Z79899 Other long term (current) drug therapy: Secondary | ICD-10-CM | POA: Diagnosis not present

## 2017-09-09 DIAGNOSIS — E785 Hyperlipidemia, unspecified: Secondary | ICD-10-CM | POA: Diagnosis not present

## 2017-09-09 DIAGNOSIS — Z7901 Long term (current) use of anticoagulants: Secondary | ICD-10-CM | POA: Diagnosis not present

## 2017-09-09 DIAGNOSIS — G4733 Obstructive sleep apnea (adult) (pediatric): Secondary | ICD-10-CM | POA: Diagnosis not present

## 2017-09-09 DIAGNOSIS — E039 Hypothyroidism, unspecified: Secondary | ICD-10-CM | POA: Insufficient documentation

## 2017-09-09 DIAGNOSIS — Z9989 Dependence on other enabling machines and devices: Secondary | ICD-10-CM | POA: Diagnosis not present

## 2017-09-09 DIAGNOSIS — I4891 Unspecified atrial fibrillation: Secondary | ICD-10-CM | POA: Diagnosis not present

## 2017-09-09 HISTORY — PX: CARDIOVERSION: SHX1299

## 2017-09-09 SURGERY — CARDIOVERSION
Anesthesia: General

## 2017-09-09 MED ORDER — PROPOFOL 10 MG/ML IV BOLUS
INTRAVENOUS | Status: DC | PRN
  Administered 2017-09-09: 50 mg via INTRAVENOUS

## 2017-09-09 MED ORDER — SODIUM CHLORIDE 0.9 % IV SOLN
INTRAVENOUS | Status: DC
  Administered 2017-09-09: 11:00:00 via INTRAVENOUS

## 2017-09-09 MED ORDER — LIDOCAINE 2% (20 MG/ML) 5 ML SYRINGE
INTRAMUSCULAR | Status: DC | PRN
Start: 2017-09-09 — End: 2017-09-09
  Administered 2017-09-09: 100 mg via INTRAVENOUS

## 2017-09-09 NOTE — Transfer of Care (Signed)
Immediate Anesthesia Transfer of Care Note  Patient: Anthony SabinaRoderick B Cole  Procedure(s) Performed: CARDIOVERSION (N/A )  Patient Location: Endoscopy Unit  Anesthesia Type:General  Level of Consciousness: awake  Airway & Oxygen Therapy: Patient Spontanous Breathing and Patient connected to face mask oxygen  Post-op Assessment: Report given to RN and Post -op Vital signs reviewed and stable  Post vital signs: Reviewed and stable  Last Vitals:  Vitals Value Taken Time  BP    Temp    Pulse    Resp    SpO2      Last Pain:  Vitals:   09/09/17 1046  TempSrc: Oral  PainSc: 0-No pain         Complications: No apparent anesthesia complications

## 2017-09-09 NOTE — Interval H&P Note (Signed)
History and Physical Interval Note:  09/09/2017 10:41 AM  Anthony Cole  has presented today for surgery, with the diagnosis of AFIB  The various methods of treatment have been discussed with the patient and family. After consideration of risks, benefits and other options for treatment, the patient has consented to  Procedure(s): CARDIOVERSION (N/A) as a surgical intervention .  The patient's history has been reviewed, patient examined, no change in status, stable for surgery.  I have reviewed the patient's chart and labs.  Questions were answered to the patient's satisfaction.     Dietrich PatesPaula Zakari Couchman

## 2017-09-09 NOTE — Op Note (Signed)
Cardioversion  Patient anesthetized by anesthesia with propofol and lidocaine  With pads in AP postion, patient cardioverted to SR with 200 J synchronized biphasic energy  Procedure was without complication 12 lead EKG pending

## 2017-09-09 NOTE — Anesthesia Preprocedure Evaluation (Signed)
Anesthesia Evaluation  Patient identified by MRN, date of birth, ID band Patient awake    Reviewed: Allergy & Precautions, NPO status , Patient's Chart, lab work & pertinent test results  Airway Mallampati: II   Neck ROM: Full    Dental no notable dental hx.    Pulmonary sleep apnea ,    breath sounds clear to auscultation       Cardiovascular hypertension, + dysrhythmias  Rhythm:Irregular Rate:Normal     Neuro/Psych    GI/Hepatic negative GI ROS, Neg liver ROS,   Endo/Other  diabetesHypothyroidism   Renal/GU      Musculoskeletal   Abdominal   Peds  Hematology   Anesthesia Other Findings   Reproductive/Obstetrics                             Anesthesia Physical Anesthesia Plan  ASA: III  Anesthesia Plan: General   Post-op Pain Management:    Induction: Intravenous  PONV Risk Score and Plan: 2 and Treatment may vary due to age or medical condition  Airway Management Planned: Mask  Additional Equipment:   Intra-op Plan:   Post-operative Plan:   Informed Consent: I have reviewed the patients History and Physical, chart, labs and discussed the procedure including the risks, benefits and alternatives for the proposed anesthesia with the patient or authorized representative who has indicated his/her understanding and acceptance.     Plan Discussed with: CRNA  Anesthesia Plan Comments:         Anesthesia Quick Evaluation  

## 2017-09-09 NOTE — Anesthesia Postprocedure Evaluation (Signed)
Anesthesia Post Note  Patient: Anthony Cole  Procedure(s) Performed: CARDIOVERSION (N/A )     Patient location during evaluation: PACU Anesthesia Type: General Level of consciousness: awake and alert Pain management: pain level controlled Vital Signs Assessment: post-procedure vital signs reviewed and stable Respiratory status: spontaneous breathing, nonlabored ventilation, respiratory function stable and patient connected to nasal cannula oxygen Cardiovascular status: blood pressure returned to baseline and stable Postop Assessment: no apparent nausea or vomiting Anesthetic complications: no    Last Vitals:  Vitals:   09/09/17 1119 09/09/17 1128  BP: (!) 158/87 (!) 146/85  Pulse: (!) 55 (!) 51  Resp: 11 17  Temp: 36.9 C   SpO2: 98% 99%    Last Pain:  Vitals:   09/09/17 1128  TempSrc:   PainSc: 0-No pain                 Edla Para EDWARD

## 2017-09-09 NOTE — Anesthesia Procedure Notes (Signed)
Procedure Name: General with mask airway Date/Time: 09/09/2017 11:10 AM Performed by: Elliot DallyHuggins, Doyce Stonehouse, CRNA Pre-anesthesia Checklist: Patient identified, Emergency Drugs available, Suction available, Patient being monitored and Timeout performed Patient Re-evaluated:Patient Re-evaluated prior to induction Oxygen Delivery Method: Ambu bag Preoxygenation: Pre-oxygenation with 100% oxygen Induction Type: IV induction

## 2017-09-11 ENCOUNTER — Encounter (HOSPITAL_COMMUNITY): Payer: Self-pay | Admitting: Internal Medicine

## 2017-09-13 ENCOUNTER — Ambulatory Visit (INDEPENDENT_AMBULATORY_CARE_PROVIDER_SITE_OTHER): Payer: Medicare Other | Admitting: Ophthalmology

## 2017-09-13 DIAGNOSIS — I10 Essential (primary) hypertension: Secondary | ICD-10-CM | POA: Diagnosis not present

## 2017-09-13 DIAGNOSIS — H35033 Hypertensive retinopathy, bilateral: Secondary | ICD-10-CM

## 2017-09-13 DIAGNOSIS — H35372 Puckering of macula, left eye: Secondary | ICD-10-CM | POA: Diagnosis not present

## 2017-09-13 DIAGNOSIS — H33303 Unspecified retinal break, bilateral: Secondary | ICD-10-CM | POA: Diagnosis not present

## 2017-09-13 DIAGNOSIS — H43813 Vitreous degeneration, bilateral: Secondary | ICD-10-CM

## 2017-09-13 DIAGNOSIS — H353132 Nonexudative age-related macular degeneration, bilateral, intermediate dry stage: Secondary | ICD-10-CM

## 2017-09-20 ENCOUNTER — Ambulatory Visit (INDEPENDENT_AMBULATORY_CARE_PROVIDER_SITE_OTHER): Payer: BC Managed Care – PPO | Admitting: Ophthalmology

## 2017-09-22 ENCOUNTER — Ambulatory Visit (HOSPITAL_COMMUNITY)
Admission: RE | Admit: 2017-09-22 | Discharge: 2017-09-22 | Disposition: A | Discharge status: Home / Self Care | Payer: Medicare Other | Source: Ambulatory Visit | Attending: Nurse Practitioner | Admitting: Nurse Practitioner

## 2017-09-22 ENCOUNTER — Encounter (HOSPITAL_COMMUNITY): Payer: Self-pay | Admitting: Nurse Practitioner

## 2017-09-22 VITALS — BP 148/86 | HR 86 | Ht 69.0 in | Wt 182.0 lb

## 2017-09-22 DIAGNOSIS — I481 Persistent atrial fibrillation: Secondary | ICD-10-CM | POA: Diagnosis not present

## 2017-09-22 DIAGNOSIS — I4891 Unspecified atrial fibrillation: Secondary | ICD-10-CM | POA: Diagnosis not present

## 2017-09-22 DIAGNOSIS — I4819 Other persistent atrial fibrillation: Secondary | ICD-10-CM

## 2017-09-22 LAB — CBC
HEMATOCRIT: 41.8 % (ref 39.0–52.0)
HEMOGLOBIN: 14.4 g/dL (ref 13.0–17.0)
MCH: 31.4 pg (ref 26.0–34.0)
MCHC: 34.4 g/dL (ref 30.0–36.0)
MCV: 91.3 fL (ref 78.0–100.0)
Platelets: 255 10*3/uL (ref 150–400)
RBC: 4.58 MIL/uL (ref 4.22–5.81)
RDW: 13.7 % (ref 11.5–15.5)
WBC: 7.7 10*3/uL (ref 4.0–10.5)

## 2017-09-22 LAB — BASIC METABOLIC PANEL
Anion gap: 8 (ref 5–15)
BUN: 15 mg/dL (ref 6–20)
CALCIUM: 9.2 mg/dL (ref 8.9–10.3)
CHLORIDE: 103 mmol/L (ref 101–111)
CO2: 28 mmol/L (ref 22–32)
Creatinine, Ser: 0.86 mg/dL (ref 0.61–1.24)
GFR calc non Af Amer: >60 mL/min (ref >60)
GLUCOSE: 121 mg/dL — AB (ref 65–99)
Potassium: 4 mmol/L (ref 3.5–5.1)
Sodium: 139 mmol/L (ref 135–145)

## 2017-09-22 LAB — TSH: TSH: 4.183 u[IU]/mL (ref 0.350–4.500)

## 2017-09-22 NOTE — H&P (View-Only) (Signed)
Primary Care Physician: Ross, Charles Alan, MD Referring Physician: Dr. Wyatt PortelaAllred   Anthony Cole is a 67 y.o. male with a h/o afib s/p ablation 08/25/17. He was being seen in the office as pt has noted return of afib since 2 days after ablation. He felt  he was tolerating his afib better since ablation. He was instructed to increase Daisy Florothe metoprolol and his was rate controlled. He denied any swallowing difficulties and had minor bruising at the rt groin site. He was set up for successful cardioversion.   He returns to afib clinic, unfortunately has returned to afib. He believes he held in normal rhythm for about a week. No known trigger. Continues to take flecainde 100 mg bid, and xarelto, no missed doses.   Today, he denies symptoms of palpitations, chest pain, shortness of breath, orthopnea, PND, lower extremity edema, dizziness, presyncope, syncope, or neurologic sequela. The patient is tolerating medications without difficulties and is otherwise without complaint today.   Past Medical History:  Diagnosis Date  . Atrial fibrillation (HCC)   . Hyperlipidemia   . Hypertension   . Hypothyroidism   . Obesity (BMI 30-39.9) 01/25/2016  . OSA on CPAP 07/16/2014   Moderate with AHI 19/hr on CPAP at 8cm H2O  . Pre-diabetes    Past Surgical History:  Procedure Laterality Date  . ATRIAL FIBRILLATION ABLATION N/A 08/25/2017   Procedure: ATRIAL FIBRILLATION ABLATION;  Surgeon: Hillis RangeAllred, James, MD;  [facility]: [facility];  Service: Cardiovascular;  Laterality: N/A;  . CARDIOVERSION  11/04/2011   Procedure: CARDIOVERSION;  Surgeon: Lesleigh NoeHenry W Smith III, MD;  [facility];  Service: Cardiovascular;  Laterality: N/A;  . CARDIOVERSION N/A 07/02/2013   Procedure: CARDIOVERSION;  Surgeon: Lesleigh NoeHenry W Smith III, MD;  [facility];  Service: Cardiovascular;  Laterality: N/A;  10:07 elective cardioversion Lido 40mg , Propofol 60mg ,IV...200 joules, synched, pt presently in A flutter,...cardioverted to  NSR...  . CARDIOVERSION N/A 09/19/2013   Procedure: CARDIOVERSION;  Surgeon: Lesleigh NoeHenry W Smith III, MD;  [facility];  Service: Cardiovascular;  Laterality: N/A;  . CARDIOVERSION N/A 08/13/2014   Procedure: CARDIOVERSION;  Surgeon: Lars MassonKatarina H Nelson, MD;  [facility];  Service: Cardiovascular;  Laterality: N/A;  . CARDIOVERSION N/A 05/23/2017   Procedure: CARDIOVERSION;  Surgeon: Vesta MixerNahser, Philip J, MD;  [facility];  Service: Cardiovascular;  Laterality: N/A;  . CARDIOVERSION N/A 09/09/2017   Procedure: CARDIOVERSION;  Surgeon: Pricilla Riffleoss, Paula V, MD;  [facility];  Service: Cardiovascular;  Laterality: N/A;  . EYE SURGERY    . FINGER FRACTURE SURGERY Left "when I was a kid"  . FRACTURE SURGERY    . KNEE ARTHROSCOPY W/ DEBRIDEMENT Bilateral   . RETINAL LASER PROCEDURE Bilateral    "tears on both; not detachments"  . SHOULDER ARTHROSCOPY W/ ROTATOR CUFF REPAIR Bilateral   . STAPEDES SURGERY Right   . TONSILLECTOMY    . TYMPANOPLASTY Bilateral    "skin grafts to ear drums"    Current Outpatient Medications  Medication Sig Dispense Refill  . acyclovir (ZOVIRAX) 200 MG capsule Take 200 mg by mouth 3 (three) times daily as needed (first sign of fever blister).     [name]. desonide (DESOWEN) 0.05 % lotion Apply 1 application topically daily as needed (dry skin).    [name]. diltiazem (CARDIZEM) 30 MG tablet Take 1 tablet every 4 hours AS NEEDED for AFIB heart rate >100 as long as top number of blood pressure >100. 45 tablet 1  . doxycycline (VIBRA-TABS) 100  MG tablet Take 100 mg by mouth daily as needed (rosacea).     . flecainide (TAMBOCOR) 100 MG tablet Take 1 tablet (100 mg total) by mouth every 12 (twelve) hours. 180 tablet 2  . levothyroxine (SYNTHROID, LEVOTHROID) 88 MCG tablet Take 88 mcg by mouth daily before breakfast.     . losartan (COZAAR) 50 MG tablet Take 1 tablet (50 mg total) by mouth daily. 30 tablet 11  . metoprolol succinate (TOPROL-XL) 25 MG 24 hr tablet  Take 25 mg by mouth daily.    . Multiple Vitamins-Minerals (ICAPS AREDS 2 PO) Take by mouth.    . pantoprazole (PROTONIX) 40 MG tablet Take 1 tablet (40 mg total) by mouth daily. 45 tablet 0  . rosuvastatin (CRESTOR) 10 MG tablet Take 10 mg by mouth daily.     [name] 20 MG TABS tablet TAKE 1 TABLET ONCE DAILY. (Patient taking differently: TAKE 1 TABLET ONCE DAILY IN THE EVENING) 90 tablet 2   Current Facility-Administered Medications  Medication Dose Route Frequency Provider Last Rate Last Dose  . 0.9 %  sodium chloride infusion  250 mL Intravenous Continuous [name], MD      . sodium chloride flush (NS) 0.9 % injection 3 mL  3 mL Intravenous Q12H [name], MD      . sodium chloride flush (NS) 0.9 % injection 3 mL  3 mL Intravenous PRN [name], MD        Allergies  Allergen Reactions  . Gabapentin Other (See Comments)    Causes Extreme sedation   . Other     Not sure of name of med-for overactive bladder-caused some adverse reactions, stopped taking immediately-per patient    Social History   Socioeconomic History  . Marital status: Single    Spouse name: Not on file  . Number of children: Not on file  . Years of education: Not on file  . Highest education level: Not on file  Occupational History  . Not on file  Social Needs  . Financial resource strain: Not on file  . Food insecurity:    Worry: Not on file    Inability: Not on file  . Transportation needs:    Medical: Not on file    Non-medical: Not on file  Tobacco Use  . Smoking status: Never Smoker  . Smokeless tobacco: Never Used  Substance and Sexual Activity  . Alcohol use: No  . Drug use: No  . Sexual activity: Not Currently  Lifestyle  . Physical activity:    Days per week: Not on file    Minutes per session: Not on file  . Stress: Not on file  Relationships  . Social connections:    Talks on phone: Not on file    Gets together: Not on file    Attends religious service: Not on  file    Active member of club or organization: Not on file    Attends meetings of clubs or organizations: Not on file    Relationship status: Not on file  . Intimate partner violence:    Fear of current or ex partner: Not on file    Emotionally abused: Not on file    Physically abused: Not on file    Forced sexual activity: Not on file  Other Topics Concern  . Not on file  Social History Narrative  . Not on file    Family History  Problem Relation Age of Onset  . Hypertension Mother   .  Stroke Maternal Grandmother     ROS- All systems are reviewed and negative except as per the HPI above  Physical Exam: Vitals:   09/22/17 1100  BP: (!) 148/86  Pulse: 86  Weight: 182 lb (82.6 kg)  Height: 5\' 9"  (1.753 m)   Wt Readings from Last 3 Encounters:  09/22/17 182 lb (82.6 kg)  09/09/17 186 lb (84.4 kg)  09/02/17 186 lb (84.4 kg)    Labs: Lab Results  Component Value Date   NA 137 09/02/2017   K 4.3 09/02/2017   CL 104 09/02/2017   CO2 25 09/02/2017   GLUCOSE 104 (H) 09/02/2017   BUN 18 09/02/2017   CREATININE 0.87 09/02/2017   CALCIUM 9.1 09/02/2017   Lab Results  Component Value Date   INR 1.4 (H) 05/18/2017   No results found for: CHOL, HDL, LDLCALC, TRIG   GEN- The patient is well appearing, alert and oriented x 3 today.   Head- normocephalic, atraumatic Eyes-  Sclera clear, conjunctiva pink Ears- hearing intact Oropharynx- clear Neck- supple, no JVP Lymph- no cervical lymphadenopathy Lungs- Clear to ausculation bilaterally, normal work of breathing Heart- irregular rate and rhythm, no murmurs, rubs or gallops, PMI not laterally displaced GI- soft, NT, ND, + BS Extremities- no clubbing, cyanosis, or edema MS- no significant deformity or atrophy Skin- no rash or lesion Psych- euthymic mood, full affect Neuro- strength and sensation are intact  EKG-afib at 86 bpm, qrs int 86 ms, qtc 435 ms Epic records reviewed    Assessment and  Plan: 1.Persisitent afib s/p ablation 3/21 Will schedule cardioversion, hopefully as he has had more time to heal from the ablation, this cardioversion will hold in SR longer  Continue metoprolol 25 mg qd  Continue Flecainide 100 mg bid Continue xarelto  20 mg a daily  No missed doses of xarelto IF this cardioversion is not successful may have to change antiarrythmic, has been on amiodarone in the past as well Tsh, cbc, bmet today   F/u 2 weeks in afib clinic after cardioversion  Lupita Leash C. [facility] [address] [place], [place] [address] [phone]

## 2017-09-22 NOTE — Progress Notes (Signed)
error 

## 2017-09-22 NOTE — Progress Notes (Signed)
Primary Care Physician: Ross, Charles Alan, MD Referring Physician: Dr. Wyatt PortelaAllred   Anthony Cole is a 67 y.o. male with a h/o afib s/p ablation 08/25/17. He was being seen in the office as pt has noted return of afib since 2 days after ablation. He felt  he was tolerating his afib better since ablation. He was instructed to increase Daisy Florothe metoprolol and his was rate controlled. He denied any swallowing difficulties and had minor bruising at the rt groin site. He was set up for successful cardioversion.   He returns to afib clinic, unfortunately has returned to afib. He believes he held in normal rhythm for about a week. No known trigger. Continues to take flecainde 100 mg bid, and xarelto, no missed doses.   Today, he denies symptoms of palpitations, chest pain, shortness of breath, orthopnea, PND, lower extremity edema, dizziness, presyncope, syncope, or neurologic sequela. The patient is tolerating medications without difficulties and is otherwise without complaint today.   Past Medical History:  Diagnosis Date  . Atrial fibrillation (HCC)   . Hyperlipidemia   . Hypertension   . Hypothyroidism   . Obesity (BMI 30-39.9) 01/25/2016  . OSA on CPAP 07/16/2014   Moderate with AHI 19/hr on CPAP at 8cm H2O  . Pre-diabetes    Past Surgical History:  Procedure Laterality Date  . ATRIAL FIBRILLATION ABLATION N/A 08/25/2017   Procedure: ATRIAL FIBRILLATION ABLATION;  Surgeon: Hillis RangeAllred, James, MD;  Location: [facility];  Service: Cardiovascular;  Laterality: N/A;  . CARDIOVERSION  11/04/2011   Procedure: CARDIOVERSION;  Surgeon: Lesleigh NoeHenry W Smith III, MD;  [facility];  Service: Cardiovascular;  Laterality: N/A;  . CARDIOVERSION N/A 07/02/2013   Procedure: CARDIOVERSION;  Surgeon: Lesleigh NoeHenry W Smith III, MD;  [facility];  Service: Cardiovascular;  Laterality: N/A;  10:07 elective cardioversion Lido 40mg , Propofol 60mg ,IV...200 joules, synched, pt presently in A flutter,...cardioverted to  NSR...  . CARDIOVERSION N/A 09/19/2013   Procedure: CARDIOVERSION;  Surgeon: Lesleigh NoeHenry W Smith III, MD;  [facility];  Service: Cardiovascular;  Laterality: N/A;  . CARDIOVERSION N/A 08/13/2014   Procedure: CARDIOVERSION;  Surgeon: Lars MassonKatarina H Nelson, MD;  [facility];  Service: Cardiovascular;  Laterality: N/A;  . CARDIOVERSION N/A 05/23/2017   Procedure: CARDIOVERSION;  Surgeon: Vesta MixerNahser, Philip J, MD;  [facility];  Service: Cardiovascular;  Laterality: N/A;  . CARDIOVERSION N/A 09/09/2017   Procedure: CARDIOVERSION;  Surgeon: Pricilla Riffleoss, Paula V, MD;  [facility];  Service: Cardiovascular;  Laterality: N/A;  . EYE SURGERY    . FINGER FRACTURE SURGERY Left "when I was a kid"  . FRACTURE SURGERY    . KNEE ARTHROSCOPY W/ DEBRIDEMENT Bilateral   . RETINAL LASER PROCEDURE Bilateral    "tears on both; not detachments"  . SHOULDER ARTHROSCOPY W/ ROTATOR CUFF REPAIR Bilateral   . STAPEDES SURGERY Right   . TONSILLECTOMY    . TYMPANOPLASTY Bilateral    "skin grafts to ear drums"    Current Outpatient Medications  Medication Sig Dispense Refill  . acyclovir (ZOVIRAX) 200 MG capsule Take 200 mg by mouth 3 (three) times daily as needed (first sign of fever blister).     [name]. desonide (DESOWEN) 0.05 % lotion Apply 1 application topically daily as needed (dry skin).    [name]. diltiazem (CARDIZEM) 30 MG tablet Take 1 tablet every 4 hours AS NEEDED for AFIB heart rate >100 as long as top number of blood pressure >100. 45 tablet 1  . doxycycline (VIBRA-TABS) 100  MG tablet Take 100 mg by mouth daily as needed (rosacea).     . flecainide (TAMBOCOR) 100 MG tablet Take 1 tablet (100 mg total) by mouth every 12 (twelve) hours. 180 tablet 2  . levothyroxine (SYNTHROID, LEVOTHROID) 88 MCG tablet Take 88 mcg by mouth daily before breakfast.     . losartan (COZAAR) 50 MG tablet Take 1 tablet (50 mg total) by mouth daily. 30 tablet 11  . metoprolol succinate (TOPROL-XL) 25 MG 24 hr tablet  Take 25 mg by mouth daily.    . Multiple Vitamins-Minerals (ICAPS AREDS 2 PO) Take by mouth.    . pantoprazole (PROTONIX) 40 MG tablet Take 1 tablet (40 mg total) by mouth daily. 45 tablet 0  . rosuvastatin (CRESTOR) 10 MG tablet Take 10 mg by mouth daily.     Anthony Cole 20 MG TABS tablet TAKE 1 TABLET ONCE DAILY. (Patient taking differently: TAKE 1 TABLET ONCE DAILY IN THE EVENING) 90 tablet 2   Current Facility-Administered Medications  Medication Dose Route Frequency Provider Last Rate Last Dose  . 0.9 %  sodium chloride infusion  250 mL Intravenous Continuous [name], MD      . sodium chloride flush (NS) 0.9 % injection 3 mL  3 mL Intravenous Q12H [name], MD      . sodium chloride flush (NS) 0.9 % injection 3 mL  3 mL Intravenous PRN [name], MD        Allergies  Allergen Reactions  . Gabapentin Other (See Comments)    Causes Extreme sedation   . Other     Not sure of name of med-for overactive bladder-caused some adverse reactions, stopped taking immediately-per patient    Social History   Socioeconomic History  . Marital status: Single    Spouse name: Not on file  . Number of children: Not on file  . Years of education: Not on file  . Highest education level: Not on file  Occupational History  . Not on file  Social Needs  . Financial resource strain: Not on file  . Food insecurity:    Worry: Not on file    Inability: Not on file  . Transportation needs:    Medical: Not on file    Non-medical: Not on file  Tobacco Use  . Smoking status: Never Smoker  . Smokeless tobacco: Never Used  Substance and Sexual Activity  . Alcohol use: No  . Drug use: No  . Sexual activity: Not Currently  Lifestyle  . Physical activity:    Days per week: Not on file    Minutes per session: Not on file  . Stress: Not on file  Relationships  . Social connections:    Talks on phone: Not on file    Gets together: Not on file    Attends religious service: Not on  file    Active member of club or organization: Not on file    Attends meetings of clubs or organizations: Not on file    Relationship status: Not on file  . Intimate partner violence:    Fear of current or ex partner: Not on file    Emotionally abused: Not on file    Physically abused: Not on file    Forced sexual activity: Not on file  Other Topics Concern  . Not on file  Social History Narrative  . Not on file    Family History  Problem Relation Age of Onset  . Hypertension Mother   .  Stroke Maternal Grandmother     ROS- All systems are reviewed and negative except as per the HPI above  Physical Exam: Vitals:   09/22/17 1100  BP: (!) 148/86  Pulse: 86  Weight: 182 lb (82.6 kg)  Height: 5\' 9"  (1.753 m)   Wt Readings from Last 3 Encounters:  09/22/17 182 lb (82.6 kg)  09/09/17 186 lb (84.4 kg)  09/02/17 186 lb (84.4 kg)    Labs: Lab Results  Component Value Date   NA 137 09/02/2017   K 4.3 09/02/2017   CL 104 09/02/2017   CO2 25 09/02/2017   GLUCOSE 104 (H) 09/02/2017   BUN 18 09/02/2017   CREATININE 0.87 09/02/2017   CALCIUM 9.1 09/02/2017   Lab Results  Component Value Date   INR 1.4 (H) 05/18/2017   No results found for: CHOL, HDL, LDLCALC, TRIG   GEN- The patient is well appearing, alert and oriented x 3 today.   Head- normocephalic, atraumatic Eyes-  Sclera clear, conjunctiva pink Ears- hearing intact Oropharynx- clear Neck- supple, no JVP Lymph- no cervical lymphadenopathy Lungs- Clear to ausculation bilaterally, normal work of breathing Heart- irregular rate and rhythm, no murmurs, rubs or gallops, PMI not laterally displaced GI- soft, NT, ND, + BS Extremities- no clubbing, cyanosis, or edema MS- no significant deformity or atrophy Skin- no rash or lesion Psych- euthymic mood, full affect Neuro- strength and sensation are intact  EKG-afib at 86 bpm, qrs int 86 ms, qtc 435 ms Epic records reviewed    Assessment and  Plan: 1.Persisitent afib s/p ablation 3/21 Will schedule cardioversion, hopefully as he has had more time to heal from the ablation, this cardioversion will hold in SR longer  Continue metoprolol 25 mg qd  Continue Flecainide 100 mg bid Continue xarelto  20 mg a daily  No missed doses of xarelto IF this cardioversion is not successful may have to change antiarrythmic, has been on amiodarone in the past as well Tsh, cbc, bmet today   F/u 2 weeks in afib clinic after cardioversion  Lupita Leash [facility] [address] [place], [place] [address] [phone]

## 2017-09-22 NOTE — Patient Instructions (Addendum)
Cardioversion scheduled for Friday, April 26th  - Ar[facility] at [facility] and go to admitting at 12pm  -Do not eat or drink anything after midnight the night prior to your procedure.  - Take all your medication with a sip of water prior to arrival.  - You will not be able to drive home after your procedure.

## 2017-09-27 ENCOUNTER — Ambulatory Visit (INDEPENDENT_AMBULATORY_CARE_PROVIDER_SITE_OTHER): Payer: BC Managed Care – PPO | Admitting: Ophthalmology

## 2017-09-29 ENCOUNTER — Other Ambulatory Visit (HOSPITAL_COMMUNITY): Payer: Self-pay | Admitting: *Deleted

## 2017-09-30 ENCOUNTER — Encounter (HOSPITAL_COMMUNITY): Payer: Self-pay | Admitting: *Deleted

## 2017-09-30 ENCOUNTER — Encounter (HOSPITAL_COMMUNITY)
Admission: RE | Disposition: A | Discharge status: Home / Self Care | Payer: Self-pay | Source: Ambulatory Visit | Attending: Cardiovascular Disease

## 2017-09-30 ENCOUNTER — Ambulatory Visit (HOSPITAL_COMMUNITY)
Admission: RE | Admit: 2017-09-30 | Discharge: 2017-09-30 | Disposition: A | Discharge status: Home / Self Care | Payer: Medicare Other | Source: Ambulatory Visit | Attending: Cardiovascular Disease | Admitting: Cardiovascular Disease

## 2017-09-30 ENCOUNTER — Ambulatory Visit (HOSPITAL_COMMUNITY): Payer: Medicare Other | Admitting: Certified Registered Nurse Anesthetist

## 2017-09-30 ENCOUNTER — Other Ambulatory Visit: Payer: Self-pay

## 2017-09-30 DIAGNOSIS — Z79899 Other long term (current) drug therapy: Secondary | ICD-10-CM | POA: Diagnosis not present

## 2017-09-30 DIAGNOSIS — Z7901 Long term (current) use of anticoagulants: Secondary | ICD-10-CM | POA: Insufficient documentation

## 2017-09-30 DIAGNOSIS — E785 Hyperlipidemia, unspecified: Secondary | ICD-10-CM | POA: Diagnosis not present

## 2017-09-30 DIAGNOSIS — I10 Essential (primary) hypertension: Secondary | ICD-10-CM | POA: Insufficient documentation

## 2017-09-30 DIAGNOSIS — E669 Obesity, unspecified: Secondary | ICD-10-CM | POA: Insufficient documentation

## 2017-09-30 DIAGNOSIS — R7303 Prediabetes: Secondary | ICD-10-CM | POA: Insufficient documentation

## 2017-09-30 DIAGNOSIS — Z9889 Other specified postprocedural states: Secondary | ICD-10-CM | POA: Diagnosis not present

## 2017-09-30 DIAGNOSIS — Z888 Allergy status to other drugs, medicaments and biological substances status: Secondary | ICD-10-CM | POA: Insufficient documentation

## 2017-09-30 DIAGNOSIS — E079 Disorder of thyroid, unspecified: Secondary | ICD-10-CM | POA: Insufficient documentation

## 2017-09-30 DIAGNOSIS — Z823 Family history of stroke: Secondary | ICD-10-CM | POA: Diagnosis not present

## 2017-09-30 DIAGNOSIS — Z6826 Body mass index (BMI) 26.0-26.9, adult: Secondary | ICD-10-CM | POA: Insufficient documentation

## 2017-09-30 DIAGNOSIS — I481 Persistent atrial fibrillation: Secondary | ICD-10-CM | POA: Insufficient documentation

## 2017-09-30 DIAGNOSIS — Z79890 Hormone replacement therapy (postmenopausal): Secondary | ICD-10-CM | POA: Diagnosis not present

## 2017-09-30 DIAGNOSIS — G4733 Obstructive sleep apnea (adult) (pediatric): Secondary | ICD-10-CM | POA: Diagnosis not present

## 2017-09-30 DIAGNOSIS — Z8249 Family history of ischemic heart disease and other diseases of the circulatory system: Secondary | ICD-10-CM | POA: Insufficient documentation

## 2017-09-30 DIAGNOSIS — I480 Paroxysmal atrial fibrillation: Secondary | ICD-10-CM

## 2017-09-30 HISTORY — PX: CARDIOVERSION: SHX1299

## 2017-09-30 SURGERY — CARDIOVERSION
Anesthesia: General

## 2017-09-30 MED ORDER — SODIUM CHLORIDE 0.9 % IV SOLN
INTRAVENOUS | Status: DC
  Administered 2017-09-30: 12:00:00 via INTRAVENOUS

## 2017-09-30 MED ORDER — PROPOFOL 10 MG/ML IV BOLUS
INTRAVENOUS | Status: DC | PRN
  Administered 2017-09-30: 80 mg via INTRAVENOUS
  Administered 2017-09-30: 20 mg via INTRAVENOUS
  Administered 2017-09-30: 30 mg via INTRAVENOUS

## 2017-09-30 MED ORDER — LIDOCAINE HCL (CARDIAC) PF 100 MG/5ML IV SOSY
PREFILLED_SYRINGE | INTRAVENOUS | Status: DC | PRN
  Administered 2017-09-30: 20 mg via INTRAVENOUS

## 2017-09-30 NOTE — Anesthesia Procedure Notes (Signed)
Procedure Name: General with mask airway Date/Time: 09/30/2017 12:28 PM Performed by: Tillman AbideHawkins, Mikhaila Roh B, CRNA Pre-anesthesia Checklist: Patient identified, Emergency Drugs available, Suction available and Patient being monitored Patient Re-evaluated:Patient Re-evaluated prior to induction Oxygen Delivery Method: Nasal cannula Preoxygenation: Pre-oxygenation with 100% oxygen

## 2017-09-30 NOTE — Interval H&P Note (Signed)
History and Physical Interval Note: No interval changes. Will proceed with DCCV as planned.  09/30/2017 12:18 PM  Anthony Cole  has presented today for surgery, with the diagnosis of afib  The various methods of treatment have been discussed with the patient and family. After consideration of risks, benefits and other options for treatment, the patient has consented to  Procedure(s): CARDIOVERSION (N/A) as a surgical intervention .  The patient's history has been reviewed, patient examined, no change in status, stable for surgery.  I have reviewed the patient's chart and labs.  Questions were answered to the patient's satisfaction.     Prentice DockerSuresh Jadene Stemmer

## 2017-09-30 NOTE — Anesthesia Postprocedure Evaluation (Signed)
Anesthesia Post Note  Patient: Anthony SabinaRoderick B Cole  Procedure(s) Performed: CARDIOVERSION (N/A )     Patient location during evaluation: PACU Anesthesia Type: General Level of consciousness: awake and alert Pain management: pain level controlled Vital Signs Assessment: post-procedure vital signs reviewed and stable Respiratory status: spontaneous breathing, nonlabored ventilation, respiratory function stable and patient connected to nasal cannula oxygen Cardiovascular status: blood pressure returned to baseline and stable Postop Assessment: no apparent nausea or vomiting Anesthetic complications: no    Last Vitals:  Vitals:   09/30/17 1250 09/30/17 1252  BP: (!) 145/90 (!) 145/90  Pulse: 67 (!) 56  Resp: 19 (!) 23  Temp:    SpO2: 97% 98%    Last Pain:  Vitals:   09/30/17 1300  TempSrc:   PainSc: 0-No pain                 [name], [name] E

## 2017-09-30 NOTE — Discharge Instructions (Signed)
Electrical Cardioversion, Care After °This sheet gives you information about how to care for yourself after your procedure. Your health care provider may also give you more specific instructions. If you have problems or questions, contact your health care provider. °What can I expect after the procedure? °After the procedure, it is common to have: °· Some redness on the skin where the shocks were given. ° °Follow these instructions at home: °· Do not drive for 24 hours if you were given a medicine to help you relax (sedative). °· Take over-the-counter and prescription medicines only as told by your health care provider. °· Ask your health care provider how to check your pulse. Check it often. °· Rest for 48 hours after the procedure or as told by your health care provider. °· Avoid or limit your caffeine use as told by your health care provider. °Contact a health care provider if: °· You feel like your heart is beating too quickly or your pulse is not regular. °· You have a serious muscle cramp that does not go away. °Get help right away if: °· You have discomfort in your chest. °· You are dizzy or you feel faint. °· You have trouble breathing or you are short of breath. °· Your speech is slurred. °· You have trouble moving an arm or leg on one side of your body. °· Your fingers or toes turn cold or blue. °This information is not intended to replace advice given to you by your health care provider. Make sure you discuss any questions you have with your health care provider. °Document Released: 03/14/2013 Document Revised: 12/26/2015 Document Reviewed: 11/28/2015 °Elsevier Interactive Patient Education © 2018 [employer] ° °

## 2017-09-30 NOTE — Anesthesia Preprocedure Evaluation (Signed)
Anesthesia Evaluation  Patient identified by MRN, date of birth, ID band Patient awake    Reviewed: Allergy & Precautions, NPO status , Patient's Chart, lab work & pertinent test results  Airway Mallampati: II   Neck ROM: Full    Dental no notable dental hx.    Pulmonary sleep apnea ,    breath sounds clear to auscultation       Cardiovascular hypertension, + dysrhythmias  Rhythm:Irregular Rate:Normal     Neuro/Psych    GI/Hepatic negative GI ROS, Neg liver ROS,   Endo/Other  diabetesHypothyroidism   Renal/GU      Musculoskeletal   Abdominal   Peds  Hematology   Anesthesia Other Findings   Reproductive/Obstetrics                             Anesthesia Physical  Anesthesia Plan  ASA: III  Anesthesia Plan: General   Post-op Pain Management:    Induction: Intravenous  PONV Risk Score and Plan: 2 and Treatment may vary due to age or medical condition  Airway Management Planned: Mask and Natural Airway  Additional Equipment:   Intra-op Plan:   Post-operative Plan:   Informed Consent: I have reviewed the patients History and Physical, chart, labs and discussed the procedure including the risks, benefits and alternatives for the proposed anesthesia with the patient or authorized representative who has indicated his/her understanding and acceptance.     Plan Discussed with: CRNA  Anesthesia Plan Comments:         Anesthesia Quick Evaluation

## 2017-09-30 NOTE — CV Procedure (Signed)
Elective direct current cardioversion  Indication: Persistent atrial fibrillation  Description of procedure: After informed consent was obtained and preprocedure evaluation by Anesthesia, patient was taken to the procedure room. Timeout performed. Sedation was managed completely by the Anesthesia service, please refer to their documentation for details. Anterior and posterior chest pads were placed and connected to a biphasic defibrillator. A single synchronized shock of 150J was delivered resulting in restoration of sinus rhythm. The patient remained hemodynamically stable throughout, and there were no immediate complications noted. Follow-up ECG obtained.  Suresh Koneswaran, M.D., F.A.C.C.  

## 2017-09-30 NOTE — Transfer of Care (Signed)
Immediate Anesthesia Transfer of Care Note  Patient: Anthony SabinaRoderick B Meissner  Procedure(s) Performed: CARDIOVERSION (N/A )  Patient Location: Endoscopy Unit  Anesthesia Type:General  Level of Consciousness: awake and alert   Airway & Oxygen Therapy: Patient Spontanous Breathing and Patient connected to nasal cannula oxygen  Post-op Assessment: Report given to RN, Post -op Vital signs reviewed and stable and Patient moving all extremities X 4  Post vital signs: Reviewed and stable  Last Vitals:  Vitals Value Taken Time  BP    Temp    Pulse    Resp    SpO2      Last Pain:  Vitals:   09/30/17 1209  TempSrc: Oral  PainSc: 0-No pain         Complications: No apparent anesthesia complications

## 2017-10-02 ENCOUNTER — Encounter (HOSPITAL_COMMUNITY): Payer: Self-pay | Admitting: Cardiovascular Disease

## 2017-10-05 ENCOUNTER — Telehealth (HOSPITAL_COMMUNITY): Payer: Self-pay | Admitting: *Deleted

## 2017-10-05 NOTE — Telephone Encounter (Signed)
Patient called in stating he has returned to AFib about 2 days after cardioversion. His rate is controlled. He is unsure of next steps. Discussed with Rudi Coco NP states will need change in AAD to either amiodarone or possibly tikosyn - pt wanted to think about this and call back.

## 2017-10-14 ENCOUNTER — Telehealth: Payer: Self-pay | Admitting: Pharmacist

## 2017-10-14 ENCOUNTER — Encounter (HOSPITAL_COMMUNITY): Payer: Self-pay | Admitting: Nurse Practitioner

## 2017-10-14 ENCOUNTER — Ambulatory Visit (HOSPITAL_COMMUNITY)
Admission: RE | Admit: 2017-10-14 | Discharge: 2017-10-14 | Disposition: A | Discharge status: Home / Self Care | Payer: Medicare Other | Source: Ambulatory Visit | Attending: Nurse Practitioner | Admitting: Nurse Practitioner

## 2017-10-14 VITALS — BP 162/94 | HR 84 | Ht 69.0 in | Wt 181.6 lb

## 2017-10-14 DIAGNOSIS — E039 Hypothyroidism, unspecified: Secondary | ICD-10-CM | POA: Diagnosis not present

## 2017-10-14 DIAGNOSIS — R7303 Prediabetes: Secondary | ICD-10-CM | POA: Diagnosis not present

## 2017-10-14 DIAGNOSIS — Z888 Allergy status to other drugs, medicaments and biological substances status: Secondary | ICD-10-CM | POA: Diagnosis not present

## 2017-10-14 DIAGNOSIS — G4733 Obstructive sleep apnea (adult) (pediatric): Secondary | ICD-10-CM | POA: Diagnosis not present

## 2017-10-14 DIAGNOSIS — I481 Persistent atrial fibrillation: Secondary | ICD-10-CM | POA: Diagnosis not present

## 2017-10-14 DIAGNOSIS — E785 Hyperlipidemia, unspecified: Secondary | ICD-10-CM | POA: Insufficient documentation

## 2017-10-14 DIAGNOSIS — Z7901 Long term (current) use of anticoagulants: Secondary | ICD-10-CM | POA: Insufficient documentation

## 2017-10-14 DIAGNOSIS — Z6826 Body mass index (BMI) 26.0-26.9, adult: Secondary | ICD-10-CM | POA: Diagnosis not present

## 2017-10-14 DIAGNOSIS — I10 Essential (primary) hypertension: Secondary | ICD-10-CM | POA: Insufficient documentation

## 2017-10-14 DIAGNOSIS — I4819 Other persistent atrial fibrillation: Secondary | ICD-10-CM

## 2017-10-14 DIAGNOSIS — I484 Atypical atrial flutter: Secondary | ICD-10-CM | POA: Diagnosis not present

## 2017-10-14 DIAGNOSIS — E669 Obesity, unspecified: Secondary | ICD-10-CM | POA: Diagnosis not present

## 2017-10-14 DIAGNOSIS — Z79899 Other long term (current) drug therapy: Secondary | ICD-10-CM | POA: Diagnosis not present

## 2017-10-14 NOTE — Telephone Encounter (Signed)
Medication list reviewed in anticipation of upcoming Tikosyn initiation. Patient is not taking any contraindicated or QTc prolonging medications. Pt previously on amiodarone - per our record last dose in 2015. Pt most recently on flecainide which has now been discontinued and he will have an appropriate wash out before Tikosyn initiation. Potassium ok on last check. Mag has not been checked.   Patient is anticoagulated on Xarelto on the appropriate dose. Please ensure that patient has not missed any anticoagulation doses in the 3 weeks prior to Tikosyn initiation.   Patient will need to be counseled to avoid use of Benadryl while on Tikosyn and in the 2-3 days prior to Tikosyn initiation.

## 2017-10-14 NOTE — Progress Notes (Signed)
Primary Care Physician: Daisy Floro, MD Referring Physician: Dr. Wyatt Cole is a 67 y.o. male with a h/o afib s/p ablation 08/25/17. He was being seen in the office as pt has noted return of afib since 2 days after ablation. He felt  he was tolerating his afib better since ablation. He was instructed to increase the metoprolol and his was rate controlled. He denied any swallowing difficulties and had minor bruising at the rt groin site. He was set up for successful cardioversion.   He returns to afib clinic, unfortunately has returned to afib. He believes he held in normal rhythm for about a week. No known trigger. Continues to take flecainde 100 mg bid, and xarelto, no missed doses.   F/u in afib clinic 5/10. Remains in atrial flutter at 84 bpm  He called in last week stating that the 2nd  cardioversion did not hold in SR. Discussed with Dr.Allred and he said to offer pt short term amiodarone or Tikosyn. We discussed this in clinic in detail and he believes he would rather try Tikosyn. Seems to be tolerating a flutter well.  Today, he denies symptoms of palpitations, chest pain, shortness of breath, orthopnea, PND, lower extremity edema, dizziness, presyncope, syncope, or neurologic sequela. The patient is tolerating medications without difficulties and is otherwise without complaint today.   Past Medical History:  Diagnosis Date  . Atrial fibrillation (HCC)   . Hyperlipidemia   . Hypertension   . Hypothyroidism   . Obesity (BMI 30-39.9) 01/25/2016  . OSA on CPAP 07/16/2014   Moderate with AHI 19/hr on CPAP at 8cm H2O  . Pre-diabetes    Past Surgical History:  Procedure Laterality Date  . ATRIAL FIBRILLATION ABLATION N/A 08/25/2017   Procedure: ATRIAL FIBRILLATION ABLATION;  Surgeon: Hillis Range, MD;  [facility]: [facility];  Service: Cardiovascular;  Laterality: N/A;  . CARDIOVERSION  11/04/2011   Procedure: CARDIOVERSION;  Surgeon: Lesleigh Noe, MD;   Location: [facility] OR;  Service: Cardiovascular;  Laterality: N/A;  . CARDIOVERSION N/A 07/02/2013   Procedure: CARDIOVERSION;  Surgeon: Lesleigh Noe, MD;  Location: [facility] ENDOSCOPY;  Service: Cardiovascular;  Laterality: N/A;  10:07 elective cardioversion Lido , Propofol ,IV...200 joules, synched, pt presently in A flutter,...cardioverted to NSR...  . CARDIOVERSION N/A 09/19/2013   Procedure: CARDIOVERSION;  Surgeon: Lesleigh Noe, MD;  Location: [facility] ENDOSCOPY;  Service: Cardiovascular;  Laterality: N/A;  . CARDIOVERSION N/A 08/13/2014   Procedure: CARDIOVERSION;  Surgeon: Lars Masson, MD;  Location: [facility] ENDOSCOPY;  Service: Cardiovascular;  Laterality: N/A;  . CARDIOVERSION N/A 05/23/2017   Procedure: CARDIOVERSION;  Surgeon: Vesta Mixer, MD;  Location: [facility] ENDOSCOPY;  Service: Cardiovascular;  Laterality: N/A;  . CARDIOVERSION N/A 09/09/2017   Procedure: CARDIOVERSION;  Surgeon: Pricilla Riffle, MD;  Location: [facility] ENDOSCOPY;  Service: Cardiovascular;  Laterality: N/A;  . CARDIOVERSION N/A 09/30/2017   Procedure: CARDIOVERSION;  Surgeon: Laqueta Linden, MD;  Location: [facility] ENDOSCOPY;  Service: Cardiovascular;  Laterality: N/A;  . EYE SURGERY    . FINGER FRACTURE SURGERY Left "when I was a kid"  . FRACTURE SURGERY    . KNEE ARTHROSCOPY W/ DEBRIDEMENT Bilateral   . RETINAL LASER PROCEDURE Bilateral    "tears on both; not detachments"  . SHOULDER ARTHROSCOPY W/ ROTATOR CUFF REPAIR Bilateral   . STAPEDES SURGERY Right   . TONSILLECTOMY    . TYMPANOPLASTY Bilateral    "skin grafts to ear drums"  Current Outpatient Medications  Medication Sig Dispense Refill  . acyclovir (ZOVIRAX) 200 MG capsule Take 200 mg by mouth 3 (three) times daily as needed (first sign of fever blister).     [name] desonide (DESOWEN) 0.05 % lotion Apply 1 application topically daily as needed (dry skin).    [name] diltiazem (CARDIZEM) 30 MG tablet Take 1 tablet every 4 hours AS NEEDED for AFIB heart rate >100 as  long as top number of blood pressure >100. 45 tablet 1  . doxycycline (VIBRA-TABS) 100 MG tablet Take 100 mg by mouth every morning.     [name] levothyroxine (SYNTHROID, LEVOTHROID) 88 MCG tablet Take 88 mcg by mouth daily before breakfast.     . losartan (COZAAR) 50 MG tablet Take 1 tablet (50 mg total) by mouth daily. 30 tablet 11  . metoprolol succinate (TOPROL-XL) 25 MG 24 hr tablet Take 25 mg by mouth daily.    . Multiple Vitamins-Minerals (PRESERVISION AREDS 2+MULTI VIT PO) Take 1 capsule by mouth 2 (two) times daily.    . pantoprazole (PROTONIX) 40 MG tablet Take 1 tablet (40 mg total) by mouth daily. 45 tablet 0  . rosuvastatin (CRESTOR) 10 MG tablet Take 10 mg by mouth daily.     [facility] 20 MG TABS tablet TAKE 1 TABLET ONCE DAILY. (Patient taking differently: TAKE 1 TABLET ONCE DAILY IN THE EVENING) 90 tablet 2   Current Facility-Administered Medications  Medication Dose Route Frequency Provider Last Rate Last Dose  . 0.9 %  sodium chloride infusion  250 mL Intravenous Continuous [name], MD      . sodium chloride flush (NS) 0.9 % injection 3 mL  3 mL Intravenous Q12H [name], MD      . sodium chloride flush (NS) 0.9 % injection 3 mL  3 mL Intravenous PRN [name], MD        Allergies  Allergen Reactions  . Gabapentin Other (See Comments)    Causes Extreme sedation   . Other     Not sure of name of med-for overactive bladder-caused some adverse reactions, stopped taking immediately-per patient    Social History   Socioeconomic History  . Marital status: Single    Spouse name: Not on file  . Number of children: Not on file  . Years of education: Not on file  . Highest education level: Not on file  Occupational History  . Not on file  Social Needs  . Financial resource strain: Not on file  . Food insecurity:    Worry: Not on file    Inability: Not on file  . Transportation needs:    Medical: Not on file    Non-medical: Not on file  Tobacco Use  .  Smoking status: Never Smoker  . Smokeless tobacco: Never Used  Substance and Sexual Activity  . Alcohol use: No  . Drug use: No  . Sexual activity: Not Currently  Lifestyle  . Physical activity:    Days per week: Not on file    Minutes per session: Not on file  . Stress: Not on file  Relationships  . Social connections:    Talks on phone: Not on file    Gets together: Not on file    Attends religious service: Not on file    Active member of club or organization: Not on file    Attends meetings of clubs or organizations: Not on file    Relationship status: Not on file  . Intimate  partner violence:    Fear of current or ex partner: Not on file    Emotionally abused: Not on file    Physically abused: Not on file    Forced sexual activity: Not on file  Other Topics Concern  . Not on file  Social History Narrative  . Not on file    Family History  Problem Relation Age of Onset  . Hypertension Mother   . Stroke Maternal Grandmother     ROS- All systems are reviewed and negative except as per the HPI above  Physical Exam: Vitals:   10/14/17 1059  BP: (!) 162/94  Pulse: 84  Weight: 181 lb 9.6 oz (82.4 kg)  Height:  (1.753 m)   Wt Readings from Last 3 Encounters:  10/14/17 181 lb 9.6 oz (82.4 kg)  09/30/17 182 lb (82.6 kg)  09/22/17 182 lb (82.6 kg)    Labs: Lab Results  Component Value Date   NA 139 09/22/2017   K 4.0 09/22/2017   CL 103 09/22/2017   CO2 28 09/22/2017   GLUCOSE 121 (H) 09/22/2017   BUN 15 09/22/2017   CREATININE 0.86 09/22/2017   CALCIUM 9.2 09/22/2017   Lab Results  Component Value Date   INR 1.4 (H) 05/18/2017   No results found for: CHOL, HDL, LDLCALC, TRIG   GEN- The patient is well appearing, alert and oriented x 3 today.   Head- normocephalic, atraumatic Eyes-  Sclera clear, conjunctiva pink Ears- hearing intact Oropharynx- clear Neck- supple, no JVP Lymph- no cervical lymphadenopathy Lungs- Clear to ausculation  bilaterally, normal work of breathing Heart- irregular rate and rhythm, no murmurs, rubs or gallops, PMI not laterally displaced GI- soft, NT, ND, + BS Extremities- no clubbing, cyanosis, or edema MS- no significant deformity or atrophy Skin- no rash or lesion Psych- euthymic mood, full affect Neuro- strength and sensation are intact  EKG-aflutter with variable block, qrs int 82 bpm, qrs int 82 ms, qtc 430 ms Epic records reviewed    Assessment and Plan: 1.Persisitent afib/flutter s/p ablation 3/21  Cardioversion x 2, s/p ablation with ERAF Continue metoprolol 25 mg qd  Now off flecainide as it was not keeping him in rhythm  Continue xarelto  20 mg a daily, no missed does  Long discussion re antiarrythmic's, short term amio or Tikosyn He would like to try Tikosyn, for admission 5/21 General  precautions re Tikosyn discussed He can afford drug Qtc interval acceptable at 430 ms  F/u 5/21 for admission for [facility] [address] [place], [place] [address] [phone]

## 2017-10-25 ENCOUNTER — Ambulatory Visit (HOSPITAL_COMMUNITY)
Admission: RE | Admit: 2017-10-25 | Discharge: 2017-10-25 | Disposition: A | Discharge status: Home / Self Care | Payer: Medicare Other | Source: Ambulatory Visit | Attending: Nurse Practitioner | Admitting: Nurse Practitioner

## 2017-10-25 ENCOUNTER — Encounter (HOSPITAL_COMMUNITY): Payer: Self-pay | Admitting: General Practice

## 2017-10-25 ENCOUNTER — Telehealth: Payer: Self-pay | Admitting: Cardiology

## 2017-10-25 ENCOUNTER — Encounter (HOSPITAL_COMMUNITY): Payer: Self-pay | Admitting: Nurse Practitioner

## 2017-10-25 ENCOUNTER — Other Ambulatory Visit: Payer: Self-pay

## 2017-10-25 ENCOUNTER — Inpatient Hospital Stay (HOSPITAL_COMMUNITY)
Admission: AD | Admit: 2017-10-25 | Discharge: 2017-10-28 | DRG: 310 | Disposition: A | Discharge status: Home / Self Care | Payer: Medicare Other | Source: Ambulatory Visit | Attending: Internal Medicine | Admitting: Internal Medicine

## 2017-10-25 VITALS — BP 136/86 | HR 86 | Ht 69.0 in | Wt 184.0 lb

## 2017-10-25 DIAGNOSIS — Z5181 Encounter for therapeutic drug level monitoring: Secondary | ICD-10-CM

## 2017-10-25 DIAGNOSIS — Z7901 Long term (current) use of anticoagulants: Secondary | ICD-10-CM

## 2017-10-25 DIAGNOSIS — E039 Hypothyroidism, unspecified: Secondary | ICD-10-CM | POA: Diagnosis present

## 2017-10-25 DIAGNOSIS — E785 Hyperlipidemia, unspecified: Secondary | ICD-10-CM | POA: Diagnosis present

## 2017-10-25 DIAGNOSIS — E119 Type 2 diabetes mellitus without complications: Secondary | ICD-10-CM | POA: Diagnosis present

## 2017-10-25 DIAGNOSIS — I10 Essential (primary) hypertension: Secondary | ICD-10-CM | POA: Diagnosis present

## 2017-10-25 DIAGNOSIS — I480 Paroxysmal atrial fibrillation: Secondary | ICD-10-CM

## 2017-10-25 DIAGNOSIS — I481 Persistent atrial fibrillation: Secondary | ICD-10-CM | POA: Diagnosis present

## 2017-10-25 DIAGNOSIS — Z79899 Other long term (current) drug therapy: Secondary | ICD-10-CM | POA: Diagnosis not present

## 2017-10-25 DIAGNOSIS — Z888 Allergy status to other drugs, medicaments and biological substances status: Secondary | ICD-10-CM | POA: Diagnosis not present

## 2017-10-25 DIAGNOSIS — M1990 Unspecified osteoarthritis, unspecified site: Secondary | ICD-10-CM | POA: Diagnosis present

## 2017-10-25 DIAGNOSIS — I4819 Other persistent atrial fibrillation: Secondary | ICD-10-CM | POA: Diagnosis present

## 2017-10-25 DIAGNOSIS — I4892 Unspecified atrial flutter: Secondary | ICD-10-CM | POA: Diagnosis present

## 2017-10-25 DIAGNOSIS — Z9989 Dependence on other enabling machines and devices: Secondary | ICD-10-CM | POA: Diagnosis not present

## 2017-10-25 DIAGNOSIS — G4733 Obstructive sleep apnea (adult) (pediatric): Secondary | ICD-10-CM | POA: Diagnosis present

## 2017-10-25 DIAGNOSIS — I484 Atypical atrial flutter: Secondary | ICD-10-CM

## 2017-10-25 HISTORY — DX: Encounter for therapeutic drug level monitoring: Z51.81

## 2017-10-25 HISTORY — DX: Other long term (current) drug therapy: Z79.899

## 2017-10-25 HISTORY — DX: Unspecified osteoarthritis, unspecified site: M19.90

## 2017-10-25 LAB — BASIC METABOLIC PANEL
ANION GAP: 7 (ref 5–15)
Anion gap: 8 (ref 5–15)
BUN: 15 mg/dL (ref 6–20)
BUN: 15 mg/dL (ref 6–20)
CALCIUM: 9.5 mg/dL (ref 8.9–10.3)
CHLORIDE: 101 mmol/L (ref 101–111)
CHLORIDE: 103 mmol/L (ref 101–111)
CO2: 32 mmol/L (ref 22–32)
CO2: 30 mmol/L (ref 22–32)
CREATININE: 0.87 mg/dL (ref 0.61–1.24)
CREATININE: 0.93 mg/dL (ref 0.61–1.24)
Calcium: 9.3 mg/dL (ref 8.9–10.3)
GFR calc Af Amer: >60 mL/min (ref >60)
GFR calc non Af Amer: >60 mL/min (ref >60)
Glucose, Bld: 121 mg/dL — ABNORMAL HIGH (ref 65–99)
Glucose, Bld: 113 mg/dL — ABNORMAL HIGH (ref 65–99)
Potassium: 3.7 mmol/L (ref 3.5–5.1)
Potassium: 4.7 mmol/L (ref 3.5–5.1)
SODIUM: 140 mmol/L (ref 135–145)
SODIUM: 141 mmol/L (ref 135–145)

## 2017-10-25 LAB — MAGNESIUM: Magnesium: 2.1 mg/dL (ref 1.7–2.4)

## 2017-10-25 MED ORDER — POTASSIUM CHLORIDE CRYS ER 20 MEQ PO TBCR
40.0000 meq | EXTENDED_RELEASE_TABLET | Freq: Once | ORAL | Status: AC
  Administered 2017-10-25: 40 meq via ORAL
  Filled 2017-10-25: qty 2

## 2017-10-25 MED ORDER — ROSUVASTATIN CALCIUM 10 MG PO TABS
10.0000 mg | ORAL_TABLET | Freq: Every day | ORAL | Status: DC
  Administered 2017-10-26 – 2017-10-28 (×3): 10 mg via ORAL
  Filled 2017-10-25 (×3): qty 1

## 2017-10-25 MED ORDER — DOXYCYCLINE HYCLATE 100 MG PO TABS: 100.0000 mg | ORAL_TABLET | Freq: Every day | ORAL | Status: DC | PRN

## 2017-10-25 MED ORDER — DESONIDE 0.05 % EX OINT
1.0000 "application " | TOPICAL_OINTMENT | Freq: Every day | CUTANEOUS | Status: DC | PRN
  Filled 2017-10-25: qty 15

## 2017-10-25 MED ORDER — SODIUM CHLORIDE 0.9% FLUSH: 3.0000 mL | INTRAVENOUS | Status: DC | PRN

## 2017-10-25 MED ORDER — RIVAROXABAN 20 MG PO TABS
20.0000 mg | ORAL_TABLET | Freq: Every day | ORAL | Status: DC
  Administered 2017-10-25 – 2017-10-27 (×3): 20 mg via ORAL
  Filled 2017-10-25 (×3): qty 1

## 2017-10-25 MED ORDER — LOSARTAN POTASSIUM 50 MG PO TABS
50.0000 mg | ORAL_TABLET | Freq: Every day | ORAL | Status: DC
  Administered 2017-10-26 – 2017-10-28 (×3): 50 mg via ORAL
  Filled 2017-10-25 (×3): qty 1

## 2017-10-25 MED ORDER — SODIUM CHLORIDE 0.9 % IV SOLN: 250.0000 mL | INTRAVENOUS | Status: DC | PRN

## 2017-10-25 MED ORDER — SODIUM CHLORIDE 0.9% FLUSH
3.0000 mL | Freq: Two times a day (BID) | INTRAVENOUS | Status: DC
  Administered 2017-10-26 – 2017-10-28 (×5): 3 mL via INTRAVENOUS

## 2017-10-25 MED ORDER — LEVOTHYROXINE SODIUM 88 MCG PO TABS
88.0000 ug | ORAL_TABLET | Freq: Every day | ORAL | Status: DC
Start: 2017-10-26 — End: 2017-10-28
  Administered 2017-10-26 – 2017-10-28 (×3): 88 ug via ORAL
  Filled 2017-10-25 (×3): qty 1

## 2017-10-25 MED ORDER — RIVAROXABAN 20 MG PO TABS: 20.0000 mg | ORAL_TABLET | Freq: Every day | ORAL | Status: DC

## 2017-10-25 MED ORDER — PANTOPRAZOLE SODIUM 40 MG PO TBEC
40.0000 mg | DELAYED_RELEASE_TABLET | Freq: Every day | ORAL | Status: DC
  Administered 2017-10-26 – 2017-10-28 (×3): 40 mg via ORAL
  Filled 2017-10-25 (×3): qty 1

## 2017-10-25 MED ORDER — DOFETILIDE 500 MCG PO CAPS
500.0000 ug | ORAL_CAPSULE | Freq: Two times a day (BID) | ORAL | Status: DC
  Administered 2017-10-25 – 2017-10-28 (×6): 500 ug via ORAL
  Filled 2017-10-25 (×7): qty 1

## 2017-10-25 MED ORDER — METOPROLOL SUCCINATE ER 25 MG PO TB24
25.0000 mg | ORAL_TABLET | Freq: Every day | ORAL | Status: DC
  Administered 2017-10-26 – 2017-10-28 (×3): 25 mg via ORAL
  Filled 2017-10-25 (×3): qty 1

## 2017-10-25 MED ORDER — ACYCLOVIR 200 MG PO CAPS
200.0000 mg | ORAL_CAPSULE | Freq: Two times a day (BID) | ORAL | Status: DC | PRN
  Filled 2017-10-25: qty 1

## 2017-10-25 NOTE — Progress Notes (Signed)
Patient placed on CPAP using patients tubing and mask from home and hospital machine.  Room air.  9 cmH20 as per home regimen.  Patient is familiar with equipment and procedure.

## 2017-10-25 NOTE — Progress Notes (Signed)
Pharmacy Review for Dofetilide (Tikosyn) Initiation  Admit Complaint: 67 y.o. male admitted 10/25/2017 with atrial fibrillation to be initiated on dofetilide.   Assessment:  Patient Exclusion Criteria: If any screening criteria checked as "Yes", then  patient  should NOT receive dofetilide until criteria item is corrected. If "Yes" please indicate correction plan.  YES  NO Patient  Exclusion Criteria Correction Plan    Baseline QTc interval is greater than or equal to 440 msec. IF above YES box checked dofetilide contraindicated unless patient has ICD; then may proceed if QTc 500-550 msec or with known ventricular conduction abnormalities may proceed with QTc 550-600 msec. QTc =       Magnesium level is less than 1.8 mEq/l : Last magnesium:  Lab Results  Component Value Date   MG 2.1 10/25/2017           Potassium level is less than 4 mEq/l : Last potassium:  Lab Results  Component Value Date   K 3.7 10/25/2017       Provider has ordered one time oral 40 mEq dose, suggested replacing IV once patient has IV access    Patient is known or suspected to have a digoxin level greater than 2 ng/ml: No results found for: DIGOXIN      Creatinine clearance less than 20 ml/min (calculated using Cockcroft-Gault, actual body weight and serum creatinine): Estimated Creatinine Clearance: 87.4 mL/min (by C-G formula based on SCr of 0.87 mg/dL).      Patient has received drugs known to prolong the QT intervals within the last 48 hours (phenothiazines, tricyclics or tetracyclic antidepressants, erythromycin, H-1 antihistamines, cisapride, fluoroquinolones, azithromycin). Drugs not listed above may have an, as yet, undetected potential to prolong the QT interval, updated information on QT prolonging agents is available at this website:QT prolonging agents     Patient received a dose of hydrochlorothiazide (Oretic) alone or in any combination including triamterene  (Dyazide, Maxzide) in the last 48 hours.     Patient received a medication known to increase dofetilide plasma concentrations prior to initial dofetilide dose:  . Trimethoprim (Primsol, Proloprim) in the last 36 hours . Verapamil (Calan, Verelan) in the last 36 hours or a sustained release dose in the last 72 hours . Megestrol (Megace) in the last 5 days  . Cimetidine (Tagamet) in the last 6 hours . Ketoconazole (Nizoral) in the last 24 hours . Itraconazole (Sporanox) in the last 48 hours  . Prochlorperazine (Compazine) in the last 36 hours      Patient is known to have a history of torsades de pointes; congenital or acquired long QT syndromes.     Patient has received a Class 1 antiarrhythmic with less than 2 half-lives since last dose. (Disopyramide, Quinidine, Procainamide, Lidocaine, Mexiletine, Flecainide, Propafenone)     Patient has received amiodarone therapy in the past 3 months or amiodarone level is greater than 0.3 ng/ml.    Patient has been appropriately anticoagulated with Xarelto.  Ordering provider was confirmed at [email or web address] if they are not listed on the [facility] Authorized Prescribers list.  Goal of Therapy: Follow renal function, electrolytes, potential drug interactions, and dose adjustment. Provide education and 1 week supply at discharge.  Plan:    Physician selected initial dose within range recommended for patients level of renal function - will monitor for response.    Physician selected initial dose outside of range recommended for patients level of renal function - will discuss if the dose should be altered at this time.  Select One Calculated CrCl  Dose q12h   > 60 ml/min 500 mcg   40-60 ml/min 250 mcg   20-40 ml/min 125 mcg   2. Follow up QTc after the first 5 doses, renal function, electrolytes (K & Mg) daily x 3 days, dose adjustment, success of initiation and facilitate 1 week discharge supply as clinically  indicated.  3. Initiate Tikosyn education video (Call [phone] and ask for video # [phone]).  4. Place Enrollment Form on the chart for discharge supply of dofetilide.   Toniann Fail Ezelle Surprenant 1:52 PM 10/25/2017

## 2017-10-25 NOTE — Telephone Encounter (Signed)
New Message   Patient is calling to obtain the settings for his CPAP machine. Please call to discuss.

## 2017-10-25 NOTE — H&P (Signed)
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 male with a h/o afib s/p ablation 08/25/17. He was being seen in the office as pt has noted return of afib since 2 days after ablation. He felt  he was tolerating his afib better since ablation. He was instructed to increase the metoprolol and his was rate controlled. He denied any swallowing difficulties and had minor bruising at the rt groin site. He was set up for successful cardioversion.   He returns to afib clinic, unfortunately has returned to afib. He believes he held in normal rhythm for about a week. No known trigger. Continues to take flecainde 100 mg bid, and xarelto, no missed doses.   F/u in afib clinic 5/10. Remains in atrial flutter at 84 bpm  He called in last week stating that the 2nd  cardioversion did not hold in SR. Discussed with Dr.Jessaca Philippi and he said to offer pt short term amiodarone or Tikosyn. We discussed this in clinic in detail and he believes he would rather try Tikosyn. Seems to be tolerating a flutter well.  Today, he denies symptoms of palpitations, chest pain, shortness of breath, orthopnea, PND, lower extremity edema, dizziness, presyncope, syncope, or neurologic sequela. The patient is tolerating medications without difficulties and is otherwise without complaint today.   Past Medical History:  Diagnosis Date  . Arthritis   . Atrial fibrillation (HCC)   . Diabetes mellitus without complication (HCC)    type 2  . Hyperlipidemia   . Hypertension   . Hypothyroidism   . Obesity (BMI 30-39.9) 01/25/2016  . OSA on CPAP 07/16/2014   Moderate with AHI 19/hr on CPAP at 8cm H2O  . Pre-diabetes   . Visit for monitoring Tikosyn therapy 10/25/2017   Past Surgical History:  Procedure Laterality Date  . ATRIAL FIBRILLATION ABLATION N/A 08/25/2017   Procedure: ATRIAL FIBRILLATION ABLATION;  Surgeon: Hillis Range, MD;  Location: [facility] INVASIVE CV LAB;  Service:  Cardiovascular;  Laterality: N/A;  . CARDIOVERSION  11/04/2011   Procedure: CARDIOVERSION;  Surgeon: Lesleigh Noe, MD;  Location: [facility] OR;  Service: Cardiovascular;  Laterality: N/A;  . CARDIOVERSION N/A 07/02/2013   Procedure: CARDIOVERSION;  Surgeon: Lesleigh Noe, MD;  Location: [facility] ENDOSCOPY;  Service: Cardiovascular;  Laterality: N/A;  10:07 elective cardioversion Lido , Propofol ,IV...200 joules, synched, pt presently in A flutter,...cardioverted to NSR...  . CARDIOVERSION N/A 09/19/2013   Procedure: CARDIOVERSION;  Surgeon: Lesleigh Noe, MD;  Location: [facility] ENDOSCOPY;  Service: Cardiovascular;  Laterality: N/A;  . CARDIOVERSION N/A 08/13/2014   Procedure: CARDIOVERSION;  Surgeon: Lars Masson, MD;  Location: [facility] ENDOSCOPY;  Service: Cardiovascular;  Laterality: N/A;  . CARDIOVERSION N/A 05/23/2017   Procedure: CARDIOVERSION;  Surgeon: Vesta Mixer, MD;  Location: [facility] ENDOSCOPY;  Service: Cardiovascular;  Laterality: N/A;  . CARDIOVERSION N/A 09/09/2017   Procedure: CARDIOVERSION;  Surgeon: Pricilla Riffle, MD;  Location: [facility] ENDOSCOPY;  Service: Cardiovascular;  Laterality: N/A;  . CARDIOVERSION N/A 09/30/2017   Procedure: CARDIOVERSION;  Surgeon: Laqueta Linden, MD;  Location: [facility] ENDOSCOPY;  Service: Cardiovascular;  Laterality: N/A;  . EYE SURGERY    . FINGER FRACTURE SURGERY Left "when I was a kid"  . FRACTURE SURGERY    . KNEE ARTHROSCOPY W/ DEBRIDEMENT Bilateral   . RETINAL LASER PROCEDURE Bilateral    "tears on both; not detachments"  . SHOULDER ARTHROSCOPY W/ ROTATOR CUFF REPAIR Bilateral   .  STAPEDES SURGERY Right   . TONSILLECTOMY    . TYMPANOPLASTY Bilateral    "skin grafts to ear drums"    Current Facility-Administered Medications  Medication Dose Route Frequency Provider Last Rate Last Dose  . 0.9 %  sodium chloride infusion  250 mL Intravenous PRN Glory Buff, [facility] K, NP      . acyclovir (ZOVIRAX) 200 MG capsule 200 mg  200 mg Oral BID PRN Gypsy Balsam K, NP      . desonide (DESOWEN) 0.05 % ointment 1 application  1 application Topical Daily PRN Gypsy Balsam K, NP      . dofetilide (TIKOSYN) capsule 500 mcg  500 mcg Oral BID Gypsy Balsam K, NP   500 mcg at 10/25/17 2013  . [START ON 10/26/2017] levothyroxine (SYNTHROID, LEVOTHROID) tablet 88 mcg  88 mcg Oral QAC breakfast Gypsy Balsam K, NP      . Melene Muller ON 10/26/2017] losartan (COZAAR) tablet 50 mg  50 mg Oral Daily Gypsy Balsam K, NP      . Melene Muller ON 10/26/2017] metoprolol succinate (TOPROL-XL) 24 hr tablet 25 mg  25 mg Oral Daily Gypsy Balsam K, NP      . Melene Muller ON 10/26/2017] pantoprazole (PROTONIX) EC tablet 40 mg  40 mg Oral Daily Seiler, Amber K, NP      . rivaroxaban (XARELTO) tablet 20 mg  20 mg Oral Q supper Gypsy Balsam K, NP   20 mg at 10/25/17 1643  . [START ON 10/26/2017] rosuvastatin (CRESTOR) tablet 10 mg  10 mg Oral Daily Seiler, [facility] K, NP      . sodium chloride flush (NS) 0.9 % injection 3 mL  3 mL Intravenous Q12H Seiler, [facility] K, NP      . sodium chloride flush (NS) 0.9 % injection 3 mL  3 mL Intravenous PRN Marily Lente, NP        Allergies  Allergen Reactions  . Gabapentin Other (See Comments)    Causes Extreme sedation   . Other     Not sure of name of med-for overactive bladder-caused some adverse reactions, stopped taking immediately-per patient    Social History   Socioeconomic History  . Marital status: Single    Spouse name: Not on file  . Number of children: Not on file  . Years of education: Not on file  . Highest education level: Not on file  Occupational History  . Not on file  Social Needs  . Financial resource strain: Not on file  . Food insecurity:    Worry: Not on file    Inability: Not on file  . Transportation needs:    Medical: Not on file    Non-medical: Not on file  Tobacco Use  . Smoking status: Never Smoker  . Smokeless tobacco: Never Used  Substance and Sexual Activity  . Alcohol use: No  . Drug use: No  . Sexual  activity: Not Currently  Lifestyle  . Physical activity:    Days per week: Not on file    Minutes per session: Not on file  . Stress: Not on file  Relationships  . Social connections:    Talks on phone: Not on file    Gets together: Not on file    Attends religious service: Not on file    Active member of club or organization: Not on file    Attends meetings of clubs or organizations: Not on file    Relationship status: Not on file  . Intimate partner violence:  Fear of current or ex partner: Not on file    Emotionally abused: Not on file    Physically abused: Not on file    Forced sexual activity: Not on file  Other Topics Concern  . Not on file  Social History Narrative  . Not on file    Family History  Problem Relation Age of Onset  . Hypertension Mother   . Stroke Maternal Grandmother     ROS- All systems are reviewed and negative except as per the HPI above  Physical Exam: Vitals:   10/25/17 1245 10/25/17 1947  BP: (!) 148/104 (!) 148/104  Pulse: 88 78  Resp:  16  Temp: 97.6 F (36.4 C) 98.1 F (36.7 C)  TempSrc: Oral Oral  SpO2: 96% 96%  Weight: 184 lb 8 oz (83.7 kg)   Height:  (1.778 m)    Wt Readings from Last 3 Encounters:  10/25/17 184 lb 8 oz (83.7 kg)  10/25/17 184 lb (83.5 kg)  10/14/17 181 lb 9.6 oz (82.4 kg)    Labs: Lab Results  Component Value Date   NA 141 10/25/2017   K 4.7 10/25/2017   CL 103 10/25/2017   CO2 30 10/25/2017   GLUCOSE 113 (H) 10/25/2017   BUN 15 10/25/2017   CREATININE 0.93 10/25/2017   CALCIUM 9.5 10/25/2017   MG 2.1 10/25/2017   Lab Results  Component Value Date   INR 1.4 (H) 05/18/2017   No results found for: CHOL, HDL, LDLCALC, TRIG   GEN- The patient is well appearing, alert and oriented x 3 today.   Head- normocephalic, atraumatic Eyes-  Sclera clear, conjunctiva pink Ears- hearing intact Oropharynx- clear Neck- supple, no JVP Lymph- no cervical lymphadenopathy Lungs- Clear to ausculation  bilaterally, normal work of breathing Heart- irregular rate and rhythm, no murmurs, rubs or gallops, PMI not laterally displaced GI- soft, NT, ND, + BS Extremities- no clubbing, cyanosis, or edema MS- no significant deformity or atrophy Skin- no rash or lesion Psych- euthymic mood, full affect Neuro- strength and sensation are intact  EKG-aflutter with variable block at 86 bpm, qrs int 84 bpm, qtc 440 ms Epic records reviewed    Assessment and Plan: 1.Persisitent afib/flutter s/p ablation 08/25/17  Cardioversion x 2, s/p ablation, with ERAF Continue metoprolol 25 mg qd  Now off flecainide as it was not keeping him in rhythm for 2 weeks Continue xarelto  20 mg a daily, no missed doses Long discussion re antiarrythmic's, short term amio or Tikosyn He has decided for Tikosyn, for admission today Drugs screened by PharmD, no qtc prolonging drugs General  precautions re Tikosyn discussed He can afford drug Qtc interval acceptable at 430-440 ms  No benadryl Bmet/mag Cr cl cal at 147.50   To [facility] [address] [place], [place] [address] [phone]   I have seen, examined the patient, and reviewed the above assessment and plan.  Changes to above are made where necessary.  On exam, iRRR.  Pt with ERAF post ablation.  I agree that [facility] is the most prudent option at this time.  Pros and cons of tikosyn discussed at length with the patient today.  He wishes to proceed.  Co Sign: Hillis Range, MD 10/25/2017

## 2017-10-25 NOTE — Progress Notes (Signed)
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 male with a h/o afib s/p ablation 08/25/17. He was being seen in the office as pt has noted return of afib since 2 days after ablation. He felt  he was tolerating his afib better since ablation. He was instructed to increase the metoprolol and his was rate controlled. He denied any swallowing difficulties and had minor bruising at the rt groin site. He was set up for successful cardioversion.   He returns to afib clinic, unfortunately has returned to afib. He believes he held in normal rhythm for about a week. No known trigger. Continues to take flecainde 100 mg bid, and xarelto, no missed doses.   F/u in afib clinic 5/10. Remains in atrial flutter at 84 bpm  He called in last week stating that the 2nd  cardioversion did not hold in SR. Discussed with Dr.Allred and he said to offer pt short term amiodarone or Tikosyn. We discussed this in clinic in detail and he believes he would rather try Tikosyn. Seems to be tolerating a flutter well.  Today, he denies symptoms of palpitations, chest pain, shortness of breath, orthopnea, PND, lower extremity edema, dizziness, presyncope, syncope, or neurologic sequela. The patient is tolerating medications without difficulties and is otherwise without complaint today.   Past Medical History:  Diagnosis Date  . Atrial fibrillation (HCC)   . Hyperlipidemia   . Hypertension   . Hypothyroidism   . Obesity (BMI 30-39.9) 01/25/2016  . OSA on CPAP 07/16/2014   Moderate with AHI 19/hr on CPAP at 8cm H2O  . Pre-diabetes    Past Surgical History:  Procedure Laterality Date  . ATRIAL FIBRILLATION ABLATION N/A 08/25/2017   Procedure: ATRIAL FIBRILLATION ABLATION;  Surgeon: Hillis Range, MD;  Location: [facility] INVASIVE [facility] LAB;  Service: Cardiovascular;  Laterality: N/A;  . CARDIOVERSION  11/04/2011   Procedure: CARDIOVERSION;  Surgeon: Lesleigh Noe, MD;   Location: [facility] OR;  Service: Cardiovascular;  Laterality: N/A;  . CARDIOVERSION N/A 07/02/2013   Procedure: CARDIOVERSION;  Surgeon: Lesleigh Noe, MD;  Location: [facility] ENDOSCOPY;  Service: Cardiovascular;  Laterality: N/A;  10:07 elective cardioversion Lido , Propofol ,IV...200 joules, synched, pt presently in A flutter,...cardioverted to NSR...  . CARDIOVERSION N/A 09/19/2013   Procedure: CARDIOVERSION;  Surgeon: Lesleigh Noe, MD;  Location: [facility] ENDOSCOPY;  Service: Cardiovascular;  Laterality: N/A;  . CARDIOVERSION N/A 08/13/2014   Procedure: CARDIOVERSION;  Surgeon: Lars Masson, MD;  Location: [facility] ENDOSCOPY;  Service: Cardiovascular;  Laterality: N/A;  . CARDIOVERSION N/A 05/23/2017   Procedure: CARDIOVERSION;  Surgeon: Vesta Mixer, MD;  Location: [facility] ENDOSCOPY;  Service: Cardiovascular;  Laterality: N/A;  . CARDIOVERSION N/A 09/09/2017   Procedure: CARDIOVERSION;  Surgeon: Pricilla Riffle, MD;  Location: [facility] ENDOSCOPY;  Service: Cardiovascular;  Laterality: N/A;  . CARDIOVERSION N/A 09/30/2017   Procedure: CARDIOVERSION;  Surgeon: Laqueta Linden, MD;  Location: [facility] ENDOSCOPY;  Service: Cardiovascular;  Laterality: N/A;  . EYE SURGERY    . FINGER FRACTURE SURGERY Left "when I was a kid"  . FRACTURE SURGERY    . KNEE ARTHROSCOPY W/ DEBRIDEMENT Bilateral   . RETINAL LASER PROCEDURE Bilateral    "tears on both; not detachments"  . SHOULDER ARTHROSCOPY W/ ROTATOR CUFF REPAIR Bilateral   . STAPEDES SURGERY Right   . TONSILLECTOMY    . TYMPANOPLASTY Bilateral    "skin grafts to ear drums"  Current Outpatient Medications  Medication Sig Dispense Refill  . acyclovir (ZOVIRAX) 200 MG capsule Take 200 mg by mouth 2 (two) times daily as needed (first sign of fever blister).     [name] desonide (DESOWEN) 0.05 % lotion Apply 1 application topically daily as needed (dry skin).    [name] diltiazem (CARDIZEM) 30 MG tablet Take 1 tablet every 4 hours AS NEEDED for AFIB heart rate >100 as long  as top number of blood pressure >100. 45 tablet 1  . doxycycline (VIBRA-TABS) 100 MG tablet Take 100 mg by mouth daily as needed (rosacea).     [name] levothyroxine (SYNTHROID, LEVOTHROID) 88 MCG tablet Take 88 mcg by mouth daily before breakfast.     . losartan (COZAAR) 50 MG tablet Take 1 tablet (50 mg total) by mouth daily. 30 tablet 11  . metoprolol succinate (TOPROL-XL) 25 MG 24 hr tablet Take 25 mg by mouth daily.    . Multiple Vitamins-Minerals (PRESERVISION AREDS 2+MULTI VIT PO) Take 1 capsule by mouth 2 (two) times daily.    . pantoprazole (PROTONIX) 40 MG tablet Take 1 tablet (40 mg total) by mouth daily. 45 tablet 0  . rosuvastatin (CRESTOR) 10 MG tablet Take 10 mg by mouth daily.     [name] 20 MG TABS tablet TAKE 1 TABLET ONCE DAILY. (Patient taking differently: TAKE 1 TABLET ONCE DAILY IN THE EVENING) 90 tablet 2   Current Facility-Administered Medications  Medication Dose Route Frequency Provider Last Rate Last Dose  . 0.9 %  sodium chloride infusion  250 mL Intravenous Continuous [name], MD      . sodium chloride flush (NS) 0.9 % injection 3 mL  3 mL Intravenous Q12H [name], MD      . sodium chloride flush (NS) 0.9 % injection 3 mL  3 mL Intravenous PRN [name], MD        Allergies  Allergen Reactions  . Gabapentin Other (See Comments)    Causes Extreme sedation   . Other     Not sure of name of med-for overactive bladder-caused some adverse reactions, stopped taking immediately-per patient    Social History   Socioeconomic History  . Marital status: Single    Spouse name: Not on file  . Number of children: Not on file  . Years of education: Not on file  . Highest education level: Not on file  Occupational History  . Not on file  Social Needs  . Financial resource strain: Not on file  . Food insecurity:    Worry: Not on file    Inability: Not on file  . Transportation needs:    Medical: Not on file    Non-medical: Not on file  Tobacco  Use  . Smoking status: Never Smoker  . Smokeless tobacco: Never Used  Substance and Sexual Activity  . Alcohol use: No  . Drug use: No  . Sexual activity: Not Currently  Lifestyle  . Physical activity:    Days per week: Not on file    Minutes per session: Not on file  . Stress: Not on file  Relationships  . Social connections:    Talks on phone: Not on file    Gets together: Not on file    Attends religious service: Not on file    Active member of club or organization: Not on file    Attends meetings of clubs or organizations: Not on file    Relationship status: Not on file  .  Intimate partner violence:    Fear of current or ex partner: Not on file    Emotionally abused: Not on file    Physically abused: Not on file    Forced sexual activity: Not on file  Other Topics Concern  . Not on file  Social History Narrative  . Not on file    Family History  Problem Relation Age of Onset  . Hypertension Mother   . Stroke Maternal Grandmother     ROS- All systems are reviewed and negative except as per the HPI above  Physical Exam: Vitals:   10/25/17 1029  BP: 136/86  Pulse: 86  Weight: 184 lb (83.5 kg)  Height:  (1.753 m)   Wt Readings from Last 3 Encounters:  10/25/17 184 lb (83.5 kg)  10/14/17 181 lb 9.6 oz (82.4 kg)  09/30/17 182 lb (82.6 kg)    Labs: Lab Results  Component Value Date   NA 140 10/25/2017   K 3.7 10/25/2017   CL 101 10/25/2017   CO2 32 10/25/2017   GLUCOSE 121 (H) 10/25/2017   BUN 15 10/25/2017   CREATININE 0.87 10/25/2017   CALCIUM 9.3 10/25/2017   MG 2.1 10/25/2017   Lab Results  Component Value Date   INR 1.4 (H) 05/18/2017   No results found for: CHOL, HDL, LDLCALC, TRIG   GEN- The patient is well appearing, alert and oriented x 3 today.   Head- normocephalic, atraumatic Eyes-  Sclera clear, conjunctiva pink Ears- hearing intact Oropharynx- clear Neck- supple, no JVP Lymph- no cervical lymphadenopathy Lungs- Clear to  ausculation bilaterally, normal work of breathing Heart- irregular rate and rhythm, no murmurs, rubs or gallops, PMI not laterally displaced GI- soft, NT, ND, + BS Extremities- no clubbing, cyanosis, or edema MS- no significant deformity or atrophy Skin- no rash or lesion Psych- euthymic mood, full affect Neuro- strength and sensation are intact  EKG-aflutter with variable block at 86 bpm, qrs int 84 bpm, qtc 440 ms Epic records reviewed    Assessment and Plan: 1.Persisitent afib/flutter s/p ablation 08/25/17  Cardioversion x 2, s/p ablation, with ERAF Continue metoprolol 25 mg qd  Now off flecainide as it was not keeping him in rhythm for 2 weeks Continue xarelto  20 mg a daily, no missed doses Long discussion re antiarrythmic's, short term amio or Tikosyn He has decided for Tikosyn, for admission today Drugs screened by PharmD, no qtc prolonging drugs General  precautions re Tikosyn discussed He can afford drug Qtc interval acceptable at 430-440 ms  No benadryl Bmet/mag Cr cl cal at 147.50   To [employer] [facility] [address] [place], [place] [address] [phone]

## 2017-10-25 NOTE — Care Management Note (Addendum)
Case Management Note  Patient Details  Name: Anthony Cole MRN: [id number] Date of Birth: 06-27-1950  Subjective/Objective: Pt presented for Tikosyn Load: Benefits Check Completed and CM will make pt aware of cost. CM will assist with Rx for 7 day supply no refills via [place]. Pt will need additional Rx with refills. CM will call pharmacy to make sure they can order. ([place]). No further needs from CM at this time.                   Action/Plan: S/W [facility] [place] RX # [phone]   1. TIKOSYN 500 MCG BID  COVER- YES  CO-PAY- $ 64.00  TIER- 3 DRUG  PRIOR APPROVAL- NO    2. DOFETILIDE 500 MCG BID  COVER- YES  CO-PAY- $ 64.00  TIER- 3 DRUG  PRIOR APPROVAL- NO   DEDUCTIBLE HAS BEEN MET   PREFERRED PHARMACY : YES [facility] AND [place] [facility]   Expected Discharge Date:                  Expected Discharge Plan:  Home/Self Care  In-House Referral:  NA  Discharge planning Services  CM Consult, Medication Assistance  Post Acute Care Choice:  NA Choice offered to:  NA  DME Arranged:  N/A DME Agency:  NA  HH Arranged:  NA HH Agency:  NA  Status of Service:  Completed, signed off  If discussed at [place] of Stay Meetings, dates discussed:    Additional Comments: 1211 10-26-17 Jacqlyn Krauss, RN,BSN [phone] CM did speak with pt in regards to Rx for 7 day supply. Pt states he may want to get 90 days supply for $128.00. He needs to think about it. Pt ha some questions in regards to Tikosyn- CM did make sure pharmacy was aware. No further needs from CM at this time.  Bethena Roys, RN 10/25/2017, 4:07 PM

## 2017-10-25 NOTE — Telephone Encounter (Signed)
Patient is admitted to the hospital and needed cpap settings for his hospital issued cpap. I gave hm his settings from his compliance report which was a set pressure of 9 cm H20. Pt is agreeable to treatment.

## 2017-10-26 LAB — BASIC METABOLIC PANEL
ANION GAP: 9 (ref 5–15)
Anion gap: 8 (ref 5–15)
BUN: 15 mg/dL (ref 6–20)
BUN: 20 mg/dL (ref 6–20)
CHLORIDE: 103 mmol/L (ref 101–111)
CHLORIDE: 99 mmol/L — AB (ref 101–111)
CO2: 29 mmol/L (ref 22–32)
CO2: 30 mmol/L (ref 22–32)
Calcium: 9 mg/dL (ref 8.9–10.3)
Calcium: 10 mg/dL (ref 8.9–10.3)
Creatinine, Ser: 0.95 mg/dL (ref 0.61–1.24)
Creatinine, Ser: 1.05 mg/dL (ref 0.61–1.24)
GFR calc Af Amer: >60 mL/min (ref >60)
GFR calc Af Amer: >60 mL/min (ref >60)
GFR calc non Af Amer: >60 mL/min (ref >60)
GFR calc non Af Amer: >60 mL/min (ref >60)
Glucose, Bld: 128 mg/dL — ABNORMAL HIGH (ref 65–99)
Glucose, Bld: 149 mg/dL — ABNORMAL HIGH (ref 65–99)
POTASSIUM: 4.4 mmol/L (ref 3.5–5.1)
Potassium: 3.7 mmol/L (ref 3.5–5.1)
Sodium: 141 mmol/L (ref 135–145)
Sodium: 137 mmol/L (ref 135–145)

## 2017-10-26 LAB — MAGNESIUM: Magnesium: 2.3 mg/dL (ref 1.7–2.4)

## 2017-10-26 LAB — HIV ANTIBODY (ROUTINE TESTING W REFLEX): HIV Screen 4th Generation wRfx: NONREACTIVE

## 2017-10-26 MED ORDER — POTASSIUM CHLORIDE CRYS ER 20 MEQ PO TBCR
40.0000 meq | EXTENDED_RELEASE_TABLET | Freq: Once | ORAL | Status: AC
  Administered 2017-10-26: 40 meq via ORAL
  Filled 2017-10-26: qty 2

## 2017-10-26 NOTE — Progress Notes (Signed)
Pharmacy Review for Dofetilide (Tikosyn) Initiation  Admit Complaint: 67 y.o. male admitted 10/25/2017 with atrial fibrillation to be initiated on dofetilide.   Assessment:  Patient Exclusion Criteria: If any screening criteria checked as "Yes", then  patient  should NOT receive dofetilide until criteria item is corrected. If "Yes" please indicate correction plan.  YES  NO Patient  Exclusion Criteria Correction Plan    Baseline QTc interval is greater than or equal to 440 msec. IF above YES box checked dofetilide contraindicated unless patient has ICD; then may proceed if QTc 500-550 msec or with known ventricular conduction abnormalities may proceed with QTc 550-600 msec. QTc = 4.16     Magnesium level is less than 1.8 mEq/l : Last magnesium:  Lab Results  Component Value Date   MG 2.3 10/26/2017           Potassium level is less than 4 mEq/l : Last potassium:  Lab Results  Component Value Date   K 3.7 10/26/2017       Provider has ordered one time oral 40 mEq dose    Patient is known or suspected to have a digoxin level greater than 2 ng/ml: No results found for: DIGOXIN      Creatinine clearance less than 20 ml/min (calculated using Cockcroft-Gault, actual body weight and serum creatinine): Estimated Creatinine Clearance: 80 mL/min (by C-G formula based on SCr of 0.95 mg/dL).      Patient has received drugs known to prolong the QT intervals within the last 48 hours (phenothiazines, tricyclics or tetracyclic antidepressants, erythromycin, H-1 antihistamines, cisapride, fluoroquinolones, azithromycin). Drugs not listed above may have an, as yet, undetected potential to prolong the QT interval, updated information on QT prolonging agents is available at this website:QT prolonging agents     Patient received a dose of hydrochlorothiazide (Oretic) alone or in any combination including triamterene (Dyazide, Maxzide) in the last 48 hours.      Patient received a medication known to increase dofetilide plasma concentrations prior to initial dofetilide dose:  . Trimethoprim (Primsol, Proloprim) in the last 36 hours . Verapamil (Calan, Verelan) in the last 36 hours or a sustained release dose in the last 72 hours . Megestrol (Megace) in the last 5 days  . Cimetidine (Tagamet) in the last 6 hours . Ketoconazole (Nizoral) in the last 24 hours . Itraconazole (Sporanox) in the last 48 hours  . Prochlorperazine (Compazine) in the last 36 hours      Patient is known to have a history of torsades de pointes; congenital or acquired long QT syndromes.     Patient has received a Class 1 antiarrhythmic with less than 2 half-lives since last dose. (Disopyramide, Quinidine, Procainamide, Lidocaine, Mexiletine, Flecainide, Propafenone)     Patient has received amiodarone therapy in the past 3 months or amiodarone level is greater than 0.3 ng/ml.    Patient has been appropriately anticoagulated with Xarelto.  Ordering provider was confirmed at [email or web address] if they are not listed on the [facility] Authorized Prescribers list.  Goal of Therapy: Follow renal function, electrolytes, potential drug interactions, and dose adjustment. Provide education and 1 week supply at discharge.  Plan:    Physician selected initial dose within range recommended for patients level of renal function - will monitor for response.    Physician selected initial dose outside of range recommended for patients level of renal function - will discuss if the dose should be altered at this time.   Select One Calculated CrCl  Dose q12h  [  x] > 60 ml/min 500 mcg   40-60 ml/min 250 mcg   20-40 ml/min 125 mcg   2. Follow up QTc after the first 5 doses, renal function, electrolytes (K & Mg) daily x 3 days, dose adjustment, success of initiation and facilitate 1 week discharge supply as clinically indicated.  3. Initiate Tikosyn education video (Call  [phone] and ask for video # [phone]).  4. Place Enrollment Form on the chart for discharge supply of dofetilide.   Anthony Cole 2:38 PM 10/26/2017

## 2017-10-26 NOTE — Progress Notes (Signed)
Patient converted from Afib to SR per Cardiac tech and verified by RN. Patient remains alert and orientedX4, walking with strong gait. Infomred MD. Will continue to monitor.

## 2017-10-26 NOTE — Progress Notes (Addendum)
Electrophysiology Rounding Note  Patient Name: Anthony Cole Date of Encounter: 10/26/2017  Primary Cardiologist: Katrinka Blazing Electrophysiologist: Gunnar Hereford   Subjective   The patient is doing well today.  At this time, the patient denies chest pain, shortness of breath, or any new concerns.  Inpatient Medications    Scheduled Meds: . dofetilide  500 mcg Oral BID  . levothyroxine  88 mcg Oral QAC breakfast  . losartan  50 mg Oral Daily  . metoprolol succinate  25 mg Oral Daily  . pantoprazole  40 mg Oral Daily  . rivaroxaban  20 mg Oral Q supper  . rosuvastatin  10 mg Oral Daily  . sodium chloride flush  3 mL Intravenous Q12H   Continuous Infusions: . sodium chloride     PRN Meds: sodium chloride, acyclovir, desonide, sodium chloride flush   Vital Signs    Vitals:   10/25/17 1245 10/25/17 1947 10/25/17 2200 10/26/17 0341  BP: (!) 148/104 (!) 148/104  (!) 154/97  Pulse: 88 78 74 73  Resp:  Temp: 97.6 F (36.4 C) 98.1 F (36.7 C)  98 F (36.7 C)  TempSrc: Oral Oral  Oral  SpO2: 96% 96% 96% 100%  Weight: 184 lb 8 oz (83.7 kg)   183 lb 12.8 oz (83.4 kg)  Height:  (1.778 m)       Intake/Output Summary (Last 24 hours) at 10/26/2017 0616 Last data filed at 10/25/2017 1300 Gross per 24 hour  Intake 180 ml  Output -  Net 180 ml   Filed Weights   10/25/17 1245 10/26/17 0341  Weight: 184 lb 8 oz (83.7 kg) 183 lb 12.8 oz (83.4 kg)    Physical Exam    GEN- The patient is well appearing, alert and oriented x 3 today.   Head- normocephalic, atraumatic Eyes-  Sclera clear, conjunctiva pink Ears- hearing intact Oropharynx- clear Neck- supple Lungs- Clear to ausculation bilaterally, normal work of breathing Heart- Irregular rate and rhythm  GI- soft, NT, ND, + BS Extremities- no clubbing, cyanosis, or edema Skin- no rash or lesion Psych- euthymic mood, full affect Neuro- strength and sensation are intact  Labs    CBC No results for input(s):  WBC, NEUTROABS, HGB, HCT, MCV, PLT in the last 72 hours. Basic Metabolic Panel Recent Labs    65/78/46 1109 10/25/17 1420  NA 140 141  K 3.7 4.7  CL 101 103  CO2 32 30  GLUCOSE 121* 113*  BUN 15 15  CREATININE 0.87 0.93  CALCIUM 9.3 9.5  MG 2.1  --      Telemetry    AF (personally reviewed)  Radiology    No results found.   Patient Profile     Anthony Cole is a 67 y.o. male admitted for Tikosyn load   Assessment & Plan    1.  Persistent atrial fibrillation Admitted for Tikosyn load QTc stable BMET, Mg stable today Continue OAC for CHADS2VASC of 3 Plan DCCV tomorrow if still in AF (NPO after midnight tonight)  2.  OSA Compliant with CPAP  3.  HTN Stable No change required today   Signed, Gypsy Balsam, NP  10/26/2017, 6:16 AM   I have seen, examined the patient, and reviewed the above assessment and plan.  Changes to above are made where necessary.  On exam, iRRR.  QT is stable.  Continue tikosyn initiation.  NPO after midnight for possible cardioversion if still in AF tomorrow.  Co Sign: Hillis Range,  MD 10/26/2017 7:49 AM

## 2017-10-27 ENCOUNTER — Ambulatory Visit (HOSPITAL_COMMUNITY): Admission: RE | Admit: 2017-10-27 | Payer: Medicare Other | Source: Ambulatory Visit | Admitting: Cardiology

## 2017-10-27 ENCOUNTER — Encounter (HOSPITAL_COMMUNITY)
Admission: AD | Disposition: A | Discharge status: Home / Self Care | Payer: Self-pay | Source: Ambulatory Visit | Attending: Internal Medicine

## 2017-10-27 LAB — BASIC METABOLIC PANEL
Anion gap: 8 (ref 5–15)
BUN: 20 mg/dL (ref 6–20)
CO2: 29 mmol/L (ref 22–32)
Calcium: 9.5 mg/dL (ref 8.9–10.3)
Chloride: 102 mmol/L (ref 101–111)
Creatinine, Ser: 0.99 mg/dL (ref 0.61–1.24)
GFR calc Af Amer: >60 mL/min (ref >60)
GLUCOSE: 114 mg/dL — AB (ref 65–99)
POTASSIUM: 4.1 mmol/L (ref 3.5–5.1)
Sodium: 139 mmol/L (ref 135–145)

## 2017-10-27 LAB — MAGNESIUM: MAGNESIUM: 2.5 mg/dL — AB (ref 1.7–2.4)

## 2017-10-27 SURGERY — CARDIOVERSION
Anesthesia: General

## 2017-10-27 MED ORDER — DOXYCYCLINE HYCLATE 100 MG PO TABS
100.0000 mg | ORAL_TABLET | Freq: Two times a day (BID) | ORAL | Status: DC
  Administered 2017-10-27 – 2017-10-28 (×2): 100 mg via ORAL
  Filled 2017-10-27 (×2): qty 1

## 2017-10-27 NOTE — Discharge Summary (Addendum)
ELECTROPHYSIOLOGY PROCEDURE DISCHARGE SUMMARY    Patient ID: Anthony Cole,  MRN: [id number], DOB/AGE: Jun 04, 1951 67 y.o.  Admit date: 10/25/2017 Discharge date: 10/28/2017  Primary Care Physician: Daisy Floro, MD Primary Cardiologist: Katrinka Blazing Electrophysiologist: Mylie Mccurley  Primary Discharge Diagnosis:  1.  Persistent atrial fibrillation status post Tikosyn loading this admission  Secondary Discharge Diagnosis:  1.  OSA 2.  HTN 3.  Diabetes 4.  Hyperlipidemia  Allergies  Allergen Reactions  . Gabapentin Other (See Comments)    Causes Extreme sedation   . Other     Not sure of name of med-for overactive bladder-caused some adverse reactions, stopped taking immediately-per patient     Procedures This Admission:  1.  Tikosyn loading  Brief HPI: Anthony Cole is a 67 y.o. male with a past medical history as noted above.  They were referred to EP in the outpatient setting for treatment options of atrial fibrillation.  Risks, benefits, and alternatives to Tikosyn were reviewed with the patient who wished to proceed.    Hospital Course:  The patient was admitted and Tikosyn was initiated.  Renal function and electrolytes were followed during the hospitalization.  Their QTc remained stable. They were monitored until discharge on telemetry which demonstrated SR with conversion to AF the day of discharge.  On the day of discharge, they were examined by Dr Johney Frame who considered them stable for discharge to home.  Follow-up has been arranged with AF clinic in 1 week. If still in AF at 1 week visit, will plan DCCV.   Physical Exam: Vitals:   10/27/17 1329 10/27/17 2023 10/27/17 2244 10/28/17 0608  BP: (!) 147/81 (!) 148/89  (!) 176/110  Pulse: 72 65 72 63  Resp: Temp: 97.8 F (36.6 C) 97.6 F (36.4 C)  97.7 F (36.5 C)  TempSrc: Oral Oral  Oral  SpO2: 99% 99% 99% 100%  Weight:    179 lb 3.7 oz (81.3 kg)  Height:        GEN- The patient is well  appearing, alert and oriented x 3 today.   HEENT: normocephalic, atraumatic; sclera clear, conjunctiva pink; hearing intact; oropharynx clear; neck supple  Lungs- Clear to ausculation bilaterally, normal work of breathing.  No wheezes, rales, rhonchi Heart- Irregular rate and rhythm, no murmurs, rubs or gallops  GI- soft, non-tender, non-distended, bowel sounds present  Extremities- no clubbing, cyanosis, or edema; DP/PT/radial pulses 2+ bilaterally MS- no significant deformity or atrophy Skin- warm and dry, no rash or lesion Psych- euthymic mood, full affect Neuro- strength and sensation are intact   Labs:   Lab Results  Component Value Date   WBC 7.7 09/22/2017   HGB 14.4 09/22/2017   HCT 41.8 09/22/2017   MCV 91.3 09/22/2017   PLT 255 09/22/2017    Recent Labs  Lab 10/28/17 0532  NA 143  K 4.1  CL 103  CO2 31  BUN 15  CREATININE 0.89  CALCIUM 9.3  GLUCOSE 121*     Discharge Medications:  Allergies as of 10/28/2017      Reactions   Gabapentin Other (See Comments)   Causes Extreme sedation   Other    Not sure of name of med-for overactive bladder-caused some adverse reactions, stopped taking immediately-per patient      Medication List    TAKE these medications   acyclovir 200 MG capsule Commonly known as:  ZOVIRAX Take 200 mg by mouth 2 (two) times daily as needed (  first sign of fever blister).   desonide 0.05 % lotion Commonly known as:  DESOWEN Apply 1 application topically daily as needed (dry skin).   diltiazem 30 MG tablet Commonly known as:  CARDIZEM Take 1 tablet every 4 hours AS NEEDED for AFIB heart rate >100 as long as top number of blood pressure >100.   dofetilide 500 MCG capsule Commonly known as:  TIKOSYN Take 1 capsule (500 mcg total) by mouth 2 (two) times daily.   doxycycline 100 MG tablet Commonly known as:  VIBRA-TABS Take 100 mg by mouth daily as needed (rosacea).   levothyroxine 88 MCG tablet Commonly known as:  SYNTHROID,  LEVOTHROID Take 88 mcg by mouth daily before breakfast.   losartan 50 MG tablet Commonly known as:  COZAAR Take 1 tablet (50 mg total) by mouth daily.   metoprolol succinate 25 MG 24 hr tablet Commonly known as:  TOPROL-XL Take 25 mg by mouth daily.   pantoprazole 40 MG tablet Commonly known as:  PROTONIX Take 1 tablet (40 mg total) by mouth daily.   PRESERVISION AREDS 2+MULTI VIT PO Take 1 capsule by mouth 2 (two) times daily.   rosuvastatin 10 MG tablet Commonly known as:  CRESTOR Take 10 mg by mouth daily.   XARELTO 20 MG Tabs tablet Generic drug:  rivaroxaban TAKE 1 TABLET ONCE DAILY. What changed:    how much to take  how to take this  when to take this       Disposition:  Discharge Instructions    Diet - low sodium heart healthy   Complete by:  As directed    Increase activity slowly   Complete by:  As directed      Follow-up Information    [facility] Follow up on 11/04/2017.   Specialty:  Cardiology Why:  at [facility] information: [address] [id number] [place] [address] [phone]       Hillis Range, MD Follow up on 11/28/2017.   Specialty:  Cardiology Why:  at 12:15PM  Contact information: [address] [place] [address] [phone]           Duration of Discharge Encounter: Greater than 30 minutes including physician time.  Signed, Gypsy Balsam, NP 10/28/2017 8:24 AM  I have seen, examined the patient, and reviewed the above assessment and plan.  Changes to above are made where necessary.  On exam, iRRR.  Stable QTc.  DC to home with close follow-up in the AF clinic  Co Sign: Hillis Range, MD 10/28/2017 5:38 PM

## 2017-10-27 NOTE — Progress Notes (Signed)
Reviewed chart for scheduled cardioversion today, sinus rhythm on 12 lead EKG. Procedure cancelled primary RN notified.

## 2017-10-27 NOTE — Progress Notes (Signed)
MEDICATION RELATED NOTE   Pharmacy Consult for Drug Interaction Medication Review Indication: New Tikosyn  Reviewed med profile for potential drug interactions with Tikosyn - specifically for his Preservision AREDS 2.  Listed are the contents of each gel capsule:  Vitamin C (ascorbic acid) -- 226 mg. Vitamin E (dl-alpha tocopherol acetate) -- 200 IU. Zinc (as zinc oxide) -- 34.8 mg. Copper (cupric oxide) -- 0.8 mg. Lutein -- 5 mg.  Plan:  - No drug interactions noted for this combination and other medications are fine as well.  Nadara Mustard, PharmD., MS Clinical Pharmacist Pager:  [phone] Thank you for allowing pharmacy to be part of this patients care team. 10/27/2017,12:55 PM

## 2017-10-27 NOTE — Progress Notes (Signed)
   Electrophysiology Rounding Note  Patient Name: Anthony Cole Date of Encounter: 10/27/2017  Primary Cardiologist: Katrinka Blazing Electrophysiologist: Melena Hayes   Subjective   The patient is doing well today.  At this time, the patient denies chest pain, shortness of breath, or any new concerns.  Inpatient Medications    Scheduled Meds: . dofetilide  500 mcg Oral BID  . levothyroxine  88 mcg Oral QAC breakfast  . losartan  50 mg Oral Daily  . metoprolol succinate  25 mg Oral Daily  . pantoprazole  40 mg Oral Daily  . rivaroxaban  20 mg Oral Q supper  . rosuvastatin  10 mg Oral Daily  . sodium chloride flush  3 mL Intravenous Q12H   Continuous Infusions: . sodium chloride     PRN Meds: sodium chloride, acyclovir, desonide, sodium chloride flush   Vital Signs    Vitals:   10/26/17 2101 10/26/17 2105 10/27/17 0500 10/27/17 1030  BP: (!) 151/92 (!) 153/94 (!) 163/88 (!) 162/79  Pulse: 73 72 64 66  Resp: 18     Temp: 97.7 F (36.5 C)  98.3 F (36.8 C)   TempSrc: Oral  Oral   SpO2: 99%  100%   Weight:   183 lb 6.4 oz (83.2 kg)   Height:        Intake/Output Summary (Last 24 hours) at 10/27/2017 1051 Last data filed at 10/27/2017 0000 Gross per 24 hour  Intake 720 ml  Output -  Net 720 ml   Filed Weights   10/25/17 1245 10/26/17 0341 10/27/17 0500  Weight: 184 lb 8 oz (83.7 kg) 183 lb 12.8 oz (83.4 kg) 183 lb 6.4 oz (83.2 kg)    Physical Exam    GEN- The patient is well appearing, alert and oriented x 3 today.   Head- normocephalic, atraumatic Eyes-  Sclera clear, conjunctiva pink Ears- hearing intact Oropharynx- clear Neck- supple Lungs- Clear to ausculation bilaterally, normal work of breathing Heart- Regular rate and rhythm  GI- soft, NT, ND, + BS Extremities- no clubbing, cyanosis, or edema Skin- no rash or lesion Psych- euthymic mood, full affect Neuro- strength and sensation are intact  Labs    CBC No results for input(s): WBC, NEUTROABS, HGB, HCT,  MCV, PLT in the last 72 hours. Basic Metabolic Panel Recent Labs    16/10/96 0501 10/26/17 1809 10/27/17 0325  NA 141 137 139  K 3.7 4.4 4.1  CL 103 99* 102  CO2 GLUCOSE 128* 149* 114*  BUN CREATININE 0.95 1.05 0.99  CALCIUM 9.0 10.0 9.5  MG 2.3  --  2.5*     Telemetry    SR (personally reviewed)  Radiology    No results found.   Patient Profile     Anthony Cole is a 67 y.o. male admitted for Tikosyn load.    Assessment & Plan    1.  Persistent atrial fibrillation Admitted for Tikosyn load Converted to SR yesterday BMET, Mg stable Continue OAC for CHADS2VASC of 3  2.  OSA Compliant with CPAP  3.  HTN Stable No change required today  Anticipate discharge to home tomorrow   Signed, Hillis Range, MD  10/27/2017, 10:51 AM

## 2017-10-28 LAB — BASIC METABOLIC PANEL
ANION GAP: 9 (ref 5–15)
BUN: 15 mg/dL (ref 6–20)
CALCIUM: 9.3 mg/dL (ref 8.9–10.3)
CO2: 31 mmol/L (ref 22–32)
Chloride: 103 mmol/L (ref 101–111)
Creatinine, Ser: 0.89 mg/dL (ref 0.61–1.24)
GFR calc Af Amer: >60 mL/min (ref >60)
GLUCOSE: 121 mg/dL — AB (ref 65–99)
Potassium: 4.1 mmol/L (ref 3.5–5.1)
Sodium: 143 mmol/L (ref 135–145)

## 2017-10-28 LAB — MAGNESIUM: MAGNESIUM: 2.3 mg/dL (ref 1.7–2.4)

## 2017-10-28 MED ORDER — DOFETILIDE 500 MCG PO CAPS: 500.0000 ug | ORAL_CAPSULE | Freq: Two times a day (BID) | ORAL | 1 refills | Status: DC

## 2017-10-28 NOTE — Progress Notes (Signed)
Pt was placed on Cpap tolerating well no issues to report.

## 2017-10-28 NOTE — Discharge Instructions (Signed)
°  You have an appointment set up with the Atrial Fibrillation Clinic.  Multiple studies have shown that being followed by a dedicated atrial fibrillation clinic in addition to the standard care you receive from your other physicians improves health. We believe that enrollment in the atrial fibrillation clinic will allow us to better care for you.  ° °The phone number to the Atrial Fibrillation Clinic is [phone]. The clinic is staffed Monday through Friday from 8:30am to 5pm. ° °Parking Directions: The clinic is located in the Heart and Vascular Building connected to [facility]. °1)From [address] turn on to [address] and go to the 3rd entrance  (Heart and Vascular entrance) on the right. °2)Look to the right for Heart &Vascular Parking Garage. °3)A code for the entrance is required please call the clinic to receive this.   °4)Take the elevators to the 1st floor. Registration is in the room with the glass walls at the end of the hallway. ° °If you have any trouble parking or locating the clinic, please don’t hesitate to call [phone]. ° °Information on my medicine - XARELTO® (Rivaroxaban) ° °This medication education was reviewed with me or my healthcare representative as part of my discharge preparation.   ° °Why was Xarelto® prescribed for you? °Xarelto® was prescribed for you to reduce the risk of a blood clot forming that can cause a stroke if you have a medical condition called atrial fibrillation (a type of irregular heartbeat). ° °What do you need to know about xarelto® ? °Take your Xarelto® ONCE DAILY at the same time every day with your evening meal. °If you have difficulty swallowing the tablet whole, you may crush it and mix in applesauce just prior to taking your dose. ° °Take Xarelto® exactly as prescribed by your doctor and DO NOT stop taking Xarelto® without talking to the doctor who prescribed the medication.  Stopping without other stroke prevention medication to take the  place of Xarelto® may increase your risk of developing a clot that causes a stroke.  Refill your prescription before you run out. ° °After discharge, you should have regular check-up appointments with your healthcare provider that is prescribing your Xarelto®.  In the future your dose may need to be changed if your kidney function or weight changes by a significant amount. ° °What do you do if you miss a dose? °If you are taking Xarelto® ONCE DAILY and you miss a dose, take it as soon as you remember on the same day then continue your regularly scheduled once daily regimen the next day. Do not take two doses of Xarelto® at the same time or on the same day.  ° °Important Safety Information °A possible side effect of Xarelto® is bleeding. You should call your healthcare provider right away if you experience any of the following: °? Bleeding from an injury or your nose that does not stop. °? Unusual colored urine (red or dark brown) or unusual colored stools (red or black). °? Unusual bruising for unknown reasons. °? A serious fall or if you hit your head (even if there is no bleeding). ° °Some medicines may interact with Xarelto® and might increase your risk of bleeding while on Xarelto®. To help avoid this, consult your healthcare provider or pharmacist prior to using any new prescription or non-prescription medications, including herbals, vitamins, non-steroidal anti-inflammatory drugs (NSAIDs) and supplements. ° °This website has more information on Xarelto®: www.xarelto.com. ° °

## 2017-10-28 NOTE — Care Management Important Message (Signed)
Important Message  Patient Details  Name: Anthony Cole MRN: [id number] Date of Birth: 09-Dec-1950   Medicare Important Message Given:  Yes    Christine Morton P Shaw Dobek 10/28/2017, 3:42 PM

## 2017-11-04 ENCOUNTER — Encounter (HOSPITAL_COMMUNITY): Payer: Self-pay | Admitting: Nurse Practitioner

## 2017-11-04 ENCOUNTER — Ambulatory Visit (HOSPITAL_COMMUNITY)
Admission: RE | Admit: 2017-11-04 | Discharge: 2017-11-04 | Disposition: A | Discharge status: Home / Self Care | Payer: Medicare Other | Source: Ambulatory Visit | Attending: Nurse Practitioner | Admitting: Nurse Practitioner

## 2017-11-04 ENCOUNTER — Other Ambulatory Visit (HOSPITAL_COMMUNITY): Payer: Self-pay | Admitting: *Deleted

## 2017-11-04 VITALS — BP 152/94 | HR 130 | Ht 70.0 in | Wt 180.0 lb

## 2017-11-04 DIAGNOSIS — Z79899 Other long term (current) drug therapy: Secondary | ICD-10-CM | POA: Diagnosis not present

## 2017-11-04 DIAGNOSIS — E669 Obesity, unspecified: Secondary | ICD-10-CM | POA: Insufficient documentation

## 2017-11-04 DIAGNOSIS — I4891 Unspecified atrial fibrillation: Secondary | ICD-10-CM | POA: Diagnosis not present

## 2017-11-04 DIAGNOSIS — I10 Essential (primary) hypertension: Secondary | ICD-10-CM | POA: Diagnosis not present

## 2017-11-04 DIAGNOSIS — G4733 Obstructive sleep apnea (adult) (pediatric): Secondary | ICD-10-CM | POA: Diagnosis not present

## 2017-11-04 DIAGNOSIS — Z7901 Long term (current) use of anticoagulants: Secondary | ICD-10-CM | POA: Insufficient documentation

## 2017-11-04 DIAGNOSIS — Z6825 Body mass index (BMI) 25.0-25.9, adult: Secondary | ICD-10-CM | POA: Diagnosis not present

## 2017-11-04 DIAGNOSIS — E119 Type 2 diabetes mellitus without complications: Secondary | ICD-10-CM | POA: Insufficient documentation

## 2017-11-04 DIAGNOSIS — I480 Paroxysmal atrial fibrillation: Secondary | ICD-10-CM | POA: Diagnosis not present

## 2017-11-04 DIAGNOSIS — E785 Hyperlipidemia, unspecified: Secondary | ICD-10-CM | POA: Diagnosis not present

## 2017-11-04 DIAGNOSIS — E039 Hypothyroidism, unspecified: Secondary | ICD-10-CM | POA: Insufficient documentation

## 2017-11-04 DIAGNOSIS — Z888 Allergy status to other drugs, medicaments and biological substances status: Secondary | ICD-10-CM | POA: Diagnosis not present

## 2017-11-04 LAB — CBC WITH DIFFERENTIAL/PLATELET
ABS IMMATURE GRANULOCYTES: 0 10*3/uL (ref 0.0–0.1)
BASOS ABS: 0.1 10*3/uL (ref 0.0–0.1)
BASOS PCT: 1 %
Eosinophils Absolute: 0.1 10*3/uL (ref 0.0–0.7)
Eosinophils Relative: 1 %
HCT: 43.7 % (ref 39.0–52.0)
Hemoglobin: 15 g/dL (ref 13.0–17.0)
Immature Granulocytes: 0 %
Lymphocytes Relative: 21 %
Lymphs Abs: 1.7 10*3/uL (ref 0.7–4.0)
MCH: 30.1 pg (ref 26.0–34.0)
MCHC: 34.3 g/dL (ref 30.0–36.0)
MCV: 87.8 fL (ref 78.0–100.0)
MONO ABS: 0.9 10*3/uL (ref 0.1–1.0)
MONOS PCT: 11 %
Neutro Abs: 5.4 10*3/uL (ref 1.7–7.7)
Neutrophils Relative %: 66 %
Platelets: 262 10*3/uL (ref 150–400)
RBC: 4.98 MIL/uL (ref 4.22–5.81)
RDW: 12.7 % (ref 11.5–15.5)
WBC: 8.1 10*3/uL (ref 4.0–10.5)

## 2017-11-04 LAB — BASIC METABOLIC PANEL
ANION GAP: 9 (ref 5–15)
BUN: 15 mg/dL (ref 6–20)
CALCIUM: 9.4 mg/dL (ref 8.9–10.3)
CO2: 28 mmol/L (ref 22–32)
CREATININE: 0.81 mg/dL (ref 0.61–1.24)
Chloride: 103 mmol/L (ref 101–111)
GFR calc Af Amer: >60 mL/min (ref >60)
GLUCOSE: 129 mg/dL — AB (ref 65–99)
Potassium: 3.6 mmol/L (ref 3.5–5.1)
Sodium: 140 mmol/L (ref 135–145)

## 2017-11-04 LAB — MAGNESIUM: Magnesium: 2 mg/dL (ref 1.7–2.4)

## 2017-11-04 MED ORDER — POTASSIUM CHLORIDE CRYS ER 20 MEQ PO TBCR: 20.0000 meq | EXTENDED_RELEASE_TABLET | Freq: Every day | ORAL | 3 refills | Status: DC

## 2017-11-04 NOTE — Patient Instructions (Signed)
Your cardioversion is scheduled for : June 10th at 1:00 p.m. Arrive at the [facility] and go to admitting at 11:30 a.m. Do Not eat or drink anything after midnight the night prior to your procedure. Take all your medications with a sip of water prior to arrival. Do NOT miss any doses of your blood thinner. You will NOT be able to drive home after your procedure.    Follow up with Lupita Leash 1 week after

## 2017-11-04 NOTE — Addendum Note (Signed)
Encounter addended by: Newman Nip, NP on: 11/04/2017 2:14 PM  Actions taken: LOS modified

## 2017-11-04 NOTE — Progress Notes (Signed)
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 male with a h/o afib s/p ablation 08/25/17. He was being seen in the office as pt has noted return of afib since 2 days after ablation. He felt  he was tolerating his afib better since ablation. He was instructed to increase the metoprolol and his was rate controlled. He denied any swallowing difficulties and had minor bruising at the rt groin site. He was set up for successful cardioversion.   He returns to afib clinic, unfortunately has returned to afib. He believes he held in normal rhythm for about a week. No known trigger. Continues to take flecainde 100 mg bid, and xarelto, no missed doses.   F/u in afib clinic 5/10. Remains in atrial flutter at 84 bpm  He called in last week stating that the 2nd  cardioversion did not hold in SR. Discussed with Dr.Allred and he said to offer pt short term amiodarone or Tikosyn. We discussed this in clinic in detail and he believes he would rather try Tikosyn. Seems to be tolerating a flutter well.  F/u in afib clinic, 5/31. He was admitted for Tikosyn, and did convert to SR but returned to afib prior to d/c. The plan on d/c was if he continued in afib to cardiovert on tikosyn and see if he would maintain SR. Pt is in agreement. He is taking tikosyn and xareoto without missed doses.  Today, he denies symptoms of palpitations, chest pain, shortness of breath, orthopnea, PND, lower extremity edema, dizziness, presyncope, syncope, or neurologic sequela. The patient is tolerating medications without difficulties and is otherwise without complaint today.   Past Medical History:  Diagnosis Date  . Arthritis   . Atrial fibrillation (HCC)   . Diabetes mellitus without complication (HCC)    type 2  . Hyperlipidemia   . Hypertension   . Hypothyroidism   . Obesity (BMI 30-39.9) 01/25/2016  . OSA on CPAP 07/16/2014   Moderate with AHI 19/hr on CPAP at  8cm H2O  . Pre-diabetes   . Visit for monitoring Tikosyn therapy 10/25/2017   Past Surgical History:  Procedure Laterality Date  . ATRIAL FIBRILLATION ABLATION N/A 08/25/2017   Procedure: ATRIAL FIBRILLATION ABLATION;  Surgeon: Hillis Range, MD;  [facility] IN[facility];  Service: Cardiovascular;  Laterality: N/A;  . CARDIOVERSION  11/04/2011   Procedure: CARDIOVERSION;  Surgeon: Lesleigh Noe, MD;  [facility];  Service: Cardiovascular;  Laterality: N/A;  . CARDIOVERSION N/A 07/02/2013   Procedure: CARDIOVERSION;  Surgeon: Lesleigh Noe, MD;  Location[facility] ENDOSCOPY;  Service: Cardiovascular;  Laterality: N/A;  10:07 elective cardioversion Lido , Propofol ,IV...200 joules, synched, pt presently in A flutter,...cardioverted to NSR...  . CARDIOVERSION N/A 09/19/2013   Procedure: CARDIOVERSION;  Surgeon: Lesleigh Noe, MD;  Location: [facility] ENDOSCOPY;  Service: Cardiovascular;  Laterality: N/A;  . CARDIOVERSION N/A 08/13/2014   Procedure: CARDIOVERSION;  Surgeon: Lars Masson, MD;  Location: [facility] ENDOSCOPY;  Service: Cardiovascular;  Laterality: N/A;  . CARDIOVERSION N/A 05/23/2017   Procedure: CARDIOVERSION;  Surgeon: Vesta Mixer, MD;  Location: [facility] ENDOSCOPY;  Service: Cardiovascular;  Laterality: N/A;  . CARDIOVERSION N/A 09/09/2017   Procedure: CARDIOVERSION;  Surgeon: Pricilla Riffle, MD;  Location: [facility] ENDOSCOPY;  Service: Cardiovascular;  Laterality: N/A;  . CARDIOVERSION N/A 09/30/2017   Procedure: CARDIOVERSION;  Surgeon: Laqueta Linden, MD;  Location: [facility] ENDOSCOPY;  Service: Cardiovascular;  Laterality: N/A;  . EYE SURGERY    . FINGER FRACTURE SURGERY Left "when I was a kid"  . FRACTURE SURGERY    . KNEE ARTHROSCOPY W/ DEBRIDEMENT Bilateral   . RETINAL LASER PROCEDURE Bilateral    "tears on both; not detachments"  . SHOULDER ARTHROSCOPY W/ ROTATOR CUFF REPAIR Bilateral   . STAPEDES SURGERY Right   . TONSILLECTOMY    . TYMPANOPLASTY Bilateral    "skin  grafts to ear drums"    Current Outpatient Medications  Medication Sig Dispense Refill  . dofetilide (TIKOSYN) 500 MCG capsule Take 1 capsule (500 mcg total) by mouth 2 (two) times daily. 180 capsule 1  . doxycycline (VIBRA-TABS) 100 MG tablet Take 100 mg by mouth daily as needed (rosacea).     [name] levothyroxine (SYNTHROID, LEVOTHROID) 88 MCG tablet Take 88 mcg by mouth daily before breakfast.     . losartan (COZAAR) 50 MG tablet Take 1 tablet (50 mg total) by mouth daily. 30 tablet 11  . metoprolol succinate (TOPROL-XL) 25 MG 24 hr tablet Take 25 mg by mouth daily.    . Multiple Vitamins-Minerals (PRESERVISION AREDS 2+MULTI VIT PO) Take 1 capsule by mouth 2 (two) times daily.    . rosuvastatin (CRESTOR) 10 MG tablet Take 10 mg by mouth daily.     [name] 20 MG TABS tablet TAKE 1 TABLET ONCE DAILY. (Patient taking differently: TAKE 1 TABLET ONCE DAILY IN THE EVENING) 90 tablet 2  . acyclovir (ZOVIRAX) 200 MG capsule Take 200 mg by mouth 2 (two) times daily as needed (first sign of fever blister).     [name] desonide (DESOWEN) 0.05 % lotion Apply 1 application topically daily as needed (dry skin).    [name] diltiazem (CARDIZEM) 30 MG tablet Take 1 tablet every 4 hours AS NEEDED for AFIB heart rate >100 as long as top number of blood pressure >100. 45 tablet 1   Current Facility-Administered Medications  Medication Dose Route Frequency Provider Last Rate Last Dose  . 0.9 %  sodium chloride infusion  250 mL Intravenous Continuous [name], MD      . sodium chloride flush (NS) 0.9 % injection 3 mL  3 mL Intravenous Q12H [name], MD      . sodium chloride flush (NS) 0.9 % injection 3 mL  3 mL Intravenous PRN [name], MD        Allergies  Allergen Reactions  . Gabapentin Other (See Comments)    Causes Extreme sedation   . Other     Not sure of name of med-for overactive bladder-caused some adverse reactions, stopped taking immediately-per patient    Social History    Socioeconomic History  . Marital status: Single    Spouse name: Not on file  . Number of children: Not on file  . Years of education: Not on file  . Highest education level: Not on file  Occupational History  . Not on file  Social Needs  . Financial resource strain: Not on file  . Food insecurity:    Worry: Not on file    Inability: Not on file  . Transportation needs:    Medical: Not on file    Non-medical: Not on file  Tobacco Use  . Smoking status: Never Smoker  . Smokeless tobacco: Never Used  Substance and Sexual Activity  . Alcohol use: No  . Drug use: No  . Sexual activity: Not Currently  Lifestyle  . Physical activity:  Days per week: Not on file    Minutes per session: Not on file  . Stress: Not on file  Relationships  . Social connections:    Talks on phone: Not on file    Gets together: Not on file    Attends religious service: Not on file    Active member of club or organization: Not on file    Attends meetings of clubs or organizations: Not on file    Relationship status: Not on file  . Intimate partner violence:    Fear of current or ex partner: Not on file    Emotionally abused: Not on file    Physically abused: Not on file    Forced sexual activity: Not on file  Other Topics Concern  . Not on file  Social History Narrative  . Not on file    Family History  Problem Relation Age of Onset  . Hypertension Mother   . Stroke Maternal Grandmother     ROS- All systems are reviewed and negative except as per the HPI above  Physical Exam: Vitals:   11/04/17 1010  BP: (!) 152/94  Pulse: (!) 130  Weight: 180 lb (81.6 kg)  Height:  (1.778 m)   Wt Readings from Last 3 Encounters:  11/04/17 180 lb (81.6 kg)  10/28/17 179 lb 3.7 oz (81.3 kg)  10/25/17 184 lb (83.5 kg)    Labs: Lab Results  Component Value Date   NA 140 11/04/2017   K 3.6 11/04/2017   CL 103 11/04/2017   CO2 28 11/04/2017   GLUCOSE 129 (H) 11/04/2017   BUN 15  11/04/2017   CREATININE 0.81 11/04/2017   CALCIUM 9.4 11/04/2017   MG 2.3 10/28/2017   Lab Results  Component Value Date   INR 1.4 (H) 05/18/2017   No results found for: CHOL, HDL, LDLCALC, TRIG   GEN- The patient is well appearing, alert and oriented x 3 today.   Head- normocephalic, atraumatic Eyes-  Sclera clear, conjunctiva pink Ears- hearing intact Oropharynx- clear Neck- supple, no JVP Lymph- no cervical lymphadenopathy Lungs- Clear to ausculation bilaterally, normal work of breathing Heart- irregular rate and rhythm, no murmurs, rubs or gallops, PMI not laterally displaced GI- soft, NT, ND, + BS Extremities- no clubbing, cyanosis, or edema MS- no significant deformity or atrophy Skin- no rash or lesion Psych- euthymic mood, full affect Neuro- strength and sensation are intact  EKG- probable atrial flutter at 130 bpm, qrs int 82ms, qtc 479 ms ( with pulse ox several minutes of rest, showed HR in the upper 80's)    Assessment and Plan: 1.Persisitent afib/flutter s/p ablation 08/25/17  Cardioversion x 2, s/p ablation, with ERAF Then admitted for Tikosyn, initially converted but then with ERAF Continue dofetilide 500 mcg bid Continue metoprolol 25 mg qd  Will plan for cardioversion 6/10  Continue xarelto  20 mg a daily, no missed doses No benadryl Bmet/mag/cbc today   F/u in one week after cardioversion  Lupita Leash [facility] [address] [place], [place] [address] [phone]

## 2017-11-06 ENCOUNTER — Other Ambulatory Visit: Payer: Self-pay | Admitting: Interventional Cardiology

## 2017-11-07 ENCOUNTER — Other Ambulatory Visit (HOSPITAL_COMMUNITY): Payer: Self-pay | Admitting: *Deleted

## 2017-11-07 NOTE — Telephone Encounter (Signed)
Xarelto 20mg  refill request received; pt is 67 yrs old, Wt-81.9kg, Crea-0.81 on 11/04/17, last seen by Dr. Johney FrameAllred on 06/24/17, CrCl-105.32 ml/min; will send in refill to requested Pharmacy.

## 2017-11-14 ENCOUNTER — Ambulatory Visit (HOSPITAL_COMMUNITY): Admission: RE | Admit: 2017-11-14 | Payer: Medicare Other | Source: Ambulatory Visit | Admitting: Cardiovascular Disease

## 2017-11-14 ENCOUNTER — Ambulatory Visit (HOSPITAL_COMMUNITY)
Admission: RE | Admit: 2017-11-14 | Discharge: 2017-11-14 | Disposition: A | Discharge status: Home / Self Care | Payer: Medicare Other | Source: Ambulatory Visit | Attending: Nurse Practitioner | Admitting: Nurse Practitioner

## 2017-11-14 ENCOUNTER — Encounter (HOSPITAL_COMMUNITY): Admission: RE | Payer: Self-pay | Source: Ambulatory Visit

## 2017-11-14 DIAGNOSIS — Z09 Encounter for follow-up examination after completed treatment for conditions other than malignant neoplasm: Secondary | ICD-10-CM | POA: Diagnosis present

## 2017-11-14 SURGERY — CARDIOVERSION
Anesthesia: General

## 2017-11-14 NOTE — Progress Notes (Addendum)
Patient came in for EKG as he felt to be back in normal rhythm. EKG confirms return to NSR with a HR 58, Qtc 445 msec. Pt will keep scheduled follow up.  Hillis RangeJames Gayathri Futrell MD, [facility] 11/14/2017 11:58 AM

## 2017-11-14 NOTE — Progress Notes (Signed)
Pt in for EKG before dccv, feeling like he is back in normal rhythm.

## 2017-11-16 ENCOUNTER — Encounter (HOSPITAL_COMMUNITY): Payer: Medicare Other | Admitting: Nurse Practitioner

## 2017-11-21 ENCOUNTER — Ambulatory Visit
Admission: RE | Admit: 2017-11-21 | Discharge: 2017-11-21 | Disposition: A | Discharge status: Home / Self Care | Payer: Medicare Other | Source: Ambulatory Visit | Attending: Family Medicine | Admitting: Family Medicine

## 2017-11-21 DIAGNOSIS — R9389 Abnormal findings on diagnostic imaging of other specified body structures: Secondary | ICD-10-CM

## 2017-11-22 ENCOUNTER — Ambulatory Visit (HOSPITAL_COMMUNITY): Payer: Medicare Other | Admitting: Nurse Practitioner

## 2017-11-28 ENCOUNTER — Ambulatory Visit: Payer: Medicare Other | Admitting: Internal Medicine

## 2017-11-28 ENCOUNTER — Encounter: Payer: Self-pay | Admitting: Internal Medicine

## 2017-11-28 VITALS — BP 142/82 | HR 60 | Ht 70.0 in | Wt 183.0 lb

## 2017-11-28 DIAGNOSIS — I481 Persistent atrial fibrillation: Secondary | ICD-10-CM

## 2017-11-28 DIAGNOSIS — I4819 Other persistent atrial fibrillation: Secondary | ICD-10-CM

## 2017-11-28 NOTE — Progress Notes (Signed)
PCP: Daisy Florooss, Charles Alan, MD Primary Cardiologist: Dr Corinna CapraSmith  Anthony Cole is a 67 y.o. male who presents today for routine electrophysiology followup.  Since his recent afib ablation, the patient reports doing very well.  He did have ERAF and has been placed on tikosyn.  AF has improved.   he denies procedure related complications and is pleased with the results of the procedure.  Today, he denies symptoms of palpitations, chest pain, shortness of breath,  lower extremity edema, dizziness, presyncope, or syncope.  The patient is otherwise without complaint today.   Past Medical History:  Diagnosis Date  . Arthritis   . Atrial fibrillation (HCC)   . Diabetes mellitus without complication (HCC)    type 2  . Hyperlipidemia   . Hypertension   . Hypothyroidism   . Obesity (BMI 30-39.9) 01/25/2016  . OSA on CPAP 07/16/2014   Moderate with AHI 19/hr on CPAP at 8cm H2O  . Pre-diabetes   . Visit for monitoring Tikosyn therapy 10/25/2017   Past Surgical History:  Procedure Laterality Date  . ATRIAL FIBRILLATION ABLATION N/A 08/25/2017   Procedure: ATRIAL FIBRILLATION ABLATION;  Surgeon: Hillis RangeAllred, Suhaan Perleberg, MD;  Location: [facility]AS[facility] LAB;  Service: Cardiovascular;  Laterality: N/A;  . CARDIOVERSION  11/04/2011   Procedure: CARDIOVERSION;  Surgeon: Lesleigh NoeHenry W Smith III, MD;  [facility];  Service: Cardiovascular;  Laterality: N/A;  . CARDIOVERSION N/A 07/02/2013   Procedure: CARDIOVERSION;  Surgeon: Lesleigh NoeHenry W Smith III, MD;  [facility];  Service: Cardiovascular;  Laterality: N/A;  10:07 elective cardioversion Lido 40mg , Propofol 60mg ,IV...200 joules, synched, pt presently in A flutter,...cardioverted to NSR...  . CARDIOVERSION N/A 09/19/2013   Procedure: CARDIOVERSION;  Surgeon: Lesleigh NoeHenry W Smith III, MD;  [facility];  Service: Cardiovascular;  Laterality: N/A;  . CARDIOVERSION N/A 08/13/2014   Procedure: CARDIOVERSION;  Surgeon: Lars MassonKatarina H Nelson, MD;  [facility];   Service: Cardiovascular;  Laterality: N/A;  . CARDIOVERSION N/A 05/23/2017   Procedure: CARDIOVERSION;  Surgeon: Vesta MixerNahser, Philip J, MD;  [facility];  Service: Cardiovascular;  Laterality: N/A;  . CARDIOVERSION N/A 09/09/2017   Procedure: CARDIOVERSION;  Surgeon: Pricilla Riffleoss, Paula V, MD;  [facility];  Service: Cardiovascular;  Laterality: N/A;  . CARDIOVERSION N/A 09/30/2017   Procedure: CARDIOVERSION;  Surgeon: Laqueta LindenKoneswaran, Suresh A, MD;  [facility];  Service: Cardiovascular;  Laterality: N/A;  . EYE SURGERY    . FINGER FRACTURE SURGERY Left "when I was a kid"  . FRACTURE SURGERY    . KNEE ARTHROSCOPY W/ DEBRIDEMENT Bilateral   . RETINAL LASER PROCEDURE Bilateral    "tears on both; not detachments"  . SHOULDER ARTHROSCOPY W/ ROTATOR CUFF REPAIR Bilateral   . STAPEDES SURGERY Right   . TONSILLECTOMY    . TYMPANOPLASTY Bilateral    "skin grafts to ear drums"    ROS- all systems are personally reviewed and negatives except as per HPI above  Current Outpatient Medications  Medication Sig Dispense Refill  . acyclovir (ZOVIRAX) 200 MG capsule Take 200 mg by mouth 2 (two) times daily as needed (first sign of fever blister).     [name]. desonide (DESOWEN) 0.05 % lotion Apply 1 application topically daily as needed (dry skin).    [name]. diltiazem (CARDIZEM) 30 MG tablet Take 1 tablet every 4 hours AS NEEDED for AFIB heart rate >100 as long as top number of blood pressure >100. 45 tablet 1  . dofetilide (TIKOSYN) 500 MCG capsule Take 1 capsule (500 mcg  total) by mouth 2 (two) times daily. 180 capsule 1  . doxycycline (VIBRA-TABS) 100 MG tablet Take 100 mg by mouth daily as needed (rosacea).     [name] levothyroxine (SYNTHROID, LEVOTHROID) 88 MCG tablet Take 88 mcg by mouth daily before breakfast.     . losartan (COZAAR) 50 MG tablet Take 1 tablet (50 mg total) by mouth daily. 30 tablet 11  . metoprolol succinate (TOPROL-XL) 25 MG 24 hr tablet Take 25 mg by mouth daily.    . Multiple  Vitamins-Minerals (PRESERVISION AREDS 2+MULTI VIT PO) Take 1 capsule by mouth 2 (two) times daily.    . potassium chloride SA (K-DUR,KLOR-CON) 20 MEQ tablet Take 1 tablet (20 mEq total) by mouth daily. 30 tablet 3  . rivaroxaban (XARELTO) 20 MG TABS tablet TAKE 1 TABLET ONCE DAILY IN THE EVENING 90 tablet 0  . rosuvastatin (CRESTOR) 10 MG tablet Take 10 mg by mouth daily.      Current Facility-Administered Medications  Medication Dose Route Frequency Provider Last Rate Last Dose  . 0.9 %  sodium chloride infusion  250 mL Intravenous Continuous [name], MD      . sodium chloride flush (NS) 0.9 % injection 3 mL  3 mL Intravenous Q12H [name], MD      . sodium chloride flush (NS) 0.9 % injection 3 mL  3 mL Intravenous PRN [name], MD        Physical Exam: Vitals:   11/28/17 1132  BP: (!) 142/82  Pulse: 60  Weight: 183 lb (83 kg)  Height: 5\' 10"  (1.778 m)    GEN- The patient is well appearing, alert and oriented x 3 today.   Head- normocephalic, atraumatic Eyes-  Sclera clear, conjunctiva pink Ears- hearing intact Oropharynx- clear Lungs- Clear to ausculation bilaterally, normal work of breathing Heart- Regular rate and rhythm, no murmurs, rubs or gallops, PMI not laterally displaced GI- soft, NT, ND, + BS Extremities- no clubbing, cyanosis, or edema  EKG tracing ordered today is personally reviewed and shows sinus rhythm with PACs, Qtc 434 msec, nonspecific St/ T changes unchanged from prior ekgs  Assessment and Plan:  1. Persistent  atrial fibrillation Doing well s/p ablation He has had ERAF for which he is on amiodarone chads2vasc score is 3.  Continue anticoagulation  2. OSA Compliant with CPAP  Return to see me in 3 months  Hillis Range MD, [facility] 11/28/2017 12:03 PM

## 2017-11-28 NOTE — Patient Instructions (Signed)
Medication Instructions:  Your physician recommends that you continue on your current medications as directed. Please refer to the Current Medication list given to you today.  Labwork: None ordered     *We will only notify you of abnormal results, otherwise continue current treatment plan.  Testing/Procedures: None ordered  Follow-Up: Your physician recommends that you schedule a follow-up appointment in: 3 months with Dr. Johney FrameAllred.   * If you need a refill on your cardiac medications before your next appointment, please call your pharmacy.   *Please note that any paperwork needing to be filled out by the provider will need to be addressed at the front desk prior to seeing the provider. Please note that any FMLA, disability or other documents regarding health condition is subject to a $25.00 charge that must be received prior to completion of paperwork in the form of a money order or check.  Thank you for choosing [facility]!!   S

## 2017-12-13 NOTE — Progress Notes (Signed)
Cardiology Office Note    Date:  12/14/2017   ID:  Anthony Cole, DOB 1951/03/25, MRN [id number]  PCP:  Daisy Floro, MD  Cardiologist: Lesleigh Noe, MD   No chief complaint on file.   History of Present Illness:  Anthony Cole is a 67 y.o. male who presents for obstructive sleep apnea, paroxysmal atrial fibrillation, hypertension, hyperlipidemia, CAD with coronary calcium score 250, and diabetes mellitus without complications   Doing relatively well.  Worried about a lot of things.  Had early recurrence of atrial fibrillation after ablation.  Now on Tikosyn therapy for rhythm control.  Denies chest pain.  Has had some musculoskeletal discomfort.  Recent lipid panel by Dr. Tenny Craw revealed an LDL of 110.  Coronary calcification noted on CT scan done as part of staging before ablation.  Coronary artery calcium score is greater than 250.   Past Medical History:  Diagnosis Date  . Arthritis   . Atrial fibrillation (HCC)   . Diabetes mellitus without complication (HCC)    type 2  . Hyperlipidemia   . Hypertension   . Hypothyroidism   . Obesity (BMI 30-39.9) 01/25/2016  . OSA on CPAP 07/16/2014   Moderate with AHI 19/hr on CPAP at 8cm H2O  . Pre-diabetes   . Visit for monitoring Tikosyn therapy 10/25/2017    Past Surgical History:  Procedure Laterality Date  . ATRIAL FIBRILLATION ABLATION N/A 08/25/2017   Procedure: ATRIAL FIBRILLATION ABLATION;  Surgeon: Hillis Range, MD;  [facility];  Service: Cardiovascular;  Laterality: N/A;  . CARDIOVERSION  11/04/2011   Procedure: CARDIOVERSION;  Surgeon: Lesleigh Noe, MD;  Location: [facility] OR;  Service: Cardiovascular;  Laterality: N/A;  . CARDIOVERSION N/A 07/02/2013   Procedure: CARDIOVERSION;  Surgeon: Lesleigh Noe, MD;  Location: [facility] ENDOSCOPY;  Service: Cardiovascular;  Laterality: N/A;  10:07 elective cardioversion Lido 40mg , Propofol 60mg ,IV...200 joules, synched, pt presently in A  flutter,...cardioverted to NSR...  . CARDIOVERSION N/A 09/19/2013   Procedure: CARDIOVERSION;  Surgeon: Lesleigh Noe, MD;  Location: [facility] ENDOSCOPY;  Service: Cardiovascular;  Laterality: N/A;  . CARDIOVERSION N/A 08/13/2014   Procedure: CARDIOVERSION;  Surgeon: Lars Masson, MD;  Location: [facility] ENDOSCOPY;  Service: Cardiovascular;  Laterality: N/A;  . CARDIOVERSION N/A 05/23/2017   Procedure: CARDIOVERSION;  Surgeon: Vesta Mixer, MD;  Location: [facility] ENDOSCOPY;  Service: Cardiovascular;  Laterality: N/A;  . CARDIOVERSION N/A 09/09/2017   Procedure: CARDIOVERSION;  Surgeon: Pricilla Riffle, MD;  Location: [facility] ENDOSCOPY;  Service: Cardiovascular;  Laterality: N/A;  . CARDIOVERSION N/A 09/30/2017   Procedure: CARDIOVERSION;  Surgeon: Laqueta Linden, MD;  Location: [facility] ENDOSCOPY;  Service: Cardiovascular;  Laterality: N/A;  . EYE SURGERY    . FINGER FRACTURE SURGERY Left "when I was a kid"  . FRACTURE SURGERY    . KNEE ARTHROSCOPY W/ DEBRIDEMENT Bilateral   . RETINAL LASER PROCEDURE Bilateral    "tears on both; not detachments"  . SHOULDER ARTHROSCOPY W/ ROTATOR CUFF REPAIR Bilateral   . STAPEDES SURGERY Right   . TONSILLECTOMY    . TYMPANOPLASTY Bilateral    "skin grafts to ear drums"    Current Medications: Outpatient Medications Prior to Visit  Medication Sig Dispense Refill  . acyclovir (ZOVIRAX) 200 MG capsule Take 200 mg by mouth 2 (two) times daily as needed (first sign of fever blister).     [name] desonide (DESOWEN) 0.05 % lotion Apply 1 application topically daily as needed (dry  skin).    . diltiazem (CARDIZEM) 30 MG tablet Take 1 tablet every 4 hours AS NEEDED for AFIB heart rate >100 as long as top number of blood pressure >100. 45 tablet 1  . dofetilide (TIKOSYN) 500 MCG capsule Take 1 capsule (500 mcg total) by mouth 2 (two) times daily. 180 capsule 1  . doxycycline (VIBRA-TABS) 100 MG tablet Take 100 mg by mouth daily as needed (rosacea).     [name] levothyroxine (SYNTHROID,  LEVOTHROID) 88 MCG tablet Take 88 mcg by mouth daily before breakfast.     . losartan (COZAAR) 50 MG tablet Take 1 tablet (50 mg total) by mouth daily. 30 tablet 11  . metoprolol succinate (TOPROL-XL) 25 MG 24 hr tablet Take 25 mg by mouth daily.    . Multiple Vitamins-Minerals (PRESERVISION AREDS 2+MULTI VIT PO) Take 1 capsule by mouth 2 (two) times daily.    . potassium chloride SA (K-DUR,KLOR-CON) 20 MEQ tablet Take 1 tablet (20 mEq total) by mouth daily. 30 tablet 3  . rivaroxaban (XARELTO) 20 MG TABS tablet TAKE 1 TABLET ONCE DAILY IN THE EVENING 90 tablet 0  . rosuvastatin (CRESTOR) 10 MG tablet Take 10 mg by mouth daily.      Facility-Administered Medications Prior to Visit  Medication Dose Route Frequency Provider Last Rate Last Dose  . 0.9 %  sodium chloride infusion  250 mL Intravenous Continuous [name], MD      . sodium chloride flush (NS) 0.9 % injection 3 mL  3 mL Intravenous Q12H [name], MD      . sodium chloride flush (NS) 0.9 % injection 3 mL  3 mL Intravenous PRN [name], MD         Allergies:   Gabapentin and Other   Social History   Socioeconomic History  . Marital status: Single    Spouse name: Not on file  . Number of children: Not on file  . Years of education: Not on file  . Highest education level: Not on file  Occupational History  . Not on file  Social Needs  . Financial resource strain: Not on file  . Food insecurity:    Worry: Not on file    Inability: Not on file  . Transportation needs:    Medical: Not on file    Non-medical: Not on file  Tobacco Use  . Smoking status: Never Smoker  . Smokeless tobacco: Never Used  Substance and Sexual Activity  . Alcohol use: No  . Drug use: No  . Sexual activity: Not Currently  Lifestyle  . Physical activity:    Days per week: Not on file    Minutes per session: Not on file  . Stress: Not on file  Relationships  . Social connections:    Talks on phone: Not on file    Gets  together: Not on file    Attends religious service: Not on file    Active member of club or organization: Not on file    Attends meetings of clubs or organizations: Not on file    Relationship status: Not on file  Other Topics Concern  . Not on file  Social History Narrative  . Not on file     Family History:  The patient's family history includes Hypertension in his mother; Stroke in his maternal grandmother.   ROS:   Please see the history of present illness.    Anxiety All other systems reviewed and are negative.  PHYSICAL EXAM:   VS:  BP (!) 146/88   Pulse 63   Ht 5\' 9"  (1.753 m)   Wt 183 lb (83 kg)   BMI 27.02 kg/m    GEN: Well nourished, well developed, in no acute distress  HEENT: normal  Neck: no JVD, carotid bruits, or masses Cardiac: RRR; no murmurs, rubs, or gallops,no edema  Respiratory:  clear to auscultation bilaterally, normal work of breathing GI: soft, nontender, nondistended, + BS MS: no deformity or atrophy  Skin: warm and dry, no rash Neuro:  Alert and Oriented x 3, Strength and sensation are intact Psych: euthymic mood, full affect  Wt Readings from Last 3 Encounters:  12/14/17 183 lb (83 kg)  11/28/17 183 lb (83 kg)  11/04/17 180 lb (81.6 kg)      Studies/Labs Reviewed:   EKG:  EKG not repeated  Recent Labs: 09/22/2017: TSH 4.183 11/04/2017: BUN 15; Creatinine, Ser 0.81; Hemoglobin 15.0; Magnesium 2.0; Platelets 262; Potassium 3.6; Sodium 140   Lipid Panel No results found for: CHOL, TRIG, HDL, CHOLHDL, VLDL, LDLCALC, LDLDIRECT  Additional studies/ records that were reviewed today include:  No new data other than CT as noted below: 08/19/2017 IMPRESSION: 1. There is normal pulmonary vein drainage into the left atrium.   2. The left atrial appendage is large - mixed chicken wing / broccoli type with two major lobes. There is no thrombus in the left atrial appendage.   3. The esophagus runs to the left from the left atrial midline  and is in the proximity to the LLPV.   4. Normal coronary origin. left dominance. The study was performed without use of NTG and insufficient for plaque evaluation. Calcium score is 247 that represents 71 percentile for age/sex.   5. Mildly dilated pulmonary artery measuring 31 mm.    ASSESSMENT:    1. Paroxysmal atrial fibrillation (HCC)   2. Long term current use of anticoagulant therapy   3. Essential hypertension   4. Visit for monitoring Tikosyn therapy   5. Coronary artery calcification seen on CAT scan   6. Hyperlipidemia with target LDL less than 70      PLAN:  In order of problems listed above:  1. Early recurrence of atrial fibrillation after ablation.  Now on Tikosyn and asymptomatic. 2. No bleeding complications. 3. I repeated the blood pressure and it is 128/78 mmHg.  Target is 130/80 mmHg. 4. Tolerating dofetilide without complications. 5. Aggressive risk modification: LDL less than 70, blood pressure 130/80 mmHg or less, globin at A1c less than 7 (most recently 6.3), aerobic activity, and weight loss are recommended. 6. Increase Crestor to 70 mg daily.  Fasting lipid panel and liver panel in 6 weeks.  Clinical follow-up in 6 months.    Medication Adjustments/Labs and Tests Ordered: Current medicines are reviewed at length with the patient today.  Concerns regarding medicines are outlined above.  Medication changes, Labs and Tests ordered today are listed in the Patient Instructions below. Patient Instructions  Medication Instructions:  1) INCREASE Rosuvastatin to 20mg  once daily.   Labwork: Your physician recommends that you return for lab work in: 6 weeks (Lipid, liver)  You need to be fasting for these labs (nothing to eat or drink after midnight the night before).   Testing/Procedures: None  Follow-Up: Your physician wants you to follow-up in: 9 months with Dr. Katrinka BlazingSmith.  You will receive a reminder letter in the mail two months in advance. If you don't  receive a letter, please  call our office to schedule the follow-up appointment.   Any Other Special Instructions Will Be Listed Below (If Applicable).     If you need a refill on your cardiac medications before your next appointment, please call your pharmacy.      Signed, Lesleigh Noe, MD  12/14/2017 10:42 AM    [facility] [address] [place], [place], [place]  [address] Phone: [phone]; Fax: [phone]

## 2017-12-14 ENCOUNTER — Ambulatory Visit: Payer: Medicare Other | Admitting: Interventional Cardiology

## 2017-12-14 ENCOUNTER — Encounter: Payer: Self-pay | Admitting: Interventional Cardiology

## 2017-12-14 VITALS — BP 146/88 | HR 63 | Ht 69.0 in | Wt 183.0 lb

## 2017-12-14 DIAGNOSIS — Z7901 Long term (current) use of anticoagulants: Secondary | ICD-10-CM | POA: Diagnosis not present

## 2017-12-14 DIAGNOSIS — I2510 Atherosclerotic heart disease of native coronary artery without angina pectoris: Secondary | ICD-10-CM

## 2017-12-14 DIAGNOSIS — Z79899 Other long term (current) drug therapy: Secondary | ICD-10-CM | POA: Diagnosis not present

## 2017-12-14 DIAGNOSIS — Z5181 Encounter for therapeutic drug level monitoring: Secondary | ICD-10-CM

## 2017-12-14 DIAGNOSIS — E785 Hyperlipidemia, unspecified: Secondary | ICD-10-CM

## 2017-12-14 DIAGNOSIS — I10 Essential (primary) hypertension: Secondary | ICD-10-CM | POA: Diagnosis not present

## 2017-12-14 DIAGNOSIS — I480 Paroxysmal atrial fibrillation: Secondary | ICD-10-CM | POA: Diagnosis not present

## 2017-12-14 MED ORDER — ROSUVASTATIN CALCIUM 20 MG PO TABS: 20.0000 mg | ORAL_TABLET | Freq: Every day | ORAL | 3 refills | Status: DC

## 2017-12-14 NOTE — Patient Instructions (Addendum)
Medication Instructions:  1) INCREASE Rosuvastatin to 20mg  once daily.   Labwork: Your physician recommends that you return for lab work in: 6 weeks (Lipid, liver)  You need to be fasting for these labs (nothing to eat or drink after midnight the night before).   Testing/Procedures: None  Follow-Up: Your physician wants you to follow-up in: 9 months with Dr. Katrinka BlazingSmith.  You will receive a reminder letter in the mail two months in advance. If you don't receive a letter, please call our office to schedule the follow-up appointment.   Any Other Special Instructions Will Be Listed Below (If Applicable).     If you need a refill on your cardiac medications before your next appointment, please call your pharmacy.

## 2017-12-21 ENCOUNTER — Other Ambulatory Visit: Payer: Self-pay | Admitting: Family Medicine

## 2017-12-21 DIAGNOSIS — R109 Unspecified abdominal pain: Secondary | ICD-10-CM

## 2017-12-28 ENCOUNTER — Ambulatory Visit
Admission: RE | Admit: 2017-12-28 | Discharge: 2017-12-28 | Disposition: A | Discharge status: Home / Self Care | Payer: Medicare Other | Source: Ambulatory Visit | Attending: Family Medicine | Admitting: Family Medicine

## 2017-12-28 DIAGNOSIS — R109 Unspecified abdominal pain: Secondary | ICD-10-CM

## 2017-12-29 ENCOUNTER — Ambulatory Visit
Admission: RE | Admit: 2017-12-29 | Discharge: 2017-12-29 | Disposition: A | Discharge status: Home / Self Care | Payer: Medicare Other | Source: Ambulatory Visit | Attending: Family Medicine | Admitting: Family Medicine

## 2017-12-29 ENCOUNTER — Other Ambulatory Visit: Payer: Self-pay | Admitting: Family Medicine

## 2017-12-29 DIAGNOSIS — R935 Abnormal findings on diagnostic imaging of other abdominal regions, including retroperitoneum: Secondary | ICD-10-CM

## 2017-12-29 MED ORDER — IOPAMIDOL (ISOVUE-300) INJECTION 61%
100.0000 mL | Freq: Once | INTRAVENOUS | Status: AC | PRN
  Administered 2017-12-29: 100 mL via INTRAVENOUS

## 2017-12-30 ENCOUNTER — Other Ambulatory Visit: Payer: Medicare Other

## 2018-01-26 ENCOUNTER — Other Ambulatory Visit: Payer: Medicare Other

## 2018-01-30 ENCOUNTER — Other Ambulatory Visit: Payer: Medicare Other

## 2018-02-01 ENCOUNTER — Other Ambulatory Visit: Payer: Medicare Other | Admitting: *Deleted

## 2018-02-01 DIAGNOSIS — I2510 Atherosclerotic heart disease of native coronary artery without angina pectoris: Secondary | ICD-10-CM

## 2018-02-01 LAB — HEPATIC FUNCTION PANEL
ALBUMIN: 4.5 g/dL (ref 3.6–4.8)
ALT: 33 IU/L (ref 0–44)
AST: 30 IU/L (ref 0–40)
Alkaline Phosphatase: 61 IU/L (ref 39–117)
BILIRUBIN TOTAL: 0.8 mg/dL (ref 0.0–1.2)
Bilirubin, Direct: 0.19 mg/dL (ref 0.00–0.40)
Total Protein: 6.7 g/dL (ref 6.0–8.5)

## 2018-02-01 LAB — LIPID PANEL
CHOLESTEROL TOTAL: 139 mg/dL (ref 100–199)
Chol/HDL Ratio: 3.2 ratio (ref 0.0–5.0)
HDL: 43 mg/dL (ref >39)
LDL Calculated: 77 mg/dL (ref 0–99)
Triglycerides: 94 mg/dL (ref 0–149)
VLDL Cholesterol Cal: 19 mg/dL (ref 5–40)

## 2018-02-04 ENCOUNTER — Other Ambulatory Visit: Payer: Self-pay | Admitting: Interventional Cardiology

## 2018-02-07 NOTE — Telephone Encounter (Signed)
Xarelto 20mg  refill request received; pt is 67 yrs old, wt-83kg, Crea-0.81 on 11/04/17, CrCl-105.75ml/min, and last seen by Dr. Katrinka Blazing on 12/14/17; will send in refill to requested pharmacy.

## 2018-02-09 ENCOUNTER — Other Ambulatory Visit: Payer: Medicare Other

## 2018-02-10 ENCOUNTER — Telehealth (HOSPITAL_COMMUNITY): Payer: Self-pay | Admitting: *Deleted

## 2018-02-10 NOTE — Telephone Encounter (Signed)
Patient called in stating he feels he has been back in AF for the last week or so. Fatigue and palpations. He states his ENT listened to his heart and said it was irregular. HR is controlled patient states. Will bring in for appt early next week.

## 2018-02-14 ENCOUNTER — Other Ambulatory Visit (HOSPITAL_COMMUNITY): Payer: Self-pay | Admitting: *Deleted

## 2018-02-14 ENCOUNTER — Encounter (HOSPITAL_COMMUNITY): Payer: Self-pay | Admitting: Nurse Practitioner

## 2018-02-14 ENCOUNTER — Ambulatory Visit (HOSPITAL_COMMUNITY)
Admission: RE | Admit: 2018-02-14 | Discharge: 2018-02-14 | Disposition: A | Discharge status: Home / Self Care | Payer: Medicare Other | Source: Ambulatory Visit | Attending: Nurse Practitioner | Admitting: Nurse Practitioner

## 2018-02-14 VITALS — BP 152/86 | HR 109 | Ht 69.0 in | Wt 177.0 lb

## 2018-02-14 DIAGNOSIS — Z9889 Other specified postprocedural states: Secondary | ICD-10-CM | POA: Diagnosis not present

## 2018-02-14 DIAGNOSIS — Z79899 Other long term (current) drug therapy: Secondary | ICD-10-CM | POA: Insufficient documentation

## 2018-02-14 DIAGNOSIS — E669 Obesity, unspecified: Secondary | ICD-10-CM | POA: Diagnosis not present

## 2018-02-14 DIAGNOSIS — I4892 Unspecified atrial flutter: Secondary | ICD-10-CM | POA: Diagnosis not present

## 2018-02-14 DIAGNOSIS — G4733 Obstructive sleep apnea (adult) (pediatric): Secondary | ICD-10-CM | POA: Diagnosis not present

## 2018-02-14 DIAGNOSIS — E119 Type 2 diabetes mellitus without complications: Secondary | ICD-10-CM | POA: Insufficient documentation

## 2018-02-14 DIAGNOSIS — E039 Hypothyroidism, unspecified: Secondary | ICD-10-CM | POA: Diagnosis not present

## 2018-02-14 DIAGNOSIS — Z79890 Hormone replacement therapy (postmenopausal): Secondary | ICD-10-CM | POA: Diagnosis not present

## 2018-02-14 DIAGNOSIS — Z6830 Body mass index (BMI) 30.0-30.9, adult: Secondary | ICD-10-CM | POA: Insufficient documentation

## 2018-02-14 DIAGNOSIS — M1990 Unspecified osteoarthritis, unspecified site: Secondary | ICD-10-CM | POA: Insufficient documentation

## 2018-02-14 DIAGNOSIS — E785 Hyperlipidemia, unspecified: Secondary | ICD-10-CM | POA: Insufficient documentation

## 2018-02-14 DIAGNOSIS — Z7901 Long term (current) use of anticoagulants: Secondary | ICD-10-CM | POA: Insufficient documentation

## 2018-02-14 DIAGNOSIS — I481 Persistent atrial fibrillation: Secondary | ICD-10-CM | POA: Diagnosis not present

## 2018-02-14 DIAGNOSIS — Z8249 Family history of ischemic heart disease and other diseases of the circulatory system: Secondary | ICD-10-CM | POA: Diagnosis not present

## 2018-02-14 DIAGNOSIS — I4891 Unspecified atrial fibrillation: Secondary | ICD-10-CM | POA: Diagnosis present

## 2018-02-14 DIAGNOSIS — I10 Essential (primary) hypertension: Secondary | ICD-10-CM | POA: Insufficient documentation

## 2018-02-14 DIAGNOSIS — Z888 Allergy status to other drugs, medicaments and biological substances status: Secondary | ICD-10-CM | POA: Insufficient documentation

## 2018-02-14 DIAGNOSIS — I4819 Other persistent atrial fibrillation: Secondary | ICD-10-CM

## 2018-02-14 LAB — BASIC METABOLIC PANEL
ANION GAP: 10 (ref 5–15)
BUN: 14 mg/dL (ref 8–23)
CALCIUM: 9.4 mg/dL (ref 8.9–10.3)
CO2: 28 mmol/L (ref 22–32)
CREATININE: 0.79 mg/dL (ref 0.61–1.24)
Chloride: 102 mmol/L (ref 98–111)
Glucose, Bld: 138 mg/dL — ABNORMAL HIGH (ref 70–99)
Potassium: 3.8 mmol/L (ref 3.5–5.1)
SODIUM: 140 mmol/L (ref 135–145)

## 2018-02-14 LAB — CBC
HCT: 41.6 % (ref 39.0–52.0)
Hemoglobin: 14.3 g/dL (ref 13.0–17.0)
MCH: 31.4 pg (ref 26.0–34.0)
MCHC: 34.4 g/dL (ref 30.0–36.0)
MCV: 91.2 fL (ref 78.0–100.0)
PLATELETS: 238 10*3/uL (ref 150–400)
RBC: 4.56 MIL/uL (ref 4.22–5.81)
RDW: 12.5 % (ref 11.5–15.5)
WBC: 7.8 10*3/uL (ref 4.0–10.5)

## 2018-02-14 LAB — MAGNESIUM: MAGNESIUM: 2.3 mg/dL (ref 1.7–2.4)

## 2018-02-14 MED ORDER — POTASSIUM CHLORIDE CRYS ER 20 MEQ PO TBCR: 20.0000 meq | EXTENDED_RELEASE_TABLET | Freq: Two times a day (BID) | ORAL | 6 refills | Status: DC

## 2018-02-14 NOTE — Progress Notes (Signed)
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 male with a h/o afib s/p ablation 08/25/17. He was being seen in the office as pt has noted return of afib since 2 days after ablation. He felt  he was tolerating his afib better since ablation. He was instructed to increase the metoprolol and his was rate controlled. He denied any swallowing difficulties and had minor bruising at the rt groin site. He was set up for successful cardioversion.   He returns to afib clinic, unfortunately has returned to afib. He believes he held in normal rhythm for about a week. No known trigger. Continues to take flecainde 100 mg bid, and xarelto, no missed doses.   F/u in afib clinic 5/10. Remains in atrial flutter at 84 bpm  He called in last week stating that the 2nd  cardioversion did not hold in SR. Discussed with Dr.Allred and he said to offer pt short term amiodarone or Tikosyn. We discussed this in clinic in detail and he believes he would rather try Tikosyn. Seems to be tolerating a flutter well.  F/u in afib clinic, 5/31. He was admitted for Tikosyn, and did convert to SR but returned to afib prior to d/c. The plan on d/c was if he continued in afib to cardiovert on tikosyn and see if he would maintain SR. Pt is in agreement. He is taking tikosyn and xareoto without missed doses.  F/u in afib clinic 02/14/18. He was in a MD office recently and irregular HB was noted. He is found to be in afib today. He has noticed some palpitations at times. He is tired when he is in afib, and tired when he is in SR.  Options discussed to restore SR.     Today, he denies symptoms of palpitations, chest pain, shortness of breath, orthopnea, PND, lower extremity edema, dizziness, presyncope, syncope, or neurologic sequela.+ fatigue. The patient is tolerating medications without difficulties and is otherwise without complaint today.   Past Medical History:    Diagnosis Date  . Arthritis   . Atrial fibrillation (HCC)   . Diabetes mellitus without complication (HCC)    type 2  . Hyperlipidemia   . Hypertension   . Hypothyroidism   . Obesity (BMI 30-39.9) 01/25/2016  . OSA on CPAP 07/16/2014   Moderate with AHI 19/hr on CPAP at 8cm H2O  . Pre-diabetes   . Visit for monitoring Tikosyn therapy 10/25/2017   Past Surgical History:  Procedure Laterality Date  . ATRIAL FIBRILLATION ABLATION N/A 08/25/2017   Procedure: ATRIAL FIBRILLATION ABLATION;  Surgeon: Hillis Range, MD;  Location: [facility] INVASIVE CV LAB;  Service: Cardiovascular;  Laterality: N/A;  . CARDIOVERSION  11/04/2011   Procedure: CARDIOVERSION;  Surgeon: Lesleigh Noe, MD;  Location: [facility] OR;  Service: Cardiovascular;  Laterality: N/A;  . CARDIOVERSION N/A 07/02/2013   Procedure: CARDIOVERSION;  Surgeon: Lesleigh Noe, MD;  Location: [facility] ENDOSCOPY;  Service: Cardiovascular;  Laterality: N/A;  10:07 elective cardioversion Lido 40mg , Propofol 60mg ,IV...200 joules, synched, pt presently in A flutter,...cardioverted to NSR...  . CARDIOVERSION N/A 09/19/2013   Procedure: CARDIOVERSION;  Surgeon: Lesleigh Noe, MD;  Location: [facility] ENDOSCOPY;  Service: Cardiovascular;  Laterality: N/A;  . CARDIOVERSION N/A 08/13/2014   Procedure: CARDIOVERSION;  Surgeon: Lars Masson, MD;  Location: [facility] ENDOSCOPY;  Service: Cardiovascular;  Laterality: N/A;  . CARDIOVERSION N/A 05/23/2017   Procedure: CARDIOVERSION;  Surgeon: Vesta Mixer,  MD;  Location: [facility] ENDOSCOPY;  Service: Cardiovascular;  Laterality: N/A;  . CARDIOVERSION N/A 09/09/2017   Procedure: CARDIOVERSION;  Surgeon: Pricilla Riffle, MD;  Location: [facility] ENDOSCOPY;  Service: Cardiovascular;  Laterality: N/A;  . CARDIOVERSION N/A 09/30/2017   Procedure: CARDIOVERSION;  Surgeon: Laqueta Linden, MD;  Location: [facility] ENDOSCOPY;  Service: Cardiovascular;  Laterality: N/A;  . EYE SURGERY    . FINGER FRACTURE SURGERY Left "when I was a kid"  .  FRACTURE SURGERY    . KNEE ARTHROSCOPY W/ DEBRIDEMENT Bilateral   . RETINAL LASER PROCEDURE Bilateral    "tears on both; not detachments"  . SHOULDER ARTHROSCOPY W/ ROTATOR CUFF REPAIR Bilateral   . STAPEDES SURGERY Right   . TONSILLECTOMY    . TYMPANOPLASTY Bilateral    "skin grafts to ear drums"    Current Outpatient Medications  Medication Sig Dispense Refill  . acyclovir (ZOVIRAX) 200 MG capsule Take 200 mg by mouth 2 (two) times daily as needed (first sign of fever blister).     [name] desonide (DESOWEN) 0.05 % lotion Apply 1 application topically daily as needed (dry skin).    [name] diltiazem (CARDIZEM) 30 MG tablet Take 1 tablet every 4 hours AS NEEDED for AFIB heart rate >100 as long as top number of blood pressure >100. 45 tablet 1  . dofetilide (TIKOSYN) 500 MCG capsule Take 1 capsule (500 mcg total) by mouth 2 (two) times daily. 180 capsule 1  . doxycycline (VIBRA-TABS) 100 MG tablet Take 100 mg by mouth daily as needed (rosacea).     [name] levothyroxine (SYNTHROID, LEVOTHROID) 88 MCG tablet Take 88 mcg by mouth daily before breakfast.     . losartan (COZAAR) 50 MG tablet Take 1 tablet (50 mg total) by mouth daily. 30 tablet 11  . metoprolol succinate (TOPROL-XL) 25 MG 24 hr tablet Take 25 mg by mouth daily.    . Multiple Vitamins-Minerals (PRESERVISION AREDS 2+MULTI VIT PO) Take 1 capsule by mouth 2 (two) times daily.    . potassium chloride SA (K-DUR,KLOR-CON) 20 MEQ tablet Take 1 tablet (20 mEq total) by mouth daily. 30 tablet 3  . rosuvastatin (CRESTOR) 20 MG tablet Take 1 tablet (20 mg total) by mouth daily. 90 tablet 3  . XARELTO 20 MG TABS tablet TAKE 1 TABLET ONCE DAILY IN THE EVENING. 90 tablet 2   Current Facility-Administered Medications  Medication Dose Route Frequency Provider Last Rate Last Dose  . 0.9 %  sodium chloride infusion  250 mL Intravenous Continuous [name], MD      . sodium chloride flush (NS) 0.9 % injection 3 mL  3 mL Intravenous Q12H [name], MD       . sodium chloride flush (NS) 0.9 % injection 3 mL  3 mL Intravenous PRN [name], MD        Allergies  Allergen Reactions  . Gabapentin Other (See Comments)    Causes Extreme sedation   . Other     Not sure of name of med-for overactive bladder-caused some adverse reactions, stopped taking immediately-per patient    Social History   Socioeconomic History  . Marital status: Single    Spouse name: Not on file  . Number of children: Not on file  . Years of education: Not on file  . Highest education level: Not on file  Occupational History  . Not on file  Social Needs  . Financial resource strain: Not on file  . Food insecurity:  Worry: Not on file    Inability: Not on file  . Transportation needs:    Medical: Not on file    Non-medical: Not on file  Tobacco Use  . Smoking status: Never Smoker  . Smokeless tobacco: Never Used  Substance and Sexual Activity  . Alcohol use: No  . Drug use: No  . Sexual activity: Not Currently  Lifestyle  . Physical activity:    Days per week: Not on file    Minutes per session: Not on file  . Stress: Not on file  Relationships  . Social connections:    Talks on phone: Not on file    Gets together: Not on file    Attends religious service: Not on file    Active member of club or organization: Not on file    Attends meetings of clubs or organizations: Not on file    Relationship status: Not on file  . Intimate partner violence:    Fear of current or ex partner: Not on file    Emotionally abused: Not on file    Physically abused: Not on file    Forced sexual activity: Not on file  Other Topics Concern  . Not on file  Social History Narrative  . Not on file    Family History  Problem Relation Age of Onset  . Hypertension Mother   . Stroke Maternal Grandmother     ROS- All systems are reviewed and negative except as per the HPI above  Physical Exam: Vitals:   02/14/18 1331  BP: (!) 152/86  Pulse: (!) 109    Weight: 80.3 kg  Height: 5\' 9"  (1.753 m)   Wt Readings from Last 3 Encounters:  02/14/18 80.3 kg  12/14/17 83 kg  11/28/17 83 kg    Labs: Lab Results  Component Value Date   NA 140 11/04/2017   K 3.6 11/04/2017   CL 103 11/04/2017   CO2 28 11/04/2017   GLUCOSE 129 (H) 11/04/2017   BUN 15 11/04/2017   CREATININE 0.81 11/04/2017   CALCIUM 9.4 11/04/2017   MG 2.0 11/04/2017   Lab Results  Component Value Date   INR 1.4 (H) 05/18/2017   Lab Results  Component Value Date   CHOL 139 02/01/2018   HDL 43 02/01/2018   LDLCALC 77 02/01/2018   TRIG 94 02/01/2018     GEN- The patient is well appearing, alert and oriented x 3 today.   Head- normocephalic, atraumatic Eyes-  Sclera clear, conjunctiva pink Ears- hearing intact Oropharynx- clear Neck- supple, no JVP Lymph- no cervical lymphadenopathy Lungs- Clear to ausculation bilaterally, normal work of breathing Heart- irregular rate and rhythm, no murmurs, rubs or gallops, PMI not laterally displaced GI- soft, NT, ND, + BS Extremities- no clubbing, cyanosis, or edema MS- no significant deformity or atrophy Skin- no rash or lesion Psych- euthymic mood, full affect Neuro- strength and sensation are intact  EKG- atrial fib at 130 bpm, qrs int 82ms, qtc 479 ms ( with pulse ox several minutes of rest, showed HR in the upper 80's)    Assessment and Plan: 1.Persisitent afib/flutter s/p ablation 08/25/17  Cardioversion x 2, s/p ablation, with ERAF Then admitted for Tikosyn, initially converted but then with ERAF After cardioversion has maintained SR until 1-2 weeks States being compliant  dofetilide 500 mcg bid, continue drug Continue metoprolol 25 mg qd  Pt would like to pursue cardioversion, vrs move up appointment with Dr. Johney Frame and discuss redo ablation Continue xarelto  20 mg a daily,states no missed doses for at least 3 weeks Bmet/mag/cbc today   F/u up with Dr.Allred as scheduled 10/2   Lupita Leash [facility] [address] [place], [place] [address] [phone]

## 2018-02-14 NOTE — Patient Instructions (Signed)
Cardioversion scheduled for Wednesday, September 18th  - Arrive at the [facility] and go to admitting at 12:30PM  -Do not eat or drink anything after midnight the night prior to your procedure.  - Take all your morning medication with a sip of water prior to arrival.  - You will not be able to drive home after your procedure.

## 2018-02-16 ENCOUNTER — Telehealth: Payer: Self-pay | Admitting: *Deleted

## 2018-02-16 NOTE — Telephone Encounter (Signed)
Patient has a 1 week follow up appointment scheduled for 11/20 2019.  Pt is aware and agreeable to treatment.

## 2018-02-22 ENCOUNTER — Encounter (HOSPITAL_COMMUNITY): Payer: Self-pay | Admitting: Certified Registered"

## 2018-02-22 ENCOUNTER — Ambulatory Visit (HOSPITAL_COMMUNITY)
Admission: RE | Admit: 2018-02-22 | Discharge: 2018-02-22 | Disposition: A | Discharge status: Home / Self Care | Payer: Medicare Other | Source: Ambulatory Visit | Attending: Cardiology | Admitting: Cardiology

## 2018-02-22 ENCOUNTER — Encounter (HOSPITAL_COMMUNITY): Payer: Self-pay | Admitting: *Deleted

## 2018-02-22 ENCOUNTER — Encounter (HOSPITAL_COMMUNITY)
Admission: RE | Disposition: A | Discharge status: Home / Self Care | Payer: Self-pay | Source: Ambulatory Visit | Attending: Cardiology

## 2018-02-22 DIAGNOSIS — I4891 Unspecified atrial fibrillation: Secondary | ICD-10-CM | POA: Diagnosis present

## 2018-02-22 DIAGNOSIS — Z5309 Procedure and treatment not carried out because of other contraindication: Secondary | ICD-10-CM | POA: Diagnosis not present

## 2018-02-22 DIAGNOSIS — R001 Bradycardia, unspecified: Secondary | ICD-10-CM | POA: Diagnosis not present

## 2018-02-22 SURGERY — CANCELLED PROCEDURE

## 2018-02-22 NOTE — Progress Notes (Signed)
Patient admitted to Endoscopy for CV. Patient placed on monitor and appeared to be in sinus brady. EKG and MD confirmed sinus bradycardia. Patient discharged to home prior to entering the procedure room.

## 2018-03-02 ENCOUNTER — Other Ambulatory Visit: Payer: Self-pay | Admitting: Nurse Practitioner

## 2018-03-08 ENCOUNTER — Ambulatory Visit: Payer: Medicare Other | Admitting: Internal Medicine

## 2018-03-08 ENCOUNTER — Encounter: Payer: Self-pay | Admitting: Internal Medicine

## 2018-03-08 VITALS — BP 140/76 | HR 56 | Ht 69.0 in | Wt 179.8 lb

## 2018-03-08 DIAGNOSIS — I4819 Other persistent atrial fibrillation: Secondary | ICD-10-CM

## 2018-03-08 DIAGNOSIS — G4733 Obstructive sleep apnea (adult) (pediatric): Secondary | ICD-10-CM | POA: Diagnosis not present

## 2018-03-08 MED ORDER — DOFETILIDE 500 MCG PO CAPS: 500.0000 ug | ORAL_CAPSULE | Freq: Two times a day (BID) | ORAL | 3 refills | Status: DC

## 2018-03-08 NOTE — Patient Instructions (Addendum)
Medication Instructions:  Your physician recommends that you continue on your current medications as directed. Please refer to the Current Medication list given to you today.  Labwork: None ordered.  Testing/Procedures: None ordered.  Follow-Up: Your physician wants you to follow-up in: 3 months with Donna Carroll NP at the afib clinic.      Any Other Special Instructions Will Be Listed Below (If Applicable).  If you need a refill on your cardiac medications before your next appointment, please call your pharmacy.   

## 2018-03-08 NOTE — Progress Notes (Signed)
PCP: Daisy Floro, MD Primary Cardiologist: Dr Katrinka Blazing Primary EP: Dr Johney Frame  Anthony Cole is a 67 y.o. male who presents today for routine electrophysiology followup.  Since last being seen in our clinic, the patient reports doing very well.  He did have afib recently for which he was seen in the AF clinic.  Today, he denies symptoms of palpitations, chest pain, shortness of breath,  lower extremity edema, dizziness, presyncope, or syncope.  The patient is otherwise without complaint today.   Past Medical History:  Diagnosis Date  . Arthritis   . Atrial fibrillation (HCC)   . Diabetes mellitus without complication (HCC)    type 2  . Hyperlipidemia   . Hypertension   . Hypothyroidism   . Obesity (BMI 30-39.9) 01/25/2016  . OSA on CPAP 07/16/2014   Moderate with AHI 19/hr on CPAP at 8cm H2O  . Pre-diabetes   . Visit for monitoring Tikosyn therapy 10/25/2017   Past Surgical History:  Procedure Laterality Date  . ATRIAL FIBRILLATION ABLATION N/A 08/25/2017   Procedure: ATRIAL FIBRILLATION ABLATION;  Surgeon: Hillis Range, MD;  [facility];  Service: Cardiovascular;  Laterality: N/A;  . CARDIOVERSION  11/04/2011   Procedure: CARDIOVERSION;  Surgeon: Lesleigh Noe, MD;  Location: [facility] OR;  Service: Cardiovascular;  Laterality: N/A;  . CARDIOVERSION N/A 07/02/2013   Procedure: CARDIOVERSION;  Surgeon: Lesleigh Noe, MD;  Location: [facility] ENDOSCOPY;  Service: Cardiovascular;  Laterality: N/A;  10:07 elective cardioversion Lido 40mg , Propofol 60mg ,IV...200 joules, synched, pt presently in A flutter,...cardioverted to NSR...  . CARDIOVERSION N/A 09/19/2013   Procedure: CARDIOVERSION;  Surgeon: Lesleigh Noe, MD;  Location: [facility] ENDOSCOPY;  Service: Cardiovascular;  Laterality: N/A;  . CARDIOVERSION N/A 08/13/2014   Procedure: CARDIOVERSION;  Surgeon: Lars Masson, MD;  Location: [facility] ENDOSCOPY;  Service: Cardiovascular;  Laterality: N/A;  . CARDIOVERSION N/A  05/23/2017   Procedure: CARDIOVERSION;  Surgeon: Vesta Mixer, MD;  Location: [facility] ENDOSCOPY;  Service: Cardiovascular;  Laterality: N/A;  . CARDIOVERSION N/A 09/09/2017   Procedure: CARDIOVERSION;  Surgeon: Pricilla Riffle, MD;  Location: [facility] ENDOSCOPY;  Service: Cardiovascular;  Laterality: N/A;  . CARDIOVERSION N/A 09/30/2017   Procedure: CARDIOVERSION;  Surgeon: Laqueta Linden, MD;  Location: [facility] ENDOSCOPY;  Service: Cardiovascular;  Laterality: N/A;  . EYE SURGERY    . FINGER FRACTURE SURGERY Left "when I was a kid"  . FRACTURE SURGERY    . KNEE ARTHROSCOPY W/ DEBRIDEMENT Bilateral   . RETINAL LASER PROCEDURE Bilateral    "tears on both; not detachments"  . SHOULDER ARTHROSCOPY W/ ROTATOR CUFF REPAIR Bilateral   . STAPEDES SURGERY Right   . TONSILLECTOMY    . TYMPANOPLASTY Bilateral    "skin grafts to ear drums"    ROS- all systems are reviewed and negatives except as per HPI above  Current Outpatient Medications  Medication Sig Dispense Refill  . acyclovir (ZOVIRAX) 200 MG capsule Take 200 mg by mouth 2 (two) times daily as needed (first sign of fever blister).     [name] desonide (DESOWEN) 0.05 % lotion Apply 1 application topically daily as needed (dry skin).    [name] diltiazem (CARDIZEM) 30 MG tablet Take 1 tablet every 4 hours AS NEEDED for AFIB heart rate >100 as long as top number of blood pressure >100. 45 tablet 1  . doxycycline (VIBRA-TABS) 100 MG tablet Take 100 mg by mouth daily.     [name] levothyroxine (SYNTHROID, LEVOTHROID) 88 MCG  tablet Take 88 mcg by mouth daily before breakfast.     . losartan (COZAAR) 50 MG tablet Take 1 tablet (50 mg total) by mouth daily. 30 tablet 11  . metoprolol succinate (TOPROL-XL) 25 MG 24 hr tablet Take 25 mg by mouth daily.    . Multiple Vitamins-Minerals (PRESERVISION AREDS 2+MULTI VIT PO) Take 1 capsule by mouth 2 (two) times daily.    . potassium chloride SA (K-DUR,KLOR-CON) 20 MEQ tablet Take 1 tablet (20 mEq total) by mouth 2 (two) times  daily. 60 tablet 6  . rosuvastatin (CRESTOR) 20 MG tablet Take 1 tablet (20 mg total) by mouth daily. 90 tablet 3  . TIKOSYN 500 MCG capsule TAKE (1) CAPSULE TWICE DAILY. 180 capsule 2  . XARELTO 20 MG TABS tablet TAKE 1 TABLET ONCE DAILY IN THE EVENING. (Patient taking differently: Take 20 mg by mouth daily with supper. ) 90 tablet 2   Current Facility-Administered Medications  Medication Dose Route Frequency Provider Last Rate Last Dose  . 0.9 %  sodium chloride infusion  250 mL Intravenous Continuous [name], MD      . sodium chloride flush (NS) 0.9 % injection 3 mL  3 mL Intravenous Q12H [name], MD      . sodium chloride flush (NS) 0.9 % injection 3 mL  3 mL Intravenous PRN [name], MD        Physical Exam: Vitals:   03/08/18 1009  BP: 140/76  Pulse: (!) 56  SpO2: 96%  Weight: 179 lb 12.8 oz (81.6 kg)  Height: 5\' 9"  (1.753 m)    GEN- The patient is well appearing, alert and oriented x 3 today.   Head- normocephalic, atraumatic Eyes-  Sclera clear, conjunctiva pink Ears- hearing intact Oropharynx- clear Lungs- Clear to ausculation bilaterally, normal work of breathing Heart- Regular rate and rhythm, no murmurs, rubs or gallops, PMI not laterally displaced GI- soft, NT, ND, + BS Extremities- no clubbing, cyanosis, or edema  Wt Readings from Last 3 Encounters:  03/08/18 179 lb 12.8 oz (81.6 kg)  02/14/18 177 lb (80.3 kg)  12/14/17 183 lb (83 kg)    EKG tracing ordered today is personally reviewed and shows sinus rhythm 56 bpm, PR 164 msec, QRS 78 msec, QTc 411 msec, nonspecific ST/T changes  Assessment and Plan:  1. Persistent afib Doing well post ablation.  He has had some afib despite medical therapy with tikosyn chads2vasc score is 3.  Continue xarelto  Therapeutic strategies for afib including medicine and ablation were discussed in detail with the patient today. Risk, benefits, and alternatives to repeat EP study and radiofrequency ablation  for afib were also discussed in detail today. These risks include but are not limited to stroke, bleeding, vascular damage, tamponade, perforation, damage to the esophagus, lungs, and other structures, pulmonary vein stenosis, worsening renal function, and death. The patient understands these risk and wishes to think about this further.  He will contact our office if he decides to proceed.  He would require cardiac CT prior to ablation.  Carto, ICE, and anesthesia would also be required.  2. OSA Compliant with CPAP  Return in 3 to AF clinic.  He will contact my office if he decides to proceed with ablation in the interim.  Hillis Range MD, [facility] 03/08/2018 10:28 AM

## 2018-04-09 ENCOUNTER — Other Ambulatory Visit: Payer: Self-pay | Admitting: Interventional Cardiology

## 2018-04-26 ENCOUNTER — Ambulatory Visit: Payer: Medicare Other | Admitting: Cardiology

## 2018-04-26 VITALS — BP 148/90 | HR 68 | Ht 69.0 in | Wt 183.0 lb

## 2018-04-26 DIAGNOSIS — I10 Essential (primary) hypertension: Secondary | ICD-10-CM

## 2018-04-26 DIAGNOSIS — G4733 Obstructive sleep apnea (adult) (pediatric): Secondary | ICD-10-CM

## 2018-04-26 DIAGNOSIS — E669 Obesity, unspecified: Secondary | ICD-10-CM | POA: Diagnosis not present

## 2018-04-26 NOTE — Progress Notes (Signed)
Cardiology Office Note:    Date:  04/26/2018   ID:  Anthony Cole, DOB 10/12/1950, MRN [id number]  PCP:  Daisy Floro, MD  Cardiologist:  No primary care provider on file.    Referring MD: Daisy Floro, MD   Chief Complaint  Patient presents with  . Sleep Apnea  . Hypertension    History of Present Illness:    Anthony Cole is a 67 y.o. male with a hx of moderate OSA with an AHI of 19 events per hour. He is on CPAP at 8cm H2O.  He is doing well with his CPAP device.   He tolerates the mask and feels the pressure is adequate.  Since going on CPAP He feels rested in the am and has no significant daytime sleepiness.  He denies any significant mouth or nasal dryness or nasal congestion.  He does not think that he snores.     Past Medical History:  Diagnosis Date  . Arthritis   . Atrial fibrillation (HCC)   . Diabetes mellitus without complication (HCC)    type 2  . Hyperlipidemia   . Hypertension   . Hypothyroidism   . Obesity (BMI 30-39.9) 01/25/2016  . OSA on CPAP 07/16/2014   Moderate with AHI 19/hr on CPAP at 8cm H2O  . Pre-diabetes   . Visit for monitoring Tikosyn therapy 10/25/2017    Past Surgical History:  Procedure Laterality Date  . ATRIAL FIBRILLATION ABLATION N/A 08/25/2017   Procedure: ATRIAL FIBRILLATION ABLATION;  Surgeon: Hillis Range, MD;  Location: [facility] LAB;  Service: Cardiovascular;  Laterality: N/A;  . CARDIOVERSION  11/04/2011   Procedure: CARDIOVERSION;  Surgeon: Lesleigh Noe, MD;  Location: [facility] OR;  Service: Cardiovascular;  Laterality: N/A;  . CARDIOVERSION N/A 07/02/2013   Procedure: CARDIOVERSION;  Surgeon: Lesleigh Noe, MD;  Location: [facility] ENDOSCOPY;  Service: Cardiovascular;  Laterality: N/A;  10:07 elective cardioversion Lido 40mg , Propofol 60mg ,IV...200 joules, synched, pt presently in A flutter,...cardioverted to NSR...  . CARDIOVERSION N/A 09/19/2013   Procedure: CARDIOVERSION;  Surgeon: Lesleigh Noe, MD;   Location: [facility] ENDOSCOPY;  Service: Cardiovascular;  Laterality: N/A;  . CARDIOVERSION N/A 08/13/2014   Procedure: CARDIOVERSION;  Surgeon: Lars Masson, MD;  Location: [facility] ENDOSCOPY;  Service: Cardiovascular;  Laterality: N/A;  . CARDIOVERSION N/A 05/23/2017   Procedure: CARDIOVERSION;  Surgeon: Vesta Mixer, MD;  Location: [facility] ENDOSCOPY;  Service: Cardiovascular;  Laterality: N/A;  . CARDIOVERSION N/A 09/09/2017   Procedure: CARDIOVERSION;  Surgeon: Pricilla Riffle, MD;  Location: [facility] ENDOSCOPY;  Service: Cardiovascular;  Laterality: N/A;  . CARDIOVERSION N/A 09/30/2017   Procedure: CARDIOVERSION;  Surgeon: Laqueta Linden, MD;  Location: [facility] ENDOSCOPY;  Service: Cardiovascular;  Laterality: N/A;  . EYE SURGERY    . FINGER FRACTURE SURGERY Left "when I was a kid"  . FRACTURE SURGERY    . KNEE ARTHROSCOPY W/ DEBRIDEMENT Bilateral   . RETINAL LASER PROCEDURE Bilateral    "tears on both; not detachments"  . SHOULDER ARTHROSCOPY W/ ROTATOR CUFF REPAIR Bilateral   . STAPEDES SURGERY Right   . TONSILLECTOMY    . TYMPANOPLASTY Bilateral    "skin grafts to ear drums"    Current Medications: No outpatient medications have been marked as taking for the 04/26/18 encounter (Office Visit) with Quintella Reichert, MD.   Current Facility-Administered Medications for the 04/26/18 encounter (Office Visit) with Quintella Reichert, MD  Medication  . 0.9 %  sodium chloride infusion  .  sodium chloride flush (NS) 0.9 % injection 3 mL  . sodium chloride flush (NS) 0.9 % injection 3 mL     Allergies:   Gabapentin and Other   Social History   Socioeconomic History  . Marital status: Single    Spouse name: Not on file  . Number of children: Not on file  . Years of education: Not on file  . Highest education level: Not on file  Occupational History  . Not on file  Social Needs  . Financial resource strain: Not on file  . Food insecurity:    Worry: Not on file    Inability: Not on file  .  Transportation needs:    Medical: Not on file    Non-medical: Not on file  Tobacco Use  . Smoking status: Never Smoker  . Smokeless tobacco: Never Used  Substance and Sexual Activity  . Alcohol use: No  . Drug use: No  . Sexual activity: Not Currently  Lifestyle  . Physical activity:    Days per week: Not on file    Minutes per session: Not on file  . Stress: Not on file  Relationships  . Social connections:    Talks on phone: Not on file    Gets together: Not on file    Attends religious service: Not on file    Active member of club or organization: Not on file    Attends meetings of clubs or organizations: Not on file    Relationship status: Not on file  Other Topics Concern  . Not on file  Social History Narrative  . Not on file     Family History: The patient's family history includes Hypertension in his mother; Stroke in his maternal grandmother.  ROS:   Please see the history of present illness.    ROS  All other systems reviewed and negative.   EKGs/Labs/Other Studies Reviewed:    The following studies were reviewed today: PAP download  EKG:  EKG is not ordered today.    Recent Labs: 09/22/2017: TSH 4.183 02/01/2018: ALT 33 02/14/2018: BUN 14; Creatinine, Ser 0.79; Hemoglobin 14.3; Magnesium 2.3; Platelets 238; Potassium 3.8; Sodium 140   Recent Lipid Panel    Component Value Date/Time   CHOL 139 02/01/2018 0828   TRIG 94 02/01/2018 0828   HDL 43 02/01/2018 0828   CHOLHDL 3.2 02/01/2018 0828   LDLCALC 77 02/01/2018 0828    Physical Exam:    VS:  There were no vitals taken for this visit.    Wt Readings from Last 3 Encounters:  03/08/18 179 lb 12.8 oz (81.6 kg)  02/14/18 177 lb (80.3 kg)  12/14/17 183 lb (83 kg)     GEN:  Well nourished, well developed in no acute distress HEENT: Normal NECK: No JVD; No carotid bruits LYMPHATICS: No lymphadenopathy CARDIAC: RRR, no murmurs, rubs, gallops RESPIRATORY:  Clear to auscultation without rales,  wheezing or rhonchi  ABDOMEN: Soft, non-tender, non-distended MUSCULOSKELETAL:  No edema; No deformity  SKIN: Warm and dry NEUROLOGIC:  Alert and oriented x 3 PSYCHIATRIC:  Normal affect   ASSESSMENT:    1. OSA (obstructive sleep apnea)   2. Essential hypertension   3. Obesity (BMI 30-39.9)    PLAN:    In order of problems listed above:  1.  OSA - the patient is tolerating PAP therapy well without any problems. The PAP download was reviewed today and showed an AHI of 4.3/hr on 9 cm H2O with 100% compliance in using  more than 4 hours nightly.  The patient has been using and benefiting from PAP use and will continue to benefit from therapy.   2.  HTN -BP is controlled on exam today.  He will continue on losartan 50 mg daily and Toprol XL 25 mg daily.  3. Obesity - I have encouraged him to get into a routine exercise program and cut back on carbs and portions.    Medication Adjustments/Labs and Tests Ordered: Current medicines are reviewed at length with the patient today.  Concerns regarding medicines are outlined above.  No orders of the defined types were placed in this encounter.  No orders of the defined types were placed in this encounter.   Signed, Armanda Magicraci Abdalrahman Clementson, MD  04/26/2018 2:09 PM    [facility]

## 2018-04-26 NOTE — Patient Instructions (Signed)

## 2018-06-14 ENCOUNTER — Ambulatory Visit (HOSPITAL_COMMUNITY)
Admission: RE | Admit: 2018-06-14 | Discharge: 2018-06-14 | Disposition: A | Discharge status: Home / Self Care | Payer: Medicare Other | Source: Ambulatory Visit | Attending: Nurse Practitioner | Admitting: Nurse Practitioner

## 2018-06-14 ENCOUNTER — Encounter (HOSPITAL_COMMUNITY): Payer: Self-pay | Admitting: Nurse Practitioner

## 2018-06-14 VITALS — BP 132/78 | HR 58 | Ht 69.0 in | Wt 185.0 lb

## 2018-06-14 DIAGNOSIS — Z79890 Hormone replacement therapy (postmenopausal): Secondary | ICD-10-CM | POA: Diagnosis not present

## 2018-06-14 DIAGNOSIS — Z888 Allergy status to other drugs, medicaments and biological substances status: Secondary | ICD-10-CM | POA: Insufficient documentation

## 2018-06-14 DIAGNOSIS — I4819 Other persistent atrial fibrillation: Secondary | ICD-10-CM | POA: Diagnosis present

## 2018-06-14 DIAGNOSIS — G4733 Obstructive sleep apnea (adult) (pediatric): Secondary | ICD-10-CM | POA: Insufficient documentation

## 2018-06-14 DIAGNOSIS — I4892 Unspecified atrial flutter: Secondary | ICD-10-CM | POA: Insufficient documentation

## 2018-06-14 DIAGNOSIS — Z8249 Family history of ischemic heart disease and other diseases of the circulatory system: Secondary | ICD-10-CM | POA: Insufficient documentation

## 2018-06-14 DIAGNOSIS — I480 Paroxysmal atrial fibrillation: Secondary | ICD-10-CM

## 2018-06-14 DIAGNOSIS — Z79899 Other long term (current) drug therapy: Secondary | ICD-10-CM | POA: Diagnosis not present

## 2018-06-14 DIAGNOSIS — Z9989 Dependence on other enabling machines and devices: Secondary | ICD-10-CM | POA: Diagnosis not present

## 2018-06-14 DIAGNOSIS — I10 Essential (primary) hypertension: Secondary | ICD-10-CM | POA: Insufficient documentation

## 2018-06-14 DIAGNOSIS — R9431 Abnormal electrocardiogram [ECG] [EKG]: Secondary | ICD-10-CM | POA: Insufficient documentation

## 2018-06-14 DIAGNOSIS — E119 Type 2 diabetes mellitus without complications: Secondary | ICD-10-CM | POA: Diagnosis not present

## 2018-06-14 DIAGNOSIS — Z7901 Long term (current) use of anticoagulants: Secondary | ICD-10-CM | POA: Diagnosis not present

## 2018-06-14 DIAGNOSIS — R001 Bradycardia, unspecified: Secondary | ICD-10-CM | POA: Insufficient documentation

## 2018-06-14 DIAGNOSIS — E039 Hypothyroidism, unspecified: Secondary | ICD-10-CM | POA: Insufficient documentation

## 2018-06-14 LAB — BASIC METABOLIC PANEL
ANION GAP: 6 (ref 5–15)
BUN: 15 mg/dL (ref 8–23)
CO2: 27 mmol/L (ref 22–32)
Calcium: 9.2 mg/dL (ref 8.9–10.3)
Chloride: 104 mmol/L (ref 98–111)
Creatinine, Ser: 0.86 mg/dL (ref 0.61–1.24)
GLUCOSE: 146 mg/dL — AB (ref 70–99)
POTASSIUM: 4.3 mmol/L (ref 3.5–5.1)
Sodium: 137 mmol/L (ref 135–145)

## 2018-06-14 LAB — MAGNESIUM: Magnesium: 2.1 mg/dL (ref 1.7–2.4)

## 2018-06-14 NOTE — Progress Notes (Addendum)
Primary Care Physician: Daisy Florooss, Charles Alan, MD Referring Physician: Dr. Wyatt PortelaAllred   Anthony Cole is a 68 y.o. male with a h/o afib s/p ablation 08/25/17. He was being seen in the office as pt has noted return of afib since 2 days after ablation. He felt  he was tolerating his afib better since ablation. He was instructed to increase the metoprolol and his was rate controlled. He denied any swallowing difficulties and had minor bruising at the rt groin site. He was set up for successful cardioversion.   He returns to afib clinic, unfortunately has returned to afib. He believes he held in normal rhythm for about a week. No known trigger. Continues to take flecainde 100 mg bid, and xarelto, no missed doses.   F/u in afib clinic 5/10. Remains in atrial flutter at 84 bpm  He called in last week stating that the 2nd  cardioversion did not hold in SR. Discussed with Dr.Allred and he said to offer pt short term amiodarone or Tikosyn. We discussed this in clinic in detail and he believes he would rather try Tikosyn. Seems to be tolerating a flutter well.  F/u in afib clinic, 5/31. He was admitted for Tikosyn, and did convert to SR but returned to afib prior to d/c. The plan on d/c was if he continued in afib to cardiovert on tikosyn and see if he would maintain SR. Pt is in agreement. He is taking tikosyn and xareoto without missed doses.  F/u in afib clinic 02/14/18. He was in a MD office recently and irregular HB was noted. He is found to be in afib today. He has noticed some palpitations at times. He is tired when he is in afib, and tired when he is in SR.  Options discussed to restore SR.    F/u in afib clinic 06/14/2018. He is having some afib but overall is feeling weill, exercising and carrying on with life. He has been offered another ablation with Dr.Allred but does not know if he is ready for this. He continues on [place]ikosyn.   Today, he denies symptoms of palpitations, chest pain, shortness of  breath, orthopnea, PND, lower extremity edema, dizziness, presyncope, syncope, or neurologic sequela.+ fatigue. The patient is tolerating medications without difficulties and is otherwise without complaint today.   Past Medical History:  Diagnosis Date  . Arthritis   . Atrial fibrillation (HCC)   . Diabetes mellitus without complication (HCC)    type 2  . Hyperlipidemia   . Hypertension   . Hypothyroidism   . Obesity (BMI 30-39.9) 01/25/2016  . OSA on CPAP 07/16/2014   Moderate with AHI 19/hr on CPAP at 8cm H2O  . Pre-diabetes   . Visit for monitoring Tikosyn therapy 10/25/2017   Past Surgical History:  Procedure Laterality Date  . ATRIAL FIBRILLATION ABLATION N/A 08/25/2017   Procedure: ATRIAL FIBRILLATION ABLATION;  Surgeon: Hillis RangeAllred, James, MD;  Location: [facility];  Service: Cardiovascular;  Laterality: N/A;  . CARDIOVERSION  11/04/2011   Procedure: CARDIOVERSION;  Surgeon: Lesleigh NoeHenry W Smith III, MD;  [facility];  Service: Cardiovascular;  Laterality: N/A;  . CARDIOVERSION N/A 07/02/2013   Procedure: CARDIOVERSION;  Surgeon: Lesleigh NoeHenry W Smith III, MD;  [facility];  Service: Cardiovascular;  Laterality: N/A;  10:07 elective cardioversion Lido 40mg , Propofol 60mg ,IV...200 joules, synched, pt presently in A flutter,...cardioverted to NSR...  . CARDIOVERSION N/A 09/19/2013   Procedure: CARDIOVERSION;  Surgeon: Lesleigh NoeHenry W Smith III, MD;  [facility];  Service:  Cardiovascular;  Laterality: N/A;  . CARDIOVERSION N/A 08/13/2014   Procedure: CARDIOVERSION;  Surgeon: Lars MassonKatarina H Nelson, MD;  [facility];  Service: Cardiovascular;  Laterality: N/A;  . CARDIOVERSION N/A 05/23/2017   Procedure: CARDIOVERSION;  Surgeon: Vesta MixerNahser, Philip J, MD;  [facility];  Service: Cardiovascular;  Laterality: N/A;  . CARDIOVERSION N/A 09/09/2017   Procedure: CARDIOVERSION;  Surgeon: Pricilla Riffleoss, Paula V, MD;  [facility];  Service: Cardiovascular;  Laterality: N/A;  .  CARDIOVERSION N/A 09/30/2017   Procedure: CARDIOVERSION;  Surgeon: Laqueta LindenKoneswaran, Suresh A, MD;  [facility];  Service: Cardiovascular;  Laterality: N/A;  . EYE SURGERY    . FINGER FRACTURE SURGERY Left "when I was a kid"  . FRACTURE SURGERY    . KNEE ARTHROSCOPY W/ DEBRIDEMENT Bilateral   . RETINAL LASER PROCEDURE Bilateral    "tears on both; not detachments"  . SHOULDER ARTHROSCOPY W/ ROTATOR CUFF REPAIR Bilateral   . STAPEDES SURGERY Right   . TONSILLECTOMY    . TYMPANOPLASTY Bilateral    "skin grafts to ear drums"    Current Outpatient Medications  Medication Sig Dispense Refill  . dofetilide (TIKOSYN) 500 MCG capsule Take 1 capsule (500 mcg total) by mouth 2 (two) times daily. 180 capsule 3  . doxycycline (VIBRA-TABS) 100 MG tablet Take 100 mg by mouth daily.     [name]. levothyroxine (SYNTHROID, LEVOTHROID) 88 MCG tablet Take 88 mcg by mouth daily before breakfast.     . losartan (COZAAR) 50 MG tablet TAKE 1 TABLET ONCE DAILY. 90 tablet 3  . metoprolol succinate (TOPROL-XL) 25 MG 24 hr tablet Take 25 mg by mouth daily.    . Multiple Vitamins-Minerals (PRESERVISION AREDS 2+MULTI VIT PO) Take 1 capsule by mouth 2 (two) times daily.    . potassium chloride SA (K-DUR,KLOR-CON) 20 MEQ tablet Take 1 tablet (20 mEq total) by mouth 2 (two) times daily. 60 tablet 6  . rosuvastatin (CRESTOR) 20 MG tablet Take 1 tablet (20 mg total) by mouth daily. 90 tablet 3  . XARELTO 20 MG TABS tablet TAKE 1 TABLET ONCE DAILY IN THE EVENING. (Patient taking differently: Take 20 mg by mouth daily with supper. ) 90 tablet 2  . acyclovir (ZOVIRAX) 200 MG capsule Take 200 mg by mouth 2 (two) times daily as needed (first sign of fever blister).     [name]. desonide (DESOWEN) 0.05 % lotion Apply 1 application topically daily as needed (dry skin).    [name]. diltiazem (CARDIZEM) 30 MG tablet Take 1 tablet every 4 hours AS NEEDED for AFIB heart rate >100 as long as top number of blood pressure >100. (Patient not taking:  Reported on 06/14/2018) 45 tablet 1   Current Facility-Administered Medications  Medication Dose Route Frequency Provider Last Rate Last Dose  . 0.9 %  sodium chloride infusion  250 mL Intravenous Continuous Lyn RecordsSmith, Henry W, MD      . sodium chloride flush (NS) 0.9 % injection 3 mL  3 mL Intravenous Q12H Lyn RecordsSmith, Henry W, MD      . sodium chloride flush (NS) 0.9 % injection 3 mL  3 mL Intravenous PRN Lyn RecordsSmith, Henry W, MD        Allergies  Allergen Reactions  . Gabapentin Other (See Comments)    Causes Extreme sedation   . Other     Not sure of name of med-for overactive bladder-caused some adverse reactions, stopped taking immediately-per patient    Social History   Socioeconomic History  . Marital status: Single  Spouse name: Not on file  . Number of children: Not on file  . Years of education: Not on file  . Highest education level: Not on file  Occupational History  . Not on file  Social Needs  . Financial resource strain: Not on file  . Food insecurity:    Worry: Not on file    Inability: Not on file  . Transportation needs:    Medical: Not on file    Non-medical: Not on file  Tobacco Use  . Smoking status: Never Smoker  . Smokeless tobacco: Never Used  Substance and Sexual Activity  . Alcohol use: No  . Drug use: No  . Sexual activity: Not Currently  Lifestyle  . Physical activity:    Days per week: Not on file    Minutes per session: Not on file  . Stress: Not on file  Relationships  . Social connections:    Talks on phone: Not on file    Gets together: Not on file    Attends religious service: Not on file    Active member of club or organization: Not on file    Attends meetings of clubs or organizations: Not on file    Relationship status: Not on file  . Intimate partner violence:    Fear of current or ex partner: Not on file    Emotionally abused: Not on file    Physically abused: Not on file    Forced sexual activity: Not on file  Other Topics Concern    . Not on file  Social History Narrative  . Not on file    Family History  Problem Relation Age of Onset  . Hypertension Mother   . Stroke Maternal Grandmother     ROS- All systems are reviewed and negative except as per the HPI above  Physical Exam: Vitals:   06/14/18 1057  BP: 132/78  Pulse: (!) 58  Weight: 83.9 kg  Height: 5\' 9"  (1.753 m)   Wt Readings from Last 3 Encounters:  06/14/18 83.9 kg  04/26/18 83 kg  03/08/18 81.6 kg    Labs: Lab Results  Component Value Date   NA 137 06/14/2018   K 4.3 06/14/2018   CL 104 06/14/2018   CO2 27 06/14/2018   GLUCOSE 146 (H) 06/14/2018   BUN 15 06/14/2018   CREATININE 0.86 06/14/2018   CALCIUM 9.2 06/14/2018   MG 2.1 06/14/2018   Lab Results  Component Value Date   INR 1.4 (H) 05/18/2017   Lab Results  Component Value Date   CHOL 139 02/01/2018   HDL 43 02/01/2018   LDLCALC 77 02/01/2018   TRIG 94 02/01/2018     GEN- The patient is well appearing, alert and oriented x 3 today.   Head- normocephalic, atraumatic Eyes-  Sclera clear, conjunctiva pink Ears- hearing intact Oropharynx- clear Neck- supple, no JVP Lymph- no cervical lymphadenopathy Lungs- Clear to ausculation bilaterally, normal work of breathing Heart- irregular rate and rhythm, no murmurs, rubs or gallops, PMI not laterally displaced GI- soft, NT, ND, + BS Extremities- no clubbing, cyanosis, or edema MS- no significant deformity or atrophy Skin- no rash or lesion Psych- euthymic mood, full affect Neuro- strength and sensation are intact  EKG- sinus brady  at 53 bpm, qrs int 86 ms, qtc 477 ms      Assessment and Plan: 1.Persisitent afib/flutter s/p ablation 08/25/17  Cardioversion x 2, s/p ablation, with ERAF Then admitted for Tikosyn, initially converted but then with ERAF  Now with SR and some afib but pt does not feel the need to have another ablation yet Conitnue dofetilide 500 mcg bid  Continue metoprolol 25 mg qd  Continue  xarelto  20 mg a daily for CHA2DS2VASc of 2 Bmet/mag    F/u up with Dr. Katrinka Blazing in April, I will see in August for tikosyn surveillance  Elvina Sidle. [facility] [address] [place], [place] [address] [phone]

## 2018-06-15 ENCOUNTER — Other Ambulatory Visit: Payer: Self-pay | Admitting: Interventional Cardiology

## 2018-07-11 ENCOUNTER — Encounter: Payer: Self-pay | Admitting: *Deleted

## 2018-07-21 ENCOUNTER — Telehealth: Payer: Self-pay | Admitting: *Deleted

## 2018-07-21 DIAGNOSIS — I480 Paroxysmal atrial fibrillation: Secondary | ICD-10-CM

## 2018-07-21 DIAGNOSIS — E785 Hyperlipidemia, unspecified: Secondary | ICD-10-CM

## 2018-07-21 NOTE — Telephone Encounter (Signed)
-----   Message from [name], LPN sent at 1/61/0960  7:19 AM EDT ----- Needs Lipid and liver in late Feb 2020

## 2018-07-21 NOTE — Telephone Encounter (Signed)
Called pt and left message to call back and schedule labs.  Order has been placed for lipid and liver.

## 2018-08-08 NOTE — Telephone Encounter (Signed)
Spoke with pt and scheduled him to come for labs on 08/10/2018.  Adding a CBC since pt is on Eliquis.  Last BMET and Mg was 06/2018.

## 2018-08-10 ENCOUNTER — Other Ambulatory Visit: Payer: Medicare Other | Admitting: *Deleted

## 2018-08-10 DIAGNOSIS — I480 Paroxysmal atrial fibrillation: Secondary | ICD-10-CM

## 2018-08-10 DIAGNOSIS — E785 Hyperlipidemia, unspecified: Secondary | ICD-10-CM

## 2018-08-10 LAB — CBC
HEMATOCRIT: 38.8 % (ref 37.5–51.0)
HEMOGLOBIN: 13.3 g/dL (ref 13.0–17.7)
MCH: 30.8 pg (ref 26.6–33.0)
MCHC: 34.3 g/dL (ref 31.5–35.7)
MCV: 90 fL (ref 79–97)
Platelets: 244 10*3/uL (ref 150–450)
RBC: 4.32 x10E6/uL (ref 4.14–5.80)
RDW: 12.9 % (ref 11.6–15.4)
WBC: 6.6 10*3/uL (ref 3.4–10.8)

## 2018-08-10 LAB — LIPID PANEL
CHOL/HDL RATIO: 3.4 ratio (ref 0.0–5.0)
Cholesterol, Total: 131 mg/dL (ref 100–199)
HDL: 39 mg/dL — ABNORMAL LOW (ref >39)
LDL CALC: 74 mg/dL (ref 0–99)
Triglycerides: 91 mg/dL (ref 0–149)
VLDL Cholesterol Cal: 18 mg/dL (ref 5–40)

## 2018-08-10 LAB — HEPATIC FUNCTION PANEL
ALBUMIN: 4.3 g/dL (ref 3.8–4.8)
ALT: 31 IU/L (ref 0–44)
AST: 29 IU/L (ref 0–40)
Alkaline Phosphatase: 69 IU/L (ref 39–117)
BILIRUBIN TOTAL: 0.5 mg/dL (ref 0.0–1.2)
Bilirubin, Direct: 0.14 mg/dL (ref 0.00–0.40)
TOTAL PROTEIN: 6.4 g/dL (ref 6.0–8.5)

## 2018-09-05 ENCOUNTER — Other Ambulatory Visit: Payer: Self-pay

## 2018-09-05 ENCOUNTER — Encounter (INDEPENDENT_AMBULATORY_CARE_PROVIDER_SITE_OTHER): Payer: Medicare Other | Admitting: Ophthalmology

## 2018-09-05 DIAGNOSIS — H353132 Nonexudative age-related macular degeneration, bilateral, intermediate dry stage: Secondary | ICD-10-CM | POA: Diagnosis not present

## 2018-09-05 DIAGNOSIS — H35033 Hypertensive retinopathy, bilateral: Secondary | ICD-10-CM

## 2018-09-05 DIAGNOSIS — I10 Essential (primary) hypertension: Secondary | ICD-10-CM | POA: Diagnosis not present

## 2018-09-05 DIAGNOSIS — H43813 Vitreous degeneration, bilateral: Secondary | ICD-10-CM

## 2018-09-05 DIAGNOSIS — H33303 Unspecified retinal break, bilateral: Secondary | ICD-10-CM

## 2018-09-05 DIAGNOSIS — H35372 Puckering of macula, left eye: Secondary | ICD-10-CM | POA: Diagnosis not present

## 2018-09-05 DIAGNOSIS — H2513 Age-related nuclear cataract, bilateral: Secondary | ICD-10-CM

## 2018-09-07 ENCOUNTER — Telehealth: Payer: Self-pay | Admitting: Interventional Cardiology

## 2018-09-07 NOTE — Telephone Encounter (Signed)
Spoke with pt in regards to appt scheduled for 09/11/2018 with Dr. Katrinka Blazing.  Pt denies any cardiac issues.  Rescheduled for 12/15/2018.  Advised to contact office sooner if any issues.

## 2018-09-11 ENCOUNTER — Ambulatory Visit: Payer: Medicare Other | Admitting: Interventional Cardiology

## 2018-09-18 ENCOUNTER — Other Ambulatory Visit: Payer: Self-pay | Admitting: Interventional Cardiology

## 2018-09-18 NOTE — Telephone Encounter (Signed)
Pt is a 68yr old male who saw Afib clinic on 06/14/18. Weight on 04/26/18 was 83Kg. SCr on 06/14/18 was 0.86. CrCl is 78mL/min. Will refill Xarelto 20mg  QD.

## 2018-10-18 ENCOUNTER — Other Ambulatory Visit (HOSPITAL_COMMUNITY): Payer: Self-pay | Admitting: Nurse Practitioner

## 2018-10-18 ENCOUNTER — Other Ambulatory Visit: Payer: Self-pay | Admitting: Interventional Cardiology

## 2018-11-14 ENCOUNTER — Telehealth: Payer: Self-pay

## 2018-11-14 NOTE — Telephone Encounter (Signed)
YOUR CARDIOLOGY TEAM HAS ARRANGED FOR AN E-VISIT FOR YOUR APPOINTMENT - PLEASE REVIEW IMPORTANT INFORMATION BELOW SEVERAL DAYS PRIOR TO YOUR APPOINTMENT ° °Due to the recent COVID-19 pandemic, we are transitioning in-person office visits to tele-medicine visits in an effort to decrease unnecessary exposure to our patients, their families, and staff. These visits are billed to your insurance just like a normal visit is. We also encourage you to sign up for MyChart if you have not already done so. You will need a smartphone if possible. For patients that do not have this, we can still complete the visit using a regular telephone but do prefer a smartphone to enable video when possible. You may have a family member that lives with you that can help. If possible, we also ask that you have a blood pressure cuff and scale at home to measure your blood pressure, heart rate and weight prior to your scheduled appointment. Patients with clinical needs that need an in-person evaluation and testing will still be able to come to the office if absolutely necessary. If you have any questions, feel free to call our office. ° ° ° ° °YOUR PROVIDER WILL BE USING THE FOLLOWING PLATFORM TO COMPLETE YOUR VISIT: Phone Call ° °• IF USING MYCHART - How to Download the MyChart App to Your SmartPhone  ° °- If Apple, go to App Store and type in MyChart in the search bar and download the app. If Android, ask patient to go to Google Play Store and type in MyChart in the search bar and download the app. The app is free but as with any other app downloads, your phone may require you to verify saved payment information or Apple/Android password.  °- You will need to then log into the app with your MyChart username and password, and select [facility] as your healthcare provider to link the account.  °- When it is time for your visit, go to the MyChart app, find appointments, and click Begin Video Visit. Be sure to Select Allow for your device to  access the Microphone and Camera for your visit. You will then be connected, and your provider will be with you shortly. ° **If you have any issues connecting or need assistance, please contact MyChart service desk [phone] ([phone])** ° **If using a computer, in order to ensure the best quality for your visit, you will need to use either of the following Internet Browsers: Google Chrome or Microsoft Edge** ° °• IF USING DOXIMITY or [email or web address] - The staff will give you instructions on receiving your link to join the meeting the day of your visit.  ° ° ° ° °2-3 DAYS BEFORE YOUR APPOINTMENT ° °You will receive a telephone call from one of our [facility] team members - your caller ID may say "Unknown caller." If this is a video visit, we will walk you through how to get the video launched on your phone. We will remind you check your blood pressure, heart rate and weight prior to your scheduled appointment. If you have an Apple Watch or Kardia, please upload any pertinent ECG strips the day before or morning of your appointment to MyChart. Our staff will also make sure you have reviewed the consent and agree to move forward with your scheduled tele-health visit.  ° ° ° °THE DAY OF YOUR APPOINTMENT ° °Approximately 15 minutes prior to your scheduled appointment, you will receive a telephone call from one of [facility] team - your caller ID may say "Unknown caller."    Our staff will confirm medications, vital signs for the day and any symptoms you may be experiencing. Please have this information available prior to the time of visit start. It may also be helpful for you to have a pad of paper and pen handy for any instructions given during your visit. They will also walk you through joining the smartphone meeting if this is a video visit. ° ° ° °CONSENT FOR TELE-HEALTH VISIT - PLEASE REVIEW ° °I hereby voluntarily request, consent and authorize [facility] and its employed or contracted physicians, physician  assistants, nurse practitioners or other licensed health care professionals (the Practitioner), to provide me with telemedicine health care services (the “Services") as deemed necessary by the treating Practitioner. I acknowledge and consent to receive the Services by the Practitioner via telemedicine. I understand that the telemedicine visit will involve communicating with the Practitioner through live audiovisual communication technology and the disclosure of certain medical information by electronic transmission. I acknowledge that I have been given the opportunity to request an in-person assessment or other available alternative prior to the telemedicine visit and am voluntarily participating in the telemedicine visit. ° °I understand that I have the right to withhold or withdraw my consent to the use of telemedicine in the course of my care at any time, without affecting my right to future care or treatment, and that the Practitioner or I may terminate the telemedicine visit at any time. I understand that I have the right to inspect all information obtained and/or recorded in the course of the telemedicine visit and may receive copies of available information for a reasonable fee.  I understand that some of the potential risks of receiving the Services via telemedicine include:  °• Delay or interruption in medical evaluation due to technological equipment failure or disruption; °• Information transmitted may not be sufficient (e.g. poor resolution of images) to allow for appropriate medical decision making by the Practitioner; and/or  °• In rare instances, security protocols could fail, causing a breach of personal health information. ° °Furthermore, I acknowledge that it is my responsibility to provide information about my medical history, conditions and care that is complete and accurate to the best of my ability. I acknowledge that Practitioner's advice, recommendations, and/or decision may be based on  factors not within their control, such as incomplete or inaccurate data provided by me or distortions of diagnostic images or specimens that may result from electronic transmissions. I understand that the practice of medicine is not an exact science and that Practitioner makes no warranties or guarantees regarding treatment outcomes. I acknowledge that I will receive a copy of this consent concurrently upon execution via email to the email address I last provided but may also request a printed copy by calling the office of [facility].   ° °I understand that my insurance will be billed for this visit.  ° °I have read or had this consent read to me. °• I understand the contents of this consent, which adequately explains the benefits and risks of the Services being provided via telemedicine.  °• I have been provided ample opportunity to ask questions regarding this consent and the Services and have had my questions answered to my satisfaction. °• I give my informed consent for the services to be provided through the use of telemedicine in my medical care ° °By participating in this telemedicine visit I agree to the above. °

## 2018-11-14 NOTE — Telephone Encounter (Signed)
LM for pt to return my call to discuss his upcoming appt on 7/10 with Dr Tamala Julian.

## 2018-11-27 ENCOUNTER — Telehealth: Payer: Medicare Other | Admitting: Interventional Cardiology

## 2018-11-28 ENCOUNTER — Telehealth: Payer: Self-pay

## 2018-11-28 NOTE — Telephone Encounter (Signed)

## 2018-11-28 NOTE — Progress Notes (Signed)
Cardiology Office Note:    Date:  11/29/2018   ID:  Anthony SabinaRoderick B Friend, DOB 05-05-1951, MRN [id number]  PCP:  Daisy Florooss, Charles Alan, MD  Cardiologist:  No primary care provider on file.   Referring MD: Daisy Florooss, Charles Alan, MD   Chief Complaint  Patient presents with  . Atrial Fibrillation    History of Present Illness:    Anthony Cole is a 68 y.o. male with a hx of obstructive sleep apnea on CPAP, paroxysmal atrial fibrillation s/p ablation 08/2017, hypertension, hyperlipidemia, CAD with coronary calcium score 250, and diabetes mellitus II without complications Recurrent AFIB --> Tykosyn.03/08/2018.  Patient is doing okay.  He denies chest pain, syncope, and prolonged atrial fibrillation.  No medication side effects.  His energy levels have been slow.  When energy is low, this is the same feeling he gets when he is in atrial fibrillation.  He wonders if he is in atrial fibrillation more than he is in sinus rhythm.  ECG today demonstrates atrial flutter with controlled rate.  He states his current level of fatigue is manageable.  He may decide against repeat ablation and live with his current state if he is out of rhythm the majority of the time.  Past Medical History:  Diagnosis Date  . Arthritis   . Atrial fibrillation (HCC)   . Diabetes mellitus without complication (HCC)    type 2  . Hyperlipidemia   . Hypertension   . Hypothyroidism   . Obesity (BMI 30-39.9) 01/25/2016  . OSA on CPAP 07/16/2014   Moderate with AHI 19/hr on CPAP at 8cm H2O  . Pre-diabetes   . Visit for monitoring Tikosyn therapy 10/25/2017    Past Surgical History:  Procedure Laterality Date  . ATRIAL FIBRILLATION ABLATION N/A 08/25/2017   Procedure: ATRIAL FIBRILLATION ABLATION;  Surgeon: Hillis RangeAllred, James, MD;  [facility];  Service: Cardiovascular;  Laterality: N/A;  . CARDIOVERSION  11/04/2011   Procedure: CARDIOVERSION;  Surgeon: Lesleigh NoeHenry W Zakyra Kukuk III, MD;  [facility];  Service:  Cardiovascular;  Laterality: N/A;  . CARDIOVERSION N/A 07/02/2013   Procedure: CARDIOVERSION;  Surgeon: Lesleigh NoeHenry W Katieann Hungate III, MD;  [facility];  Service: Cardiovascular;  Laterality: N/A;  10:07 elective cardioversion Lido 40mg , Propofol 60mg ,IV...200 joules, synched, pt presently in A flutter,...cardioverted to NSR...  . CARDIOVERSION N/A 09/19/2013   Procedure: CARDIOVERSION;  Surgeon: Lesleigh NoeHenry W Lizzete Gough III, MD;  [facility];  Service: Cardiovascular;  Laterality: N/A;  . CARDIOVERSION N/A 08/13/2014   Procedure: CARDIOVERSION;  Surgeon: Lars MassonKatarina H Nelson, MD;  [facility];  Service: Cardiovascular;  Laterality: N/A;  . CARDIOVERSION N/A 05/23/2017   Procedure: CARDIOVERSION;  Surgeon: Vesta MixerNahser, Philip J, MD;  [facility];  Service: Cardiovascular;  Laterality: N/A;  . CARDIOVERSION N/A 09/09/2017   Procedure: CARDIOVERSION;  Surgeon: Pricilla Riffleoss, Paula V, MD;  [facility];  Service: Cardiovascular;  Laterality: N/A;  . CARDIOVERSION N/A 09/30/2017   Procedure: CARDIOVERSION;  Surgeon: Laqueta LindenKoneswaran, Suresh A, MD;  [facility];  Service: Cardiovascular;  Laterality: N/A;  . EYE SURGERY    . FINGER FRACTURE SURGERY Left "when I was a kid"  . FRACTURE SURGERY    . KNEE ARTHROSCOPY W/ DEBRIDEMENT Bilateral   . RETINAL LASER PROCEDURE Bilateral    "tears on both; not detachments"  . SHOULDER ARTHROSCOPY W/ ROTATOR CUFF REPAIR Bilateral   . STAPEDES SURGERY Right   . TONSILLECTOMY    . TYMPANOPLASTY Bilateral    "skin grafts to ear  drums"    Current Medications: Current Meds  Medication Sig  . acyclovir (ZOVIRAX) 200 MG capsule Take 200 mg by mouth 2 (two) times daily as needed (first sign of fever blister).   [name]. desonide (DESOWEN) 0.05 % lotion Apply 1 application topically daily as needed (dry skin).  [name]. diltiazem (CARDIZEM) 30 MG tablet Take 1 tablet every 4 hours AS NEEDED for AFIB heart rate >100 as long as top number of blood pressure >100.  [name].  dofetilide (TIKOSYN) 500 MCG capsule Take 1 capsule (500 mcg total) by mouth 2 (two) times daily.  [name]. doxycycline (VIBRA-TABS) 100 MG tablet Take 100 mg by mouth daily.   [name]. levothyroxine (SYNTHROID, LEVOTHROID) 88 MCG tablet Take 88 mcg by mouth daily before breakfast.   . losartan (COZAAR) 50 MG tablet TAKE 1 TABLET ONCE DAILY.  . metoprolol succinate (TOPROL-XL) 25 MG 24 hr tablet TAKE 1 TABLET ONCE DAILY.  . Multiple Vitamins-Minerals (PRESERVISION AREDS 2+MULTI VIT PO) Take 1 capsule by mouth 2 (two) times daily.  . potassium chloride SA (K-DUR) 20 MEQ tablet TAKE 1 TABLET BY MOUTH TWICE DAILY.  . rivaroxaban (XARELTO) 20 MG TABS tablet Take 1 tablet (20 mg total) by mouth daily with supper.  . rosuvastatin (CRESTOR) 20 MG tablet TAKE 1 TABLET ONCE DAILY.   Current Facility-Administered Medications for the 11/29/18 encounter (Office Visit) with Lyn RecordsSmith, Ridgely Anastacio W, MD  Medication  . 0.9 %  sodium chloride infusion  . sodium chloride flush (NS) 0.9 % injection 3 mL  . sodium chloride flush (NS) 0.9 % injection 3 mL     Allergies:   Gabapentin and Other   Social History   Socioeconomic History  . Marital status: Single    Spouse name: Not on file  . Number of children: Not on file  . Years of education: Not on file  . Highest education level: Not on file  Occupational History  . Not on file  Social Needs  . Financial resource strain: Not on file  . Food insecurity    Worry: Not on file    Inability: Not on file  . Transportation needs    Medical: Not on file    Non-medical: Not on file  Tobacco Use  . Smoking status: Never Smoker  . Smokeless tobacco: Never Used  Substance and Sexual Activity  . Alcohol use: No  . Drug use: No  . Sexual activity: Not Currently  Lifestyle  . Physical activity    Days per week: Not on file    Minutes per session: Not on file  . Stress: Not on file  Relationships  . Social Musicianconnections    Talks on phone: Not on file    Gets together: Not on  file    Attends religious service: Not on file    Active member of club or organization: Not on file    Attends meetings of clubs or organizations: Not on file    Relationship status: Not on file  Other Topics Concern  . Not on file  Social History Narrative  . Not on file     Family History: The patient's family history includes Hypertension in his mother; Stroke in his maternal grandmother.  ROS:   Please see the history of present illness.    He is having no chest pain.  He has not had syncope or particular medication side effects.  All other systems reviewed and are negative.  EKGs/Labs/Other Studies Reviewed:    The following studies were  reviewed today: No new data  EKG:  EKG atrial flutter with controlled rate of 73 bpm.  Prominent voltage.  Recent Labs: 06/14/2018: BUN 15; Creatinine, Ser 0.86; Magnesium 2.1; Potassium 4.3; Sodium 137 08/10/2018: ALT 31; Hemoglobin 13.3; Platelets 244  Recent Lipid Panel    Component Value Date/Time   CHOL 131 08/10/2018 0926   TRIG 91 08/10/2018 0926   HDL 39 (L) 08/10/2018 0926   CHOLHDL 3.4 08/10/2018 0926   LDLCALC 74 08/10/2018 0926    Physical Exam:    VS:  BP (!) 136/92   Pulse 88   Ht 5\' 9"  (1.753 m)   Wt 184 lb 1.9 oz (83.5 kg)   SpO2 98%   BMI 27.19 kg/m     Wt Readings from Last 3 Encounters:  11/29/18 184 lb 1.9 oz (83.5 kg)  06/14/18 185 lb (83.9 kg)  04/26/18 183 lb (83 kg)     GEN: Patient Facemask in place. No acute distress HEENT: Normal NECK: No JVD. LYMPHATICS: No lymphadenopathy CARDIAC: Irregularly irregular RR.  No murmur, no gallop, no edema VASCULAR: 2+ bilateral pulses, no bruits RESPIRATORY:  Clear to auscultation without rales, wheezing or rhonchi  ABDOMEN: Soft, non-tender, non-distended, No pulsatile mass, MUSCULOSKELETAL: No deformity  SKIN: Warm and dry NEUROLOGIC:  Alert and oriented x 3 PSYCHIATRIC:  Normal affect   ASSESSMENT:    1. Paroxysmal atrial fibrillation (HCC)   2.  Essential hypertension   3. OSA (obstructive sleep apnea)   4. Hyperlipidemia with target LDL less than 70   5. Long term current use of anticoagulant therapy   6. Coronary artery calcification seen on CAT scan   7. Atypical atrial flutter ([place])   8. High risk medication use   9. Educated About Covid-19 Virus Infection    PLAN:    In order of problems listed above:  1. Consider discontinuation of dofetilide.  48-hour Holter monitor to determine if he is in continuous A. fib before discontinuation.  If he is predominantly out of rhythm he feels that he may decide not to have further ablation but would like to discuss it with Dr. Rayann Heman. 2. Adequate blood pressure control.  Low-salt diet. 3. Compliant with CPAP. 4. Target LDL should be less than 70. 5. Continue apixaban therapy.  No bleeding complications. 6. Stable without coronary symptoms.  Secondary prevention discussed. 7. Recurrent as noted.  Please see dialogue on the atrial fibrillation. 8. On dofetilide but may need to be discontinued if he is predominantly in atrial flutter.  Overall education and awareness concerning primary/secondary risk prevention was discussed in detail: LDL less than 70, hemoglobin A1c less than 7, blood pressure target less than 130/80 mmHg, >150 minutes of moderate aerobic activity per week, avoidance of smoking, weight control (via diet and exercise), and continued surveillance/management of/for obstructive sleep apnea.    Medication Adjustments/Labs and Tests Ordered: Current medicines are reviewed at length with the patient today.  Concerns regarding medicines are outlined above.  Orders Placed This Encounter  Procedures  . LONG TERM MONITOR (3-14 DAYS)   No orders of the defined types were placed in this encounter.   Patient Instructions  Medication Instructions:  Your physician recommends that you continue on your current medications as directed. Please refer to the Current Medication list  given to you today.  If you need a refill on your cardiac medications before your next appointment, please call your pharmacy.   Lab work: None If you have labs (blood work) drawn today  and your tests are completely normal, you will receive your results only by: [name]. MyChart Message (if you have MyChart) OR . A paper copy in the mail If you have any lab test that is abnormal or we need to change your treatment, we will call you to review the results.  Testing/Procedures: Your physician has recommended that you wear a monitor for 3 days. Monitors are medical devices that record the heart's electrical activity. Doctors most often use these monitors to diagnose arrhythmias. Arrhythmias are problems with the speed or rhythm of the heartbeat. The monitor is a small, portable device. You can wear one while you do your normal daily activities. This is usually used to diagnose what is causing palpitations/syncope (passing out).    Follow-Up: Your physician recommends that you schedule a follow-up appointment next available with Dr. Johney FrameAllred or Afib Clinic.   At [facility], you and your health needs are our priority.  As part of our continuing mission to provide you with exceptional heart care, we have created designated Provider Care Teams.  These Care Teams include your primary Cardiologist (physician) and Advanced Practice Providers (APPs -  Physician Assistants and Nurse Practitioners) who all work together to provide you with the care you need, when you need it. You will need a follow up appointment in 6 months.  Please call our office 2 months in advance to schedule this appointment.  You may see Dr. Katrinka BlazingSmith or one of the following Advanced Practice Providers on your designated Care Team:   Norma FredricksonLori Gerhardt, NP Nada BoozerLaura Ingold, NP . Georgie ChardJill McDaniel, NP  Any Other Special Instructions Will Be Listed Below (If Applicable).       Signed, Lesleigh NoeHenry W Torrence Branagan III, MD  11/29/2018 8:51 AM    [facility]

## 2018-11-29 ENCOUNTER — Telehealth: Payer: Self-pay | Admitting: Radiology

## 2018-11-29 ENCOUNTER — Ambulatory Visit (INDEPENDENT_AMBULATORY_CARE_PROVIDER_SITE_OTHER): Payer: Medicare Other | Admitting: Interventional Cardiology

## 2018-11-29 ENCOUNTER — Encounter: Payer: Self-pay | Admitting: Interventional Cardiology

## 2018-11-29 ENCOUNTER — Other Ambulatory Visit: Payer: Self-pay

## 2018-11-29 VITALS — BP 136/92 | HR 88 | Ht 69.0 in | Wt 184.1 lb

## 2018-11-29 DIAGNOSIS — I10 Essential (primary) hypertension: Secondary | ICD-10-CM | POA: Diagnosis not present

## 2018-11-29 DIAGNOSIS — I484 Atypical atrial flutter: Secondary | ICD-10-CM

## 2018-11-29 DIAGNOSIS — G4733 Obstructive sleep apnea (adult) (pediatric): Secondary | ICD-10-CM

## 2018-11-29 DIAGNOSIS — I480 Paroxysmal atrial fibrillation: Secondary | ICD-10-CM | POA: Diagnosis not present

## 2018-11-29 DIAGNOSIS — Z7189 Other specified counseling: Secondary | ICD-10-CM

## 2018-11-29 DIAGNOSIS — Z7901 Long term (current) use of anticoagulants: Secondary | ICD-10-CM

## 2018-11-29 DIAGNOSIS — E785 Hyperlipidemia, unspecified: Secondary | ICD-10-CM

## 2018-11-29 DIAGNOSIS — Z79899 Other long term (current) drug therapy: Secondary | ICD-10-CM

## 2018-11-29 DIAGNOSIS — I2510 Atherosclerotic heart disease of native coronary artery without angina pectoris: Secondary | ICD-10-CM

## 2018-11-29 LAB — BASIC METABOLIC PANEL
BUN/Creatinine Ratio: 20 (ref 10–24)
BUN: 21 mg/dL (ref 8–27)
CO2: 26 mmol/L (ref 20–29)
Calcium: 9.7 mg/dL (ref 8.6–10.2)
Chloride: 99 mmol/L (ref 96–106)
Creatinine, Ser: 1.03 mg/dL (ref 0.76–1.27)
GFR calc Af Amer: 87 mL/min/{1.73_m2} (ref >59)
GFR calc non Af Amer: 75 mL/min/{1.73_m2} (ref >59)
Glucose: 152 mg/dL — ABNORMAL HIGH (ref 65–99)
Potassium: 5 mmol/L (ref 3.5–5.2)
Sodium: 139 mmol/L (ref 134–144)

## 2018-11-29 LAB — CBC
Hematocrit: 45.3 % (ref 37.5–51.0)
Hemoglobin: 15.2 g/dL (ref 13.0–17.7)
MCH: 30.4 pg (ref 26.6–33.0)
MCHC: 33.6 g/dL (ref 31.5–35.7)
MCV: 91 fL (ref 79–97)
Platelets: 264 10*3/uL (ref 150–450)
RBC: 5 x10E6/uL (ref 4.14–5.80)
RDW: 13.1 % (ref 11.6–15.4)
WBC: 7.9 10*3/uL (ref 3.4–10.8)

## 2018-11-29 LAB — MAGNESIUM: Magnesium: 2.4 mg/dL — ABNORMAL HIGH (ref 1.6–2.3)

## 2018-11-29 NOTE — Telephone Encounter (Signed)
Enrolled patient for a 3 day Zio monitor to be mailed. Patient knows monitor will be mailed and to expect it to arrive in 3-4 days

## 2018-11-29 NOTE — Patient Instructions (Addendum)
Medication Instructions:  Your physician recommends that you continue on your current medications as directed. Please refer to the Current Medication list given to you today.  If you need a refill on your cardiac medications before your next appointment, please call your pharmacy.   Lab work: BMET, CBC and Magnesium today  If you have labs (blood work) drawn today and your tests are completely normal, you will receive your results only by: [name] MyChart Message (if you have MyChart) OR . A paper copy in the mail If you have any lab test that is abnormal or we need to change your treatment, we will call you to review the results.  Testing/Procedures: Your physician has recommended that you wear a monitor for 3 days. Monitors are medical devices that record the heart's electrical activity. Doctors most often use these monitors to diagnose arrhythmias. Arrhythmias are problems with the speed or rhythm of the heartbeat. The monitor is a small, portable device. You can wear one while you do your normal daily activities. This is usually used to diagnose what is causing palpitations/syncope (passing out).    Follow-Up: Your physician recommends that you schedule a follow-up appointment next available with Dr. Rayann Heman or Afib Clinic.   At [facility], you and your health needs are our priority.  As part of our continuing mission to provide you with exceptional heart care, we have created designated Provider Care Teams.  These Care Teams include your primary Cardiologist (physician) and Advanced Practice Providers (APPs -  Physician Assistants and Nurse Practitioners) who all work together to provide you with the care you need, when you need it. You will need a follow up appointment in 6 months.  Please call our office 2 months in advance to schedule this appointment.  You may see Dr. Tamala Julian or one of the following Advanced Practice Providers on your designated Care Team:   Truitt Merle, NP Cecilie Kicks, NP . Kathyrn Drown, NP  Any Other Special Instructions Will Be Listed Below (If Applicable).

## 2018-11-30 ENCOUNTER — Encounter: Payer: Self-pay | Admitting: Interventional Cardiology

## 2018-12-01 ENCOUNTER — Encounter (HOSPITAL_COMMUNITY): Payer: Self-pay

## 2018-12-04 ENCOUNTER — Telehealth: Payer: Self-pay | Admitting: Internal Medicine

## 2018-12-04 NOTE — Telephone Encounter (Signed)
New message    Per Dr. Tamala Julian, pt needed f/u appt with Dr. Rayann Heman for next available or afib clinic. Called to offer appt with Dr. Rayann Heman virtually on 07.01.20. If pt returns call, please reach out via secure chat or my direct line [phone]. I will speak to pt.

## 2018-12-06 ENCOUNTER — Ambulatory Visit (INDEPENDENT_AMBULATORY_CARE_PROVIDER_SITE_OTHER): Payer: Medicare Other

## 2018-12-06 DIAGNOSIS — I480 Paroxysmal atrial fibrillation: Secondary | ICD-10-CM | POA: Diagnosis not present

## 2018-12-06 DIAGNOSIS — I484 Atypical atrial flutter: Secondary | ICD-10-CM | POA: Diagnosis not present

## 2018-12-15 ENCOUNTER — Ambulatory Visit: Payer: Medicare Other | Admitting: Interventional Cardiology

## 2018-12-26 ENCOUNTER — Other Ambulatory Visit: Payer: Self-pay | Admitting: Internal Medicine

## 2018-12-26 ENCOUNTER — Other Ambulatory Visit: Payer: Self-pay

## 2018-12-26 ENCOUNTER — Encounter (HOSPITAL_COMMUNITY): Payer: Self-pay

## 2018-12-26 ENCOUNTER — Other Ambulatory Visit (HOSPITAL_COMMUNITY): Payer: Self-pay | Admitting: *Deleted

## 2018-12-26 MED ORDER — DOFETILIDE 500 MCG PO CAPS: 500.0000 ug | ORAL_CAPSULE | Freq: Two times a day (BID) | ORAL | 3 refills | Status: DC

## 2018-12-28 ENCOUNTER — Telehealth: Payer: Self-pay

## 2019-01-02 NOTE — Telephone Encounter (Signed)
Left message regarding appt on 01/03/19. 

## 2019-01-03 ENCOUNTER — Telehealth: Payer: Medicare Other | Admitting: Internal Medicine

## 2019-01-08 ENCOUNTER — Telehealth: Payer: Self-pay | Admitting: Interventional Cardiology

## 2019-01-08 NOTE — Telephone Encounter (Signed)
  Patient states he is returning a call from [place] regarding monitor results

## 2019-01-08 NOTE — Telephone Encounter (Signed)
Informed pt of results. Pt verbalized understanding. 

## 2019-01-22 ENCOUNTER — Telehealth: Payer: Self-pay

## 2019-01-22 NOTE — Telephone Encounter (Signed)
Left message regarding appt o 01/24/19.

## 2019-01-24 ENCOUNTER — Other Ambulatory Visit: Payer: Self-pay

## 2019-01-24 ENCOUNTER — Ambulatory Visit: Payer: Medicare Other | Admitting: Internal Medicine

## 2019-01-24 ENCOUNTER — Encounter: Payer: Self-pay | Admitting: Internal Medicine

## 2019-01-24 VITALS — BP 142/84 | HR 58 | Ht 69.0 in | Wt 179.0 lb

## 2019-01-24 DIAGNOSIS — G4733 Obstructive sleep apnea (adult) (pediatric): Secondary | ICD-10-CM

## 2019-01-24 DIAGNOSIS — I4819 Other persistent atrial fibrillation: Secondary | ICD-10-CM | POA: Diagnosis not present

## 2019-01-24 NOTE — Progress Notes (Signed)
.   PCP: Lawerance Cruel, MD Primary Cardiologist: Dr Smith/  [facility] Pax for OSA Primary EP: Dr Haskel Schroeder is a 68 y.o. male who presents today for routine electrophysiology followup.  Since last being seen in our clinic, the patient reports doing very well.  He continues to have occasional afib.  Today, he denies symptoms of palpitations, chest pain, shortness of breath,  lower extremity edema, dizziness, presyncope, or syncope.  The patient is otherwise without complaint today.   Past Medical History:  Diagnosis Date  . Arthritis   . Atrial fibrillation ([place])   . Diabetes mellitus without complication ([place])    type 2  . Hyperlipidemia   . Hypertension   . Hypothyroidism   . Obesity (BMI 30-39.9) 01/25/2016  . OSA on CPAP 07/16/2014   Moderate with AHI 19/hr on CPAP at 8cm H2O  . Pre-diabetes   . Visit for monitoring Tikosyn therapy 10/25/2017   Past Surgical History:  Procedure Laterality Date  . ATRIAL FIBRILLATION ABLATION N/A 08/25/2017   Procedure: ATRIAL FIBRILLATION ABLATION;  Surgeon: Thompson Grayer, MD;  Location: [place] CV LAB;  Service: Cardiovascular;  Laterality: N/A;  . CARDIOVERSION  11/04/2011   Procedure: CARDIOVERSION;  Surgeon: Sinclair Grooms, MD;  Location: [place];  Service: Cardiovascular;  Laterality: N/A;  . CARDIOVERSION N/A 07/02/2013   Procedure: CARDIOVERSION;  Surgeon: Sinclair Grooms, MD;  Location: [facility] ENDOSCOPY;  Service: Cardiovascular;  Laterality: N/A;  10:07 elective cardioversion Lido 40mg , Propofol 60mg ,IV...200 joules, synched, pt presently in A flutter,...cardioverted to NSR...  . CARDIOVERSION N/A 09/19/2013   Procedure: CARDIOVERSION;  Surgeon: Sinclair Grooms, MD;  Location: [facility] ENDOSCOPY;  Service: Cardiovascular;  Laterality: N/A;  . CARDIOVERSION N/A 08/13/2014   Procedure: CARDIOVERSION;  Surgeon: Dorothy Spark, MD;  Location: [facility] ENDOSCOPY;  Service: Cardiovascular;  Laterality: N/A;  . CARDIOVERSION N/A 05/23/2017   Procedure: CARDIOVERSION;  Surgeon: Thayer Headings, MD;  Location: [facility] ENDOSCOPY;  Service: Cardiovascular;  Laterality: N/A;  . CARDIOVERSION N/A 09/09/2017   Procedure: CARDIOVERSION;  Surgeon: Fay Records, MD;  Location: [facility] ENDOSCOPY;  Service: Cardiovascular;  Laterality: N/A;  . CARDIOVERSION N/A 09/30/2017   Procedure: CARDIOVERSION;  Surgeon: Herminio Commons, MD;  Location: [facility] ENDOSCOPY;  Service: Cardiovascular;  Laterality: N/A;  . EYE SURGERY    . FINGER FRACTURE SURGERY Left "when I was a kid"  . FRACTURE SURGERY    . KNEE ARTHROSCOPY W/ DEBRIDEMENT Bilateral   . RETINAL LASER PROCEDURE Bilateral    "tears on both; not detachments"  . SHOULDER ARTHROSCOPY W/ ROTATOR CUFF REPAIR Bilateral   . STAPEDES SURGERY Right   . TONSILLECTOMY    . TYMPANOPLASTY Bilateral    "skin grafts to ear drums"    ROS- all systems are reviewed and negatives except as per HPI above  Current Outpatient Medications  Medication Sig Dispense Refill  . acyclovir (ZOVIRAX) 200 MG capsule Take 200 mg by mouth 2 (two) times daily as needed (first sign of fever blister).     [name] desonide (DESOWEN) 0.05 % lotion Apply 1 application topically daily as needed (dry skin).    [name] diltiazem (CARDIZEM) 30 MG tablet Take 1 tablet every 4 hours AS NEEDED for AFIB heart rate >100 as long as top number of blood pressure >100. 45 tablet 1  . dofetilide (TIKOSYN) 500 MCG capsule TAKE (1) CAPSULE TWICE DAILY. 180 capsule 3  . doxycycline (VIBRA-TABS) 100 MG tablet Take 100 mg by  mouth daily.     [name]. levothyroxine (SYNTHROID, LEVOTHROID) 88 MCG tablet Take 88 mcg by mouth daily before breakfast.     . losartan (COZAAR) 50 MG tablet TAKE 1 TABLET ONCE DAILY. 90 tablet 3  . metoprolol succinate (TOPROL-XL) 25 MG 24 hr tablet TAKE 1 TABLET ONCE DAILY. 90 tablet 2  . Multiple Vitamins-Minerals (PRESERVISION AREDS 2+MULTI VIT PO) Take 1 capsule by mouth 2 (two) times daily.    . potassium chloride SA (K-DUR) 20 MEQ tablet TAKE  1 TABLET BY MOUTH TWICE DAILY. 60 tablet 6  . rivaroxaban (XARELTO) 20 MG TABS tablet Take 1 tablet (20 mg total) by mouth daily with supper. 90 tablet 1  . rosuvastatin (CRESTOR) 20 MG tablet TAKE 1 TABLET ONCE DAILY. 90 tablet 1   Current Facility-Administered Medications  Medication Dose Route Frequency Provider Last Rate Last Dose  . 0.9 %  sodium chloride infusion  250 mL Intravenous Continuous Lyn RecordsSmith, Henry W, MD      . sodium chloride flush (NS) 0.9 % injection 3 mL  3 mL Intravenous Q12H Lyn RecordsSmith, Henry W, MD      . sodium chloride flush (NS) 0.9 % injection 3 mL  3 mL Intravenous PRN Lyn RecordsSmith, Henry W, MD        Physical Exam: Vitals:   01/24/19 1031  BP: (!) 142/84  Pulse: (!) 58  SpO2: 98%  Weight: 179 lb (81.2 kg)  Height: 5\' 9"  (1.753 m)    GEN- The patient is well appearing, alert and oriented x 3 today.   Head- normocephalic, atraumatic Eyes-  Sclera clear, conjunctiva pink Ears- hearing intact Oropharynx- clear Lungs- Clear to ausculation bilaterally, normal work of breathing Heart- Regular rate and rhythm, no murmurs, rubs or gallops, PMI not laterally displaced GI- soft, NT, ND, + BS Extremities- no clubbing, cyanosis, or edema  Wt Readings from Last 3 Encounters:  01/24/19 179 lb (81.2 kg)  11/29/18 184 lb 1.9 oz (83.5 kg)  06/14/18 185 lb (83.9 kg)    EKG tracing ordered today is personally reviewed and shows sinus rhythm, stable qtc  Assessment and Plan:  1. Persistent atrial fibrillation Doing well post ablation on tikosyn I have personally reviewed his Zio monitor which revealed AF burden of 32%.  We discussed ablation today.  He is not currently ready to proceed.  I also offered long term monitoring with an implanted loop recorder.  I would not advise short term monitoring in the future.  I think that long term monitoring could be very helpful in AF management post ablation. chads2vasc score is 3.  He is on xarelto He wishes to make no changes today   Labs 11/2018 reviewed  2. OSA Uses CPAP  Follow-up in AF clinic in 4 months.  Hillis RangeJames Mark Benecke MD, [facility] 01/24/2019 11:09 AM

## 2019-01-24 NOTE — Patient Instructions (Addendum)
Medication Instructions:  Your physician recommends that you continue on your current medications as directed. Please refer to the Current Medication list given to you today.  Labwork: None ordered.  Testing/Procedures: Your physician recommends you have a loop recorder placed.  Codes for loop implant:  For the implantation:  98338  For monthly monitoring:  93298 + 93299   Follow-Up: Your physician wants you to follow-up in: 4 months with AFIB clinic.     Any Other Special Instructions Will Be Listed Below (If Applicable).  If you need a refill on your cardiac medications before your next appointment, please call your pharmacy.     Implantable Loop Recorder Placement  An implantable loop recorder is a small electronic device that is placed under the skin of your chest. It is about the size of an AA ("double A") battery. The device records the electrical activity of your heart over a long period of time. Your health care provider can download these recordings to monitor your heart. You may need an implantable loop recorder if you have periods of abnormal heart activity (arrhythmias) or unexplained fainting (syncope). The recorder can be left in place for 1 year or longer. What are the risks? Generally, this is a safe procedure. However, problems may occur, including:  Infection.  Bleeding.  Allergic reactions to anesthetic medicines.  Damage to nerves or blood vessels.  Failure of the device to work. This could require another surgery to replace it. What happens before the procedure?   You may have a physical exam, blood tests, and imaging tests of your heart, such as a chest X-ray.  Follow instructions from your health care provider about eating or drinking restrictions.  Ask your health care provider about: ? Changing or stopping your regular medicines. This is especially important if you are taking diabetes medicines or blood thinners. ? Taking over-the-counter  medicines, vitamins, herbs, and supplements.  Ask your health care provider how your surgical site will be marked or identified.  Ask your health care provider what steps will be taken to help prevent infection. These may include: ? Removing hair at the surgery site. ? Washing skin with a germ-killing soap.  Plan to have someone take you home from the hospital or clinic.  Plan to have a responsible adult care for you for at least 24 hours after you leave the hospital or clinic. This is important.  Do not use any products that contain nicotine or tobacco, such as cigarettes and e-cigarettes. If you need help quitting, ask your health care provider. What happens during the procedure?  An IV will be inserted into one of your veins.  You may be given one or more of the following: ? A medicine to help you relax (sedative). ? A medicine to numb the area (local anesthetic).  A small incision will be made on the left side of your upper chest.  A pocket will be created under your skin.  The device will be placed in the pocket.  The incision will be closed with stitches (sutures) or adhesive strips.  A bandage (dressing) will be placed over the incision. The procedure may vary among health care providers and hospitals. What happens after the procedure?  Your blood pressure, heart rate, breathing rate, and blood oxygen level will be monitored until you leave the hospital or clinic.  You may be able to go home on the day of your surgery. Before you go home: ? Your health care provider will program your recorder. ?  You will learn how to trigger your device with a handheld activator. ? You will learn how to send recordings to your health care provider. ? You will get an ID card for your device, and you will be told when to use it.  Do not drive for 24 hours if you were given a sedative during your procedure. Summary  An implantable loop recorder is a small electronic device that is  placed under the skin of your chest to monitor your heart over a long period of time.  The recorder can be left in place for 1 year or longer.  Plan to have someone take you home from the hospital or clinic. This information is not intended to replace advice given to you by your health care provider. Make sure you discuss any questions you have with your health care provider. Document Released: 05/05/2015 Document Revised: 07/28/2017 Document Reviewed: 07/09/2017 Elsevier Patient Education  2020 [employer]

## 2019-02-07 ENCOUNTER — Encounter: Payer: Medicare Other | Attending: Family Medicine | Admitting: *Deleted

## 2019-02-07 ENCOUNTER — Other Ambulatory Visit: Payer: Self-pay

## 2019-02-07 DIAGNOSIS — E119 Type 2 diabetes mellitus without complications: Secondary | ICD-10-CM | POA: Diagnosis not present

## 2019-02-07 DIAGNOSIS — Z794 Long term (current) use of insulin: Secondary | ICD-10-CM | POA: Insufficient documentation

## 2019-02-07 NOTE — Patient Instructions (Addendum)
Plan:  Aim for 3 Carb Choices per meal (45 grams)   Aim for 0-2 Carbs per snack if hungry  Include protein in moderation with your meals and snacks Consider reading food labels for Total Carbohydrate of foods Continue your activity level by walking for 30 minutes twice daily as tolerated Consider checking BG at alternate times per day  Consider taking medication as directed by MD  The food we eat digests down to glucose starting about 15 minutes after a meal, peaks by 2 hours and is typically gone in 4 hours

## 2019-02-14 NOTE — Progress Notes (Signed)
Diabetes Self-Management Education  Visit Type: First/Initial  Appt. Start Time: 1100 Appt. End Time: 1230  02/14/2019  Mr. Anthony Cole, identified by name and date of birth, is a 68 y.o. male with a diagnosis of Diabetes: Type 2. Patient was initially irritated that he did not receive paperwork prior to his appointment and was concerned about insurance coverage. We explained that Medicare pays for nutrition education for diabetes and renal failure. 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   We proceeded with the visit, patient had lots of questions and shared quite a bit of information so a broad variety of information was covered during this appointment. We plan to follow up for more time to complete his education plan.   Patient states he is physically active with walking about 30 minutes every morning or may split it up into 15 minutes twice a day. He provided a detailed diet history for part of his day, we will complete that at his next visit. He has a meter but needed assistance learning how to use it, which we did today.   ASSESSMENT  There were no vitals taken for this visit. There is no height or weight on file to calculate BMI.  Diabetes Self-Management Education - 02/07/19 1125      Visit Information   Visit Type  First/Initial      Initial Visit   Diabetes Type  Type 2    Are you currently following a meal plan?  No    Are you taking your medications as prescribed?  Not on Medications      Psychosocial Assessment   Self-care barriers  None    Other persons present  Patient    Patient Concerns  Nutrition/Meal planning;Glycemic Control    Preferred Learning Style  Auditory;Visual;Hands on    [place]    How often do you need to have someone help you when you read instructions, pamphlets, or other written materials from your doctor or pharmacy?  1 - Never    What is the last grade level you completed in school?   B.S.      Pre-Education Assessment   Patient understands the diabetes disease and treatment process.  Needs Instruction    Patient understands incorporating nutritional management into lifestyle.  Needs Instruction    Patient undertands incorporating physical activity into lifestyle.  Needs Instruction    Patient understands monitoring blood glucose, interpreting and using results  Needs Instruction    Patient understands prevention, detection, and treatment of chronic complications.  Needs Instruction    Patient understands how to develop strategies to address psychosocial issues.  Needs Instruction    Patient understands how to develop strategies to promote health/change behavior.  Needs Instruction      Complications   Last HgB A1C per patient/outside source  7 %    How often do you check your blood sugar?  0 times/day (not testing)      Dietary Intake   Breakfast  cooked in water a variety of vegetables, protein such as chicken breast OR Fiber One cereal with skim milk (occasionally almond milk) OR Chobani yogurt with blueberries    Snack (morning)  nuts, occasionally natural PNB    Lunch  did not complete diet history today      Patient Education   Previous Diabetes Education  No    Disease state   Definition of diabetes, type 1 and 2, and the diagnosis of diabetes  Nutrition management   Carbohydrate counting;Food label reading, portion sizes and measuring food.;Role of diet in the treatment of diabetes and the relationship between the three main macronutrients and blood glucose level    Physical activity and exercise   Role of exercise on diabetes management, blood pressure control and cardiac health.    Monitoring  Purpose and frequency of SMBG.;Identified appropriate SMBG and/or A1C goals.      Individualized Goals (developed by patient)   Nutrition  Follow meal plan discussed    Physical Activity  Exercise 3-5 times per week    Medications  Not Applicable    Reducing Risk   examine blood glucose patterns      Outcomes   Expected Outcomes  Demonstrated interest in learning. Expect positive outcomes    Future DMSE  4-6 wks    Program Status  Not Completed       Individualized Plan for Diabetes Self-Management Training:   Learning Objective:  Patient will have a greater understanding of diabetes self-management. Patient education plan is to attend individual and/or group sessions per assessed needs and concerns.   Plan:   Patient Instructions  Plan:  Aim for 3 Carb Choices per meal (45 grams)   Aim for 0-2 Carbs per snack if hungry  Include protein in moderation with your meals and snacks Consider reading food labels for Total Carbohydrate of foods Continue your activity level by walking for 30 minutes twice daily as tolerated Consider checking BG at alternate times per day  Consider taking medication as directed by MD  The food we eat digests down to glucose starting about 15 minutes after a meal, peaks by 2 hours and is typically gone in 4 hours  Expected Outcomes:  Demonstrated interest in learning. Expect positive outcomes  Education material provided: ADA - How to Thrive: A Guide for Your Journey with Diabetes, Food label handouts, A1C conversion sheet, Meal plan card and Carbohydrate counting sheet  If problems or questions, patient to contact team via:  Phone  Future DSME appointment: 4-6 wks

## 2019-03-07 ENCOUNTER — Encounter (INDEPENDENT_AMBULATORY_CARE_PROVIDER_SITE_OTHER): Payer: Medicare Other | Admitting: Ophthalmology

## 2019-03-07 ENCOUNTER — Other Ambulatory Visit: Payer: Self-pay

## 2019-03-07 ENCOUNTER — Ambulatory Visit: Payer: Medicare Other | Admitting: *Deleted

## 2019-03-07 DIAGNOSIS — H33303 Unspecified retinal break, bilateral: Secondary | ICD-10-CM

## 2019-03-07 DIAGNOSIS — H35033 Hypertensive retinopathy, bilateral: Secondary | ICD-10-CM | POA: Diagnosis not present

## 2019-03-07 DIAGNOSIS — H43813 Vitreous degeneration, bilateral: Secondary | ICD-10-CM

## 2019-03-07 DIAGNOSIS — H2513 Age-related nuclear cataract, bilateral: Secondary | ICD-10-CM

## 2019-03-07 DIAGNOSIS — H353132 Nonexudative age-related macular degeneration, bilateral, intermediate dry stage: Secondary | ICD-10-CM

## 2019-03-07 DIAGNOSIS — I10 Essential (primary) hypertension: Secondary | ICD-10-CM

## 2019-03-07 DIAGNOSIS — H35372 Puckering of macula, left eye: Secondary | ICD-10-CM

## 2019-03-13 ENCOUNTER — Ambulatory Visit: Payer: Medicare Other | Admitting: *Deleted

## 2019-03-13 ENCOUNTER — Other Ambulatory Visit: Payer: Self-pay | Admitting: Interventional Cardiology

## 2019-04-17 ENCOUNTER — Other Ambulatory Visit: Payer: Self-pay | Admitting: Cardiology

## 2019-04-25 ENCOUNTER — Telehealth: Payer: Self-pay | Admitting: *Deleted

## 2019-04-25 ENCOUNTER — Encounter: Payer: Self-pay | Admitting: *Deleted

## 2019-04-25 NOTE — Telephone Encounter (Signed)
Called patient and left a message to call back.

## 2019-04-25 NOTE — Progress Notes (Signed)
Cardiology Office Note:    Date:  04/26/2019   ID:  Anthony Cole, DOB 10-Sep-1950, MRN [id number]  PCP:  Lawerance Cruel, MD  Cardiologist:  Daneen Schick, MD   Referring MD: Lawerance Cruel, MD   Chief Complaint  Patient presents with  . Sleep Apnea  . Hypertension    History of Present Illness:    Anthony Cole is a 68 y.o. male with a hx of moderate OSA with an AHI of 19 events per hour. He is onCPAPat8cm H2O.  He is doing well with his CPAP device and thinks that he has gotten used to it.  He tolerates the mask and feels the pressure is adequate.  Since going on CPAP he feels rested in the am and has no significant daytime sleepiness.  He denies any significant mouth or nasal dryness or nasal congestion.  He does not think that he snores.     Past Medical History:  Diagnosis Date  . Arthritis   . Atrial fibrillation ([place])   . Diabetes mellitus without complication ([place])    type 2  . Hyperlipidemia   . Hypertension   . Hypothyroidism   . Obesity (BMI 30-39.9) 01/25/2016  . OSA on CPAP 07/16/2014   Moderate with AHI 19/hr on CPAP at 8cm H2O  . Pre-diabetes   . Visit for monitoring Tikosyn therapy 10/25/2017    Past Surgical History:  Procedure Laterality Date  . ATRIAL FIBRILLATION ABLATION N/A 08/25/2017   Procedure: ATRIAL FIBRILLATION ABLATION;  Surgeon: Thompson Grayer, MD;  Location: [facility];  Service: Cardiovascular;  Laterality: N/A;  . CARDIOVERSION  11/04/2011   Procedure: CARDIOVERSION;  Surgeon: Sinclair Grooms, MD;  Location: [facility];  Service: Cardiovascular;  Laterality: N/A;  . CARDIOVERSION N/A 07/02/2013   Procedure: CARDIOVERSION;  Surgeon: Sinclair Grooms, MD;  Location: [facility] ENDOSCOPY;  Service: Cardiovascular;  Laterality: N/A;  10:07 elective cardioversion Lido 40mg , Propofol 60mg ,IV...200 joules, synched, pt presently in A flutter,...cardioverted to NSR...  . CARDIOVERSION N/A 09/19/2013   Procedure: CARDIOVERSION;  Surgeon:  Sinclair Grooms, MD;  Location: [facility] ENDOSCOPY;  Service: Cardiovascular;  Laterality: N/A;  . CARDIOVERSION N/A 08/13/2014   Procedure: CARDIOVERSION;  Surgeon: Dorothy Spark, MD;  Location: [facility] ENDOSCOPY;  Service: Cardiovascular;  Laterality: N/A;  . CARDIOVERSION N/A 05/23/2017   Procedure: CARDIOVERSION;  Surgeon: Thayer Headings, MD;  Location: [facility] ENDOSCOPY;  Service: Cardiovascular;  Laterality: N/A;  . CARDIOVERSION N/A 09/09/2017   Procedure: CARDIOVERSION;  Surgeon: Fay Records, MD;  Location: [facility] ENDOSCOPY;  Service: Cardiovascular;  Laterality: N/A;  . CARDIOVERSION N/A 09/30/2017   Procedure: CARDIOVERSION;  Surgeon: Herminio Commons, MD;  Location: [facility] ENDOSCOPY;  Service: Cardiovascular;  Laterality: N/A;  . EYE SURGERY    . FINGER FRACTURE SURGERY Left "when I was a kid"  . FRACTURE SURGERY    . KNEE ARTHROSCOPY W/ DEBRIDEMENT Bilateral   . RETINAL LASER PROCEDURE Bilateral    "tears on both; not detachments"  . SHOULDER ARTHROSCOPY W/ ROTATOR CUFF REPAIR Bilateral   . STAPEDES SURGERY Right   . TONSILLECTOMY    . TYMPANOPLASTY Bilateral    "skin grafts to ear drums"    Current Medications: Current Meds  Medication Sig  . acyclovir (ZOVIRAX) 200 MG capsule Take 200 mg by mouth 2 (two) times daily as needed (first sign of fever blister).   . Cholecalciferol (VITAMIN D3 PO) Take 1 tablet by mouth daily.  [name] desonide (  DESOWEN) 0.05 % lotion Apply 1 application topically daily as needed (dry skin).  [name] diltiazem (CARDIZEM) 30 MG tablet Take 1 tablet every 4 hours AS NEEDED for AFIB heart rate >100 as long as top number of blood pressure >100.  [name] dofetilide (TIKOSYN) 500 MCG capsule TAKE (1) CAPSULE TWICE DAILY.  [name] doxycycline (VIBRA-TABS) 100 MG tablet Take 100 mg by mouth daily.   [name] levothyroxine (SYNTHROID, LEVOTHROID) 88 MCG tablet Take 88 mcg by mouth daily before breakfast.   . losartan (COZAAR) 50 MG tablet TAKE 1 TABLET ONCE DAILY.  . metoprolol succinate  (TOPROL-XL) 25 MG 24 hr tablet TAKE 1 TABLET ONCE DAILY.  . Multiple Vitamins-Minerals (PRESERVISION AREDS 2+MULTI VIT PO) Take 1 capsule by mouth 2 (two) times daily.  . potassium chloride SA (K-DUR) 20 MEQ tablet TAKE 1 TABLET BY MOUTH TWICE DAILY.  . rivaroxaban (XARELTO) 20 MG TABS tablet Take 1 tablet (20 mg total) by mouth daily with supper.  . rosuvastatin (CRESTOR) 20 MG tablet TAKE 1 TABLET ONCE DAILY.   Current Facility-Administered Medications for the 04/26/19 encounter (Office Visit) with Quintella Reichert, MD  Medication  . 0.9 %  sodium chloride infusion  . sodium chloride flush (NS) 0.9 % injection 3 mL  . sodium chloride flush (NS) 0.9 % injection 3 mL     Allergies:   Gabapentin and Other   Social History   Socioeconomic History  . Marital status: Single    Spouse name: Not on file  . Number of children: Not on file  . Years of education: Not on file  . Highest education level: Not on file  Occupational History  . Not on file  Social Needs  . Financial resource strain: Not on file  . Food insecurity    Worry: Not on file    Inability: Not on file  . Transportation needs    Medical: Not on file    Non-medical: Not on file  Tobacco Use  . Smoking status: Never Smoker  . Smokeless tobacco: Never Used  Substance and Sexual Activity  . Alcohol use: No  . Drug use: No  . Sexual activity: Not Currently  Lifestyle  . Physical activity    Days per week: Not on file    Minutes per session: Not on file  . Stress: Not on file  Relationships  . Social Musician on phone: Not on file    Gets together: Not on file    Attends religious service: Not on file    Active member of club or organization: Not on file    Attends meetings of clubs or organizations: Not on file    Relationship status: Not on file  Other Topics Concern  . Not on file  Social History Narrative  . Not on file     Family History: The patient's family history includes  Hypertension in his mother; Stroke in his maternal grandmother.  ROS:   Please see the history of present illness.    ROS  All other systems reviewed and negative.   EKGs/Labs/Other Studies Reviewed:    The following studies were reviewed today: PAP compliance download  EKG:  EKG is not ordered today.    Recent Labs: 08/10/2018: ALT 31 11/29/2018: BUN 21; Creatinine, Ser 1.03; Hemoglobin 15.2; Magnesium 2.4; Platelets 264; Potassium 5.0; Sodium 139   Recent Lipid Panel    Component Value Date/Time   CHOL 131 08/10/2018 0926   TRIG 91 08/10/2018 0926  HDL 39 (L) 08/10/2018 0926   CHOLHDL 3.4 08/10/2018 0926   LDLCALC 74 08/10/2018 0926    Physical Exam:    VS:  There were no vitals taken for this visit.    Wt Readings from Last 3 Encounters:  01/24/19 179 lb (81.2 kg)  11/29/18 184 lb 1.9 oz (83.5 kg)  06/14/18 185 lb (83.9 kg)     GEN:  Well nourished, well developed in no acute distress HEENT: Normal NECK: No JVD; No carotid bruits LYMPHATICS: No lymphadenopathy CARDIAC: RRR, no murmurs, rubs, gallops RESPIRATORY:  Clear to auscultation without rales, wheezing or rhonchi  ABDOMEN: Soft, non-tender, non-distended MUSCULOSKELETAL:  No edema; No deformity  SKIN: Warm and dry NEUROLOGIC:  Alert and oriented x 3 PSYCHIATRIC:  Normal affect   ASSESSMENT:    1. OSA (obstructive sleep apnea)   2. Essential hypertension   3. Obesity (BMI 30-39.9)    PLAN:    In order of problems listed above:  1.  OSA - The patient is tolerating PAP therapy well without any problems. The PAP download was reviewed today and showed an AHI of 5.1/hr on 9 cm H2O with 100% compliance in using more than 4 hours nightly.  The patient has been using and benefiting from PAP use and will continue to benefit from therapy. He tells me that he is up for a new device in mid December as his device will be 68 years old. I will order him a Resmed CPAP with heated humidity and mask of choice set at 9cm  H2O.  He will need followup with me 10 weeks after he gets his new device to document compliance.    2.  HTN -BP controlled on exam -continue Losartan 50mg  daily and Toprol XL 25mg  daily -creatinine was stable at 1.08 in July 2020.  3.  Obesity -I have encouraged him to get into a routine exercise program and cut back on carbs and portions.   Medication Adjustments/Labs and Tests Ordered: Current medicines are reviewed at length with the patient today.  Concerns regarding medicines are outlined above.  No orders of the defined types were placed in this encounter.  No orders of the defined types were placed in this encounter.   Signed, Armanda Magicraci Nickoles Gregori, MD  04/26/2019 8:09 AM    [facility]

## 2019-04-26 ENCOUNTER — Ambulatory Visit: Payer: Medicare Other | Admitting: Cardiology

## 2019-04-26 ENCOUNTER — Telehealth: Payer: Self-pay | Admitting: *Deleted

## 2019-04-26 ENCOUNTER — Encounter: Payer: Self-pay | Admitting: Cardiology

## 2019-04-26 ENCOUNTER — Other Ambulatory Visit: Payer: Self-pay

## 2019-04-26 VITALS — HR 90 | Ht 69.0 in | Wt 168.8 lb

## 2019-04-26 DIAGNOSIS — E669 Obesity, unspecified: Secondary | ICD-10-CM | POA: Diagnosis not present

## 2019-04-26 DIAGNOSIS — I10 Essential (primary) hypertension: Secondary | ICD-10-CM

## 2019-04-26 DIAGNOSIS — G4733 Obstructive sleep apnea (adult) (pediatric): Secondary | ICD-10-CM | POA: Diagnosis not present

## 2019-04-26 NOTE — Patient Instructions (Signed)
Medication Instructions:  Your provider recommends that you continue on your current medications as directed. Please refer to the Current Medication list given to you today.     Follow-Up: You will be called to schedule your appointment when you are set up with your new device.  Any Other Special Instructions Will Be Listed Below (If Applicable). A new CPAP has been ordered for you. Our CPAP assistant, Gae Bon, will be in touch with you. Her direct number is [phone].

## 2019-04-26 NOTE — Telephone Encounter (Signed)
-----   Message from Theodoro Parma, RN sent at 04/26/2019  8:47 AM EST ----- Regarding: CPAP orders New cpap order is in He uses the new [place] He will need to be set up 10 weeks after new machine is obtained  Thanks!

## 2019-04-26 NOTE — Telephone Encounter (Signed)
Order placed to [facility] via community message today.

## 2019-04-26 NOTE — Telephone Encounter (Signed)
Patient came in for his visit on 04/26/19 8 am.His follow up visit will be virtual per TT.

## 2019-04-26 NOTE — Telephone Encounter (Signed)
Anthony Margarita, MD  Freada Bergeron, CMA        Please try to call him and tell him that we are trying to do as many virtual visits as possible due to increasing COVID cases. We just had an email sent to providers today from admin requesting that we increase virtual visit.

## 2019-04-26 NOTE — Telephone Encounter (Signed)
From: Freada Bergeron, CMA  Sent: 04/25/2019  5:51 PM EST  To: Sueanne Margarita, MD  Subject: 11/19 appt 8:00                  Patient insist on coming in tomorrow. I called him today and yesterday and left messages to call about his appointment but he never called back. Someone called today and said they wanted to make sure he was coming in tomorrow so now he will not do a virtual appointment.

## 2019-05-04 ENCOUNTER — Other Ambulatory Visit: Payer: Self-pay | Admitting: Internal Medicine

## 2019-05-07 ENCOUNTER — Other Ambulatory Visit (HOSPITAL_COMMUNITY): Payer: Self-pay | Admitting: Nurse Practitioner

## 2019-05-07 NOTE — Telephone Encounter (Signed)
Prescription refill request for Xarelto received.   Last office visit: [place], 04/26/2019 Weight: 76.6. kg Age: 68 y.o. Scr: 1.08, 12/06/2018 CrCl: 72 ml/min  Prescription refill sent.

## 2019-05-14 NOTE — Progress Notes (Signed)
Cardiology Office Note:    Date:  05/16/2019   ID:  Anthony Cole, DOB 10-22-1950, MRN [id number]  PCP:  Lawerance Cruel, MD  Cardiologist:  Sinclair Grooms, MD   Referring MD: Lawerance Cruel, MD   Chief Complaint  Patient presents with  . Atrial Fibrillation    History of Present Illness:    Anthony Cole is a 68 y.o. male with a hx of obstructive sleep apnea on CPAP, hypertension, hyperlipidemia,CAD with coronary calcium score 250, diabetes mellitus II, paroxysmal atrial fibrillation s/p ablation 08/2017 & recurrent AFIB --> Tykosyn10/07/2017. Repeat ablation discussed but not accepted.  Has occasional mild symptomatic atrial fibrillation.  Was clinically in atrial fibrillation when he last saw Dr. Radford Pax.  He is doing well today.  He has no complaints.  He has not had chest pain.  He denies orthopnea, PND, angina, syncope, and edema.  Past Medical History:  Diagnosis Date  . Arthritis   . Atrial fibrillation ([place])   . Diabetes mellitus without complication ([place])    type 2  . Hyperlipidemia   . Hypertension   . Hypothyroidism   . Obesity (BMI 30-39.9) 01/25/2016  . OSA on CPAP 07/16/2014   Moderate with AHI 19/hr on CPAP at 8cm H2O  . Pre-diabetes   . Visit for monitoring Tikosyn therapy 10/25/2017    Past Surgical History:  Procedure Laterality Date  . ATRIAL FIBRILLATION ABLATION N/A 08/25/2017   Procedure: ATRIAL FIBRILLATION ABLATION;  Surgeon: Thompson Grayer, MD;  Location: [place] CV LAB;  Service: Cardiovascular;  Laterality: N/A;  . CARDIOVERSION  11/04/2011   Procedure: CARDIOVERSION;  Surgeon: Sinclair Grooms, MD;  Location: [place];  Service: Cardiovascular;  Laterality: N/A;  . CARDIOVERSION N/A 07/02/2013   Procedure: CARDIOVERSION;  Surgeon: Sinclair Grooms, MD;  Location: [facility] ENDOSCOPY;  Service: Cardiovascular;  Laterality: N/A;  10:07 elective cardioversion Lido '40mg'$ , Propofol '60mg'$ ,IV...200 joules, synched, pt presently in A  flutter,...cardioverted to NSR...  . CARDIOVERSION N/A 09/19/2013   Procedure: CARDIOVERSION;  Surgeon: Sinclair Grooms, MD;  Location: [facility] ENDOSCOPY;  Service: Cardiovascular;  Laterality: N/A;  . CARDIOVERSION N/A 08/13/2014   Procedure: CARDIOVERSION;  Surgeon: Dorothy Spark, MD;  Location: [facility] ENDOSCOPY;  Service: Cardiovascular;  Laterality: N/A;  . CARDIOVERSION N/A 05/23/2017   Procedure: CARDIOVERSION;  Surgeon: Thayer Headings, MD;  Location: [facility] ENDOSCOPY;  Service: Cardiovascular;  Laterality: N/A;  . CARDIOVERSION N/A 09/09/2017   Procedure: CARDIOVERSION;  Surgeon: Fay Records, MD;  Location: [facility] ENDOSCOPY;  Service: Cardiovascular;  Laterality: N/A;  . CARDIOVERSION N/A 09/30/2017   Procedure: CARDIOVERSION;  Surgeon: Herminio Commons, MD;  Location: [facility] ENDOSCOPY;  Service: Cardiovascular;  Laterality: N/A;  . EYE SURGERY    . FINGER FRACTURE SURGERY Left "when I was a kid"  . FRACTURE SURGERY    . KNEE ARTHROSCOPY W/ DEBRIDEMENT Bilateral   . RETINAL LASER PROCEDURE Bilateral    "tears on both; not detachments"  . SHOULDER ARTHROSCOPY W/ ROTATOR CUFF REPAIR Bilateral   . STAPEDES SURGERY Right   . TONSILLECTOMY    . TYMPANOPLASTY Bilateral    "skin grafts to ear drums"    Current Medications: Current Meds  Medication Sig  . acyclovir (ZOVIRAX) 200 MG capsule Take 200 mg by mouth 2 (two) times daily as needed (first sign of fever blister).   . Cholecalciferol (VITAMIN D3 PO) Take 1 tablet by mouth daily.  [name] desonide (DESOWEN) 0.05 % lotion Apply 1  application topically daily as needed (dry skin).  [name] diltiazem (CARDIZEM) 30 MG tablet Take 1 tablet every 4 hours AS NEEDED for AFIB heart rate >100 as long as top number of blood pressure >100.  [name] dofetilide (TIKOSYN) 500 MCG capsule TAKE (1) CAPSULE TWICE DAILY.  [name] doxycycline (VIBRA-TABS) 100 MG tablet Take 100 mg by mouth daily.   [name] levothyroxine (SYNTHROID, LEVOTHROID) 88 MCG tablet Take 88 mcg by mouth daily before  breakfast.   . metoprolol succinate (TOPROL-XL) 25 MG 24 hr tablet TAKE 1 TABLET ONCE DAILY.  . Multiple Vitamins-Minerals (PRESERVISION AREDS 2+MULTI VIT PO) Take 1 capsule by mouth 2 (two) times daily.  . potassium chloride SA (KLOR-CON) 20 MEQ tablet TAKE 1 TABLET BY MOUTH TWICE DAILY.  . rosuvastatin (CRESTOR) 20 MG tablet TAKE 1 TABLET ONCE DAILY.  [name] XARELTO 20 MG TABS tablet TAKE 1 TABLET ONCE DAILY IN THE EVENING.  . [DISCONTINUED] losartan (COZAAR) 50 MG tablet TAKE 1 TABLET ONCE DAILY.   Current Facility-Administered Medications for the 05/16/19 encounter (Office Visit) with Belva Crome, MD  Medication  . 0.9 %  sodium chloride infusion  . sodium chloride flush (NS) 0.9 % injection 3 mL  . sodium chloride flush (NS) 0.9 % injection 3 mL     Allergies:   Gabapentin and Other   Social History   Socioeconomic History  . Marital status: Single    Spouse name: Not on file  . Number of children: Not on file  . Years of education: Not on file  . Highest education level: Not on file  Occupational History  . Not on file  Social Needs  . Financial resource strain: Not on file  . Food insecurity    Worry: Not on file    Inability: Not on file  . Transportation needs    Medical: Not on file    Non-medical: Not on file  Tobacco Use  . Smoking status: Never Smoker  . Smokeless tobacco: Never Used  Substance and Sexual Activity  . Alcohol use: No  . Drug use: No  . Sexual activity: Not Currently  Lifestyle  . Physical activity    Days per week: Not on file    Minutes per session: Not on file  . Stress: Not on file  Relationships  . Social Herbalist on phone: Not on file    Gets together: Not on file    Attends religious service: Not on file    Active member of club or organization: Not on file    Attends meetings of clubs or organizations: Not on file    Relationship status: Not on file  Other Topics Concern  . Not on file  Social History Narrative  .  Not on file     Family History: The patient's family history includes Hypertension in his mother; Stroke in his maternal grandmother.  ROS:   Please see the history of present illness.    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  All other systems reviewed and are negative.  EKGs/Labs/Other Studies Reviewed:    The following studies were reviewed today: No new functional data  EKG:  EKG not repeated but the most recent tracing from 01/24/2019 was reviewed and reveals sinus bradycardia without without significant abnormality.  Recent Labs: 08/10/2018: ALT 31 11/29/2018: BUN 21; Creatinine, Ser 1.03; Hemoglobin 15.2; Magnesium 2.4; Platelets 264; Potassium 5.0; Sodium 139  Recent Lipid Panel    Component Value Date/Time   CHOL 131 08/10/2018 0926  TRIG 91 08/10/2018 0926   HDL 39 (L) 08/10/2018 0926   CHOLHDL 3.4 08/10/2018 0926   LDLCALC 74 08/10/2018 0926    Physical Exam:    VS:  BP (!) 146/78   Pulse (!) 59   Ht '5\' 9"'$  (1.753 m)   Wt 172 lb 12.8 oz (78.4 kg)   SpO2 99%   BMI 25.52 kg/m     Wt Readings from Last 3 Encounters:  05/16/19 172 lb 12.8 oz (78.4 kg)  04/26/19 168 lb 12.8 oz (76.6 kg)  01/24/19 179 lb (81.2 kg)     GEN: Has lost weight and appears younger. No acute distress HEENT: Normal NECK: No JVD. LYMPHATICS: No lymphadenopathy CARDIAC:  RRR without murmur, gallop, or edema. VASCULAR:  Normal Pulses. No bruits. RESPIRATORY:  Clear to auscultation without rales, wheezing or rhonchi  ABDOMEN: Soft, non-tender, non-distended, No pulsatile mass, MUSCULOSKELETAL: No deformity  SKIN: Warm and dry NEUROLOGIC:  Alert and oriented x 3 PSYCHIATRIC:  Normal affect   ASSESSMENT:    1. Paroxysmal atrial fibrillation (HCC)   2. OSA (obstructive sleep apnea)   3. Visit for monitoring Tikosyn therapy   4. Hyperlipidemia with target LDL less than 70   5. Essential hypertension   6. Coronary artery calcification seen on CAT scan   7. Educated about COVID-19 virus infection     PLAN:    In order of problems listed above:  1. Stable with improvement in quality of life on Tikosyn.  Will check magnesium, potassium, and other electrolytes in 1 week. 2. Compliant 3. Laboratory data as noted above will be done in 1 week 4. Target LDL is being achieved as of July. 5. Blood pressure is too high.  Increase losartan to 100 mg/day.  Be met 1 week later. 6. Primary prevention discussed. 7. 3W's is being practiced to avoid COVID-19 infection.  Overall education and awareness concerning primary risk prevention was discussed in detail: LDL less than 70, hemoglobin A1c less than 7, blood pressure target less than 130/80 mmHg, >150 minutes of moderate aerobic activity per week, avoidance of smoking, weight control (via diet and exercise), and continued surveillance/management of/for obstructive sleep apnea.    Medication Adjustments/Labs and Tests Ordered: Current medicines are reviewed at length with the patient today.  Concerns regarding medicines are outlined above.  Orders Placed This Encounter  Procedures  . Basic metabolic panel  . Magnesium   Meds ordered this encounter  Medications  . losartan (COZAAR) 100 MG tablet    Sig: Take 1 tablet (100 mg total) by mouth daily.    Dispense:  90 tablet    Refill:  3    Dose change    Patient Instructions  Medication Instructions:  1) INCREASE Losartan to '100mg'$  once daily  *If you need a refill on your cardiac medications before your next appointment, please call your pharmacy*  Lab Work: BMET and Magnesium in 1 week  If you have labs (blood work) drawn today and your tests are completely normal, you will receive your results only by: [name] MyChart Message (if you have MyChart) OR . A paper copy in the mail If you have any lab test that is abnormal or we need to change your treatment, we will call you to review the results.  Testing/Procedures: None  Follow-Up: At [facility], you and your health needs are our  priority.  As part of our continuing mission to provide you with exceptional heart care, we have created designated Provider Care  Teams.  These Care Teams include your primary Cardiologist (physician) and Advanced Practice Providers (APPs -  Physician Assistants and Nurse Practitioners) who all work together to provide you with the care you need, when you need it.  Your next appointment:   6-9 month(s)  The format for your next appointment:   In Person  Provider:   You may see Sinclair Grooms, MD or one of the following Advanced Practice Providers on your designated Care Team:    Truitt Merle, NP  Cecilie Kicks, NP  Kathyrn Drown, NP   Other Instructions  Please contact the Atrial Fibrillation clinic and reschedule your appointment to March or April.  [phone]     Signed, Sinclair Grooms, MD  05/16/2019 8:44 AM    [place]

## 2019-05-16 ENCOUNTER — Encounter: Payer: Self-pay | Admitting: Interventional Cardiology

## 2019-05-16 ENCOUNTER — Ambulatory Visit: Payer: Medicare Other | Admitting: Interventional Cardiology

## 2019-05-16 ENCOUNTER — Other Ambulatory Visit: Payer: Self-pay

## 2019-05-16 VITALS — BP 146/78 | HR 59 | Ht 69.0 in | Wt 172.8 lb

## 2019-05-16 DIAGNOSIS — Z7189 Other specified counseling: Secondary | ICD-10-CM

## 2019-05-16 DIAGNOSIS — I480 Paroxysmal atrial fibrillation: Secondary | ICD-10-CM

## 2019-05-16 DIAGNOSIS — Z5181 Encounter for therapeutic drug level monitoring: Secondary | ICD-10-CM | POA: Diagnosis not present

## 2019-05-16 DIAGNOSIS — E785 Hyperlipidemia, unspecified: Secondary | ICD-10-CM | POA: Diagnosis not present

## 2019-05-16 DIAGNOSIS — G4733 Obstructive sleep apnea (adult) (pediatric): Secondary | ICD-10-CM | POA: Diagnosis not present

## 2019-05-16 DIAGNOSIS — Z79899 Other long term (current) drug therapy: Secondary | ICD-10-CM

## 2019-05-16 DIAGNOSIS — I2510 Atherosclerotic heart disease of native coronary artery without angina pectoris: Secondary | ICD-10-CM

## 2019-05-16 DIAGNOSIS — I10 Essential (primary) hypertension: Secondary | ICD-10-CM

## 2019-05-16 MED ORDER — LOSARTAN POTASSIUM 100 MG PO TABS: 100.0000 mg | ORAL_TABLET | Freq: Every day | ORAL | 3 refills | Status: DC

## 2019-05-16 NOTE — Patient Instructions (Signed)
Medication Instructions:  1) INCREASE Losartan to 100mg  once daily  *If you need a refill on your cardiac medications before your next appointment, please call your pharmacy*  Lab Work: BMET and Magnesium in 1 week  If you have labs (blood work) drawn today and your tests are completely normal, you will receive your results only by: [name] MyChart Message (if you have MyChart) OR . A paper copy in the mail If you have any lab test that is abnormal or we need to change your treatment, we will call you to review the results.  Testing/Procedures: None  Follow-Up: At [facility], you and your health needs are our priority.  As part of our continuing mission to provide you with exceptional heart care, we have created designated Provider Care Teams.  These Care Teams include your primary Cardiologist (physician) and Advanced Practice Providers (APPs -  Physician Assistants and Nurse Practitioners) who all work together to provide you with the care you need, when you need it.  Your next appointment:   6-9 month(s)  The format for your next appointment:   In Person  Provider:   You may see Sinclair Grooms, MD or one of the following Advanced Practice Providers on your designated Care Team:    Truitt Merle, NP  Cecilie Kicks, NP  Kathyrn Drown, NP   Other Instructions  Please contact the Atrial Fibrillation clinic and reschedule your appointment to March or April.  [phone]

## 2019-05-17 ENCOUNTER — Ambulatory Visit (HOSPITAL_COMMUNITY): Payer: Medicare Other | Admitting: Nurse Practitioner

## 2019-05-23 ENCOUNTER — Other Ambulatory Visit: Payer: Self-pay

## 2019-05-23 ENCOUNTER — Other Ambulatory Visit: Payer: Medicare Other | Admitting: *Deleted

## 2019-05-23 DIAGNOSIS — I480 Paroxysmal atrial fibrillation: Secondary | ICD-10-CM

## 2019-05-23 DIAGNOSIS — I10 Essential (primary) hypertension: Secondary | ICD-10-CM

## 2019-05-23 LAB — BASIC METABOLIC PANEL
BUN/Creatinine Ratio: 19 (ref 10–24)
BUN: 19 mg/dL (ref 8–27)
CO2: 28 mmol/L (ref 20–29)
Calcium: 9.4 mg/dL (ref 8.6–10.2)
Chloride: 98 mmol/L (ref 96–106)
Creatinine, Ser: 1 mg/dL (ref 0.76–1.27)
GFR calc Af Amer: 90 mL/min/{1.73_m2} (ref >59)
GFR calc non Af Amer: 78 mL/min/{1.73_m2} (ref >59)
Glucose: 152 mg/dL — ABNORMAL HIGH (ref 65–99)
Potassium: 4.8 mmol/L (ref 3.5–5.2)
Sodium: 139 mmol/L (ref 134–144)

## 2019-05-23 LAB — MAGNESIUM: Magnesium: 2.2 mg/dL (ref 1.6–2.3)

## 2019-06-21 ENCOUNTER — Encounter (HOSPITAL_COMMUNITY): Payer: Self-pay

## 2019-07-13 ENCOUNTER — Telehealth: Payer: Self-pay

## 2019-07-13 NOTE — Telephone Encounter (Signed)
**Note De-Identified Cheyenne Schumm Obfuscation** Letter received from [facility] stating that they have approved the pts Xarelto PA.  Approval good until 06/06/2020.  I have notified the pts pharmacy of this approval.

## 2019-08-14 ENCOUNTER — Ambulatory Visit (HOSPITAL_COMMUNITY)
Admission: RE | Admit: 2019-08-14 | Discharge: 2019-08-14 | Disposition: A | Discharge status: Home / Self Care | Payer: Medicare PPO | Source: Ambulatory Visit | Attending: Nurse Practitioner | Admitting: Nurse Practitioner

## 2019-08-14 ENCOUNTER — Other Ambulatory Visit: Payer: Self-pay

## 2019-08-14 ENCOUNTER — Encounter (HOSPITAL_COMMUNITY): Payer: Self-pay | Admitting: Nurse Practitioner

## 2019-08-14 VITALS — BP 138/82 | HR 72 | Ht 69.0 in | Wt 175.4 lb

## 2019-08-14 DIAGNOSIS — E785 Hyperlipidemia, unspecified: Secondary | ICD-10-CM | POA: Diagnosis not present

## 2019-08-14 DIAGNOSIS — I4892 Unspecified atrial flutter: Secondary | ICD-10-CM | POA: Insufficient documentation

## 2019-08-14 DIAGNOSIS — Z8249 Family history of ischemic heart disease and other diseases of the circulatory system: Secondary | ICD-10-CM | POA: Insufficient documentation

## 2019-08-14 DIAGNOSIS — Z79890 Hormone replacement therapy (postmenopausal): Secondary | ICD-10-CM | POA: Diagnosis not present

## 2019-08-14 DIAGNOSIS — G4733 Obstructive sleep apnea (adult) (pediatric): Secondary | ICD-10-CM | POA: Insufficient documentation

## 2019-08-14 DIAGNOSIS — M1990 Unspecified osteoarthritis, unspecified site: Secondary | ICD-10-CM | POA: Insufficient documentation

## 2019-08-14 DIAGNOSIS — I10 Essential (primary) hypertension: Secondary | ICD-10-CM | POA: Insufficient documentation

## 2019-08-14 DIAGNOSIS — E119 Type 2 diabetes mellitus without complications: Secondary | ICD-10-CM | POA: Diagnosis not present

## 2019-08-14 DIAGNOSIS — I480 Paroxysmal atrial fibrillation: Secondary | ICD-10-CM | POA: Diagnosis not present

## 2019-08-14 DIAGNOSIS — D6869 Other thrombophilia: Secondary | ICD-10-CM

## 2019-08-14 DIAGNOSIS — Z79899 Other long term (current) drug therapy: Secondary | ICD-10-CM | POA: Diagnosis not present

## 2019-08-14 DIAGNOSIS — Z7901 Long term (current) use of anticoagulants: Secondary | ICD-10-CM | POA: Insufficient documentation

## 2019-08-14 DIAGNOSIS — I4891 Unspecified atrial fibrillation: Secondary | ICD-10-CM | POA: Diagnosis present

## 2019-08-14 DIAGNOSIS — E039 Hypothyroidism, unspecified: Secondary | ICD-10-CM | POA: Insufficient documentation

## 2019-08-14 DIAGNOSIS — I4819 Other persistent atrial fibrillation: Secondary | ICD-10-CM | POA: Diagnosis not present

## 2019-08-14 DIAGNOSIS — Z888 Allergy status to other drugs, medicaments and biological substances status: Secondary | ICD-10-CM | POA: Insufficient documentation

## 2019-08-14 LAB — BASIC METABOLIC PANEL WITH GFR
Anion gap: 12 (ref 5–15)
BUN: 23 mg/dL (ref 8–23)
CO2: 26 mmol/L (ref 22–32)
Calcium: 9.9 mg/dL (ref 8.9–10.3)
Chloride: 107 mmol/L (ref 98–111)
Creatinine, Ser: 0.94 mg/dL (ref 0.61–1.24)
GFR calc Af Amer: >60 mL/min
GFR calc non Af Amer: >60 mL/min
Glucose, Bld: 122 mg/dL — ABNORMAL HIGH (ref 70–99)
Potassium: 4.7 mmol/L (ref 3.5–5.1)
Sodium: 145 mmol/L (ref 135–145)

## 2019-08-14 LAB — MAGNESIUM: Magnesium: 2.3 mg/dL (ref 1.7–2.4)

## 2019-08-14 MED ORDER — RIVAROXABAN 20 MG PO TABS: ORAL_TABLET | ORAL | 2 refills | Status: DC

## 2019-08-14 NOTE — Progress Notes (Addendum)
Primary Care Physician: Daisy Floro, MD Referring Physician: Dr. Wyatt Portela is a 69 y.o. male with a h/o afib s/p ablation 08/25/17. He was being seen in the office as pt has noted return of afib since 2 days after ablation. He felt  he was tolerating his afib better since ablation. He was instructed to increase the metoprolol and his was rate controlled. He denied any swallowing difficulties and had minor bruising at the rt groin site. He was set up for successful cardioversion.   He returns to afib clinic, unfortunately has returned to afib. He believes he held in normal rhythm for about a week. No known trigger. Continues to take flecainde 100 mg bid, and xarelto, no missed doses.   F/u in afib clinic 5/10. Remains in atrial flutter at 84 bpm  He called in last week stating that the 2nd  cardioversion did not hold in SR. Discussed with Dr.Allred and he said to offer pt short term amiodarone or Tikosyn. We discussed this in clinic in detail and he believes he would rather try Tikosyn. Seems to be tolerating a flutter well.  F/u in afib clinic, 5/31. He was admitted for Tikosyn, and did convert to SR but returned to afib prior to d/c. The plan on d/c was if he continued in afib to cardiovert on tikosyn and see if he would maintain SR. Pt is in agreement. He is taking tikosyn and xareoto without missed doses.  F/u in afib clinic 02/14/18. He was in a MD office recently and irregular HB was noted. He is found to be in afib today. He has noticed some palpitations at times. He is tired when he is in afib, and tired when he is in SR.  Options discussed to restore SR.    F/u in afib clinic 06/14/2018. He is having some afib but overall is feeling weill, exercising and carrying on with life. He has been offered another ablation with Dr.Allred but does not know if he is ready for this. He continues on [place].  F/u in afib clinic, 08/14/19. He is out of rhythm today but did not know  until it was brought to his attention. He feels that he goes in and out. Much more tolerable of it after ablation and tikoysn. He has had both covid shots and feels well today. Goes to the gym on a regular basis. Continues  with xarelto 20 mg qd for a CHA2DS2VASc score of 3.   Today, he denies symptoms of palpitations, chest pain, shortness of breath, orthopnea, PND, lower extremity edema, dizziness, presyncope, syncope, or neurologic sequela.+ fatigue. The patient is tolerating medications without difficulties and is otherwise without complaint today.   Past Medical History:  Diagnosis Date  . Arthritis   . Atrial fibrillation (HCC)   . Diabetes mellitus without complication (HCC)    type 2  . Hyperlipidemia   . Hypertension   . Hypothyroidism   . Obesity (BMI 30-39.9) 01/25/2016  . OSA on CPAP 07/16/2014   Moderate with AHI 19/hr on CPAP at 8cm H2O  . Pre-diabetes   . Visit for monitoring Tikosyn therapy 10/25/2017   Past Surgical History:  Procedure Laterality Date  . ATRIAL FIBRILLATION ABLATION N/A 08/25/2017   Procedure: ATRIAL FIBRILLATION ABLATION;  Surgeon: Hillis Range, MD;  Location: [facility] INVASIVE CV LAB;  Service: Cardiovascular;  Laterality: N/A;  . CARDIOVERSION  11/04/2011   Procedure: CARDIOVERSION;  Surgeon: Lesleigh Noe, MD;  Location: [facility] OR;  Service: Cardiovascular;  Laterality: N/A;  . CARDIOVERSION N/A 07/02/2013   Procedure: CARDIOVERSION;  Surgeon: Lesleigh Noe, MD;  Location: [facility] ENDOSCOPY;  Service: Cardiovascular;  Laterality: N/A;  10:07 elective cardioversion Lido 40mg , Propofol 60mg ,IV...200 joules, synched, pt presently in A flutter,...cardioverted to NSR...  . CARDIOVERSION N/A 09/19/2013   Procedure: CARDIOVERSION;  Surgeon: , MD;  Location: [facility] ENDOSCOPY;  Service: Cardiovascular;  Laterality: N/A;  . CARDIOVERSION N/A 08/13/2014   Procedure: CARDIOVERSION;  Surgeon: [facility], MD;  Location: [facility] ENDOSCOPY;  Service:  Cardiovascular;  Laterality: N/A;  . CARDIOVERSION N/A 05/23/2017   Procedure: CARDIOVERSION;  Surgeon: [facility], MD;  Location: [facility] ENDOSCOPY;  Service: Cardiovascular;  Laterality: N/A;  . CARDIOVERSION N/A 09/09/2017   Procedure: CARDIOVERSION;  Surgeon: [facility], MD;  Location: [facility] ENDOSCOPY;  Service: Cardiovascular;  Laterality: N/A;  . CARDIOVERSION N/A 09/30/2017   Procedure: CARDIOVERSION;  Surgeon: [facility], MD;  Location: [facility] ENDOSCOPY;  Service: Cardiovascular;  Laterality: N/A;  . EYE SURGERY    . FINGER FRACTURE SURGERY Left "when I was a kid"  . FRACTURE SURGERY    . KNEE ARTHROSCOPY W/ DEBRIDEMENT Bilateral   . RETINAL LASER PROCEDURE Bilateral    "tears on both; not detachments"  . SHOULDER ARTHROSCOPY W/ ROTATOR CUFF REPAIR Bilateral   . STAPEDES SURGERY Right   . TONSILLECTOMY    . TYMPANOPLASTY Bilateral    "skin grafts to ear drums"    Current Outpatient Medications  Medication Sig Dispense Refill  . acyclovir (ZOVIRAX) 200 MG capsule Take 200 mg by mouth 2 (two) times daily as needed (first sign of fever blister).     . Cholecalciferol (VITAMIN D3 PO) Take 5,000 Units by mouth daily.     Laqueta Linden desonide (DESOWEN) 0.05 % lotion Apply 1 application topically daily as needed (dry skin).    [facility] diltiazem (CARDIZEM) 30 MG tablet Take 1 tablet every 4 hours AS NEEDED for AFIB heart rate >100 as long as top number of blood pressure >100. 45 tablet 1  . dofetilide (TIKOSYN) 500 MCG capsule TAKE (1) CAPSULE TWICE DAILY. 180 capsule 3  . doxycycline (VIBRA-TABS) 100 MG tablet Take 100 mg by mouth in the morning and at bedtime.     [name] levothyroxine (SYNTHROID, LEVOTHROID) 88 MCG tablet Take 88 mcg by mouth daily before breakfast.     . losartan (COZAAR) 100 MG tablet Take 1 tablet (100 mg total) by mouth daily. 90 tablet 3  . metoprolol succinate (TOPROL-XL) 25 MG 24 hr tablet TAKE 1 TABLET ONCE DAILY. 90 tablet 3  . Multiple Vitamins-Minerals (PRESERVISION AREDS  2+MULTI VIT PO) Take 1 capsule by mouth 2 (two) times daily.    . potassium chloride SA (KLOR-CON) 20 MEQ tablet TAKE 1 TABLET BY MOUTH TWICE DAILY. 60 tablet 6  . rivaroxaban (XARELTO) 20 MG TABS tablet TAKE 1 TABLET ONCE DAILY IN THE EVENING. 90 tablet 2  . rosuvastatin (CRESTOR) 20 MG tablet TAKE 1 TABLET ONCE DAILY. 90 tablet 2   Current Facility-Administered Medications  Medication Dose Route Frequency Provider Last Rate Last Admin  . 0.9 %  sodium chloride infusion  250 mL Intravenous Continuous [name], MD      . sodium chloride flush (NS) 0.9 % injection 3 mL  3 mL Intravenous Q12H [name], MD      . sodium chloride flush (NS) 0.9 % injection 3 mL  3 mL Intravenous PRN [name],  MD        Allergies  Allergen Reactions  . Gabapentin Other (See Comments)    Causes Extreme sedation   . Other     Not sure of name of med-for overactive bladder-caused some adverse reactions, stopped taking immediately-per patient    Social History   Socioeconomic History  . Marital status: Single    Spouse name: Not on file  . Number of children: Not on file  . Years of education: Not on file  . Highest education level: Not on file  Occupational History  . Not on file  Tobacco Use  . Smoking status: Never Smoker  . Smokeless tobacco: Never Used  Substance and Sexual Activity  . Alcohol use: No  . Drug use: No  . Sexual activity: Not Currently  Other Topics Concern  . Not on file  Social History Narrative  . Not on file   Social Determinants of Health   Financial Resource Strain:   . Difficulty of Paying Living Expenses: Not on file  Food Insecurity:   . Worried About Programme researcher, broadcasting/film/video in the Last Year: Not on file  . Ran Out of Food in the Last Year: Not on file  Transportation Needs:   . Lack of Transportation (Medical): Not on file  . Lack of Transportation (Non-Medical): Not on file  Physical Activity:   . Days of Exercise per Week: Not on file  .  Minutes of Exercise per Session: Not on file  Stress:   . Feeling of Stress : Not on file  Social Connections:   . Frequency of Communication with Friends and Family: Not on file  . Frequency of Social Gatherings with Friends and Family: Not on file  . Attends Religious Services: Not on file  . Active Member of Clubs or Organizations: Not on file  . Attends Banker Meetings: Not on file  . Marital Status: Not on file  Intimate Partner Violence:   . Fear of Current or Ex-Partner: Not on file  . Emotionally Abused: Not on file  . Physically Abused: Not on file  . Sexually Abused: Not on file    Family History  Problem Relation Age of Onset  . Hypertension Mother   . Stroke Maternal Grandmother     ROS- All systems are reviewed and negative except as per the HPI above  Physical Exam: Vitals:   08/14/19 0945  BP: 138/82  Pulse: 72  Weight: 79.6 kg  Height: 5\' 9"  (1.753 m)   Wt Readings from Last 3 Encounters:  08/14/19 79.6 kg  05/16/19 78.4 kg  04/26/19 76.6 kg    Labs: Lab Results  Component Value Date   NA 139 05/23/2019   K 4.8 05/23/2019   CL 98 05/23/2019   CO2 28 05/23/2019   GLUCOSE 152 (H) 05/23/2019   BUN 19 05/23/2019   CREATININE 1.00 05/23/2019   CALCIUM 9.4 05/23/2019   MG 2.2 05/23/2019   Lab Results  Component Value Date   INR 1.4 (H) 05/18/2017   Lab Results  Component Value Date   CHOL 131 08/10/2018   HDL 39 (L) 08/10/2018   LDLCALC 74 08/10/2018   TRIG 91 08/10/2018     GEN- The patient is well appearing, alert and oriented x 3 today.   Head- normocephalic, atraumatic Eyes-  Sclera clear, conjunctiva pink Ears- hearing intact Oropharynx- clear Neck- supple, no JVP Lymph- no cervical lymphadenopathy Lungs- Clear to ausculation bilaterally, normal work of breathing Heart-  irregular rate and rhythm, no murmurs, rubs or gallops, PMI not laterally displaced GI- soft, NT, ND, + BS Extremities- no clubbing, cyanosis, or  edema MS- no significant deformity or atrophy Skin- no rash or lesion Psych- euthymic mood, full affect Neuro- strength and sensation are intact  EKG-  afib at 85 bpm, qrs int 76 ms, qtc 490 ms    Assessment and Plan: 1.Persisitent afib/flutter s/p ablation 08/25/17  Cardioversion x 2, s/p ablation, with ERAF Then admitted for Tikosyn, initially converted but then with ERAF  Now with SR and some rate controlled  afib but pt does not feel the need to have another ablation  Conitnue dofetilide 500 mcg bid  Continue metoprolol 25 mg qd  Continue xarelto  20 mg a daily for CHA2DS2VASc of 2 Bmet/mag today   F/u up with Dr. Tamala Julian in 3 months and afib clinic in 6 months   Hawkins. Amorina Doerr, [facility] [address] [place] [address] [phone]

## 2019-11-01 IMAGING — CT CT HEART MORPH/PULM VEIN W/ CM & W/O CA SCORE
1 series · 2 of 4 positions shown, 3 images · non-contrast
Comparison: None.

CLINICAL DATA: 66-year-old male with persistent atrial fibrillation
scheduled for an ablation.

EXAM:
Cardiac CT/CTA
TECHNIQUE: The patient was scanned on a Siemens Somatom scanner.

[Series 306: (person_name) - pv · portal-venous · 0.32mm/px · 2 of 4 slices shown, 3 images]
[im 2/4  vessel]
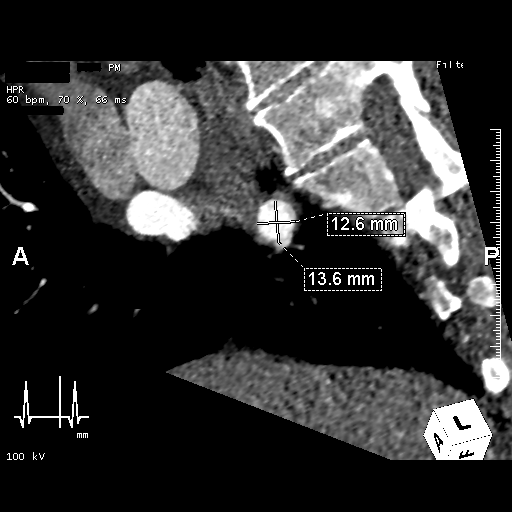
[im 2/4  lung]
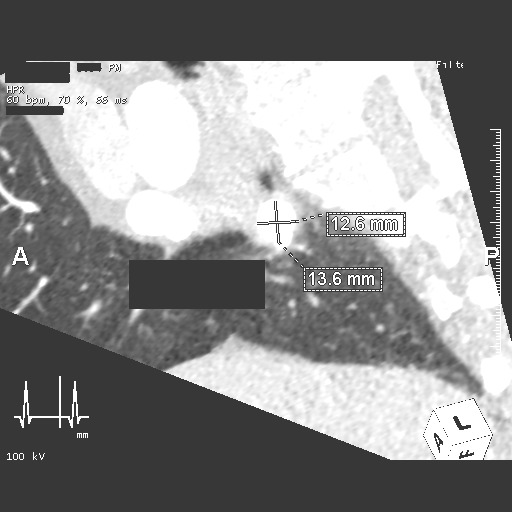
[im 3/4  vessel]
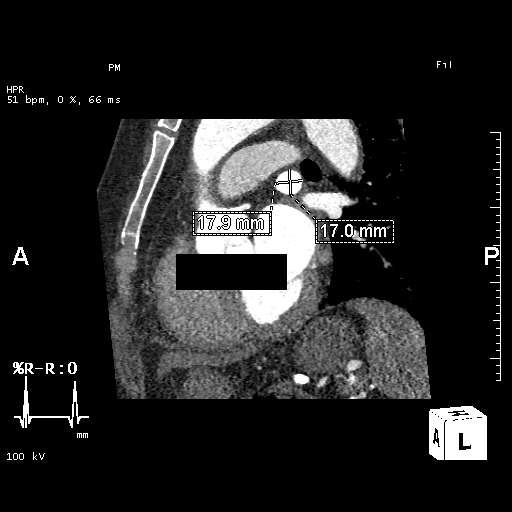

[2 of 4 positions shown; findings below may reference images not displayed]

FINDINGS: A 120 kV prospective scan was triggered in the descending thoracic
aorta at 111 HU's. Gantry rotation speed was 280 msecs and
collimation was .9 mm. No beta blockade and no NTG was given. The 3D
data set was reconstructed in 5% intervals of the 60-80 % of the R-R
cycle. Diastolic phases were analyzed on a dedicated work station
using MPR, MIP and VRT modes. The patient received 80 cc of
contrast.

There is normal pulmonary vein drainage into the left atrium (2 on
the right and 2 on the left) with ostial measurements as follows:

RUPV: 19.6 x 14.1 mm

RLPV: 13.6 x 12.6 mm

LUPV: 17.9 x 17.0 mm

LLPV: 25.9 x 19.4 mm

The left atrial appendage is large - mixed chicken wing / broccoli
type with two major lobes. There is no thrombus in the left atrial
appendage.

The esophagus runs to the left from the left atrial midline and is
in the proximity to the LLPV.

Aorta: Normal caliber. No dissection, mild diffuse calcifications
and atherosclerotic plaque.

Aortic Valve:  Trileaflet.  No calcifications.

Coronary Arteries: Normal coronary origin. left dominance. The study
was performed without use of NTG and insufficient for plaque
evaluation.
IMPRESSION: 1. There is normal pulmonary vein drainage into the left atrium.

2. The left atrial appendage is large - mixed chicken wing /
broccoli type with two major lobes. There is no thrombus in the left
atrial appendage.

3. The esophagus runs to the left from the left atrial midline and
is in the proximity to the LLPV.

4. Normal coronary origin. left dominance. The study was performed
without use of NTG and insufficient for plaque evaluation. Calcium
score is 247 that represents 71 percentile for age/sex.

5. Mildly dilated pulmonary artery measuring 31 mm.

EXAM:
OVER-READ INTERPRETATION  CT CHEST

The following report is an over-read performed by radiologist Dr.
Aleja Natareno [REDACTED] on 08/20/2017. This
over-read does not include interpretation of cardiac or coronary
anatomy or pathology. The coronary calcium score/coronary CTA
interpretation by the cardiologist is attached.
FINDINGS: Aortic atherosclerosis. Multiple small pulmonary nodules noted in
the right lung, the largest of which measured up to 7 x 5 mm (mean
diameter 6 mm) in the right middle lobe associated with the minor
fissure (axial image 24 of series 11). Within the visualized
portions of the thorax there were no areas of acute consolidative
airspace disease, no pleural effusions, no pneumothorax and no
lymphadenopathy. Visualized portions of the upper abdomen
demonstrate a 2 cm low-attenuation lesion in segment 2 of the liver,
which is compatible with a simple cysts. There are no aggressive
appearing lytic or blastic lesions noted in the visualized portions
of the skeleton.
IMPRESSION: 1. Multiple small pulmonary nodules scattered throughout the lungs
bilaterally, largest of which has a mean diameter of 6 mm in the
right middle lobe. Non-contrast chest CT at 3-6 months is
recommended. If the nodules are stable at time of repeat CT, then
future CT at 18-24 months (from today's scan) is considered optional
for low-risk patients, but is recommended for high-risk patients.
This recommendation follows the consensus statement: Guidelines for
Management of Incidental Pulmonary Nodules Detected on CT Images:
2.  Aortic Atherosclerosis ([id number]).

## 2019-11-22 NOTE — Progress Notes (Signed)
Cardiology Office Note:    Date:  11/26/2019   ID:  Anthony Cole, DOB [birth year], MRN [id number]  PCP:  Anthony Floro, MD  Cardiologist:  Anthony Noe, MD   Referring MD: Anthony Floro, MD   Chief Complaint  Patient presents with  . Atrial Fibrillation    History of Present Illness:    Anthony Cole is a 69 y.o. male with a hx of obstructive sleep apneaon CPAP, hypertension, hyperlipidemia,CAD with coronary calcium score 250, diabetes mellitusII, paroxysmal atrial fibrillations/p ablation 08/2017 & recurrent AFIB --> Tykosyn10/07/2017. Repeat ablation discussed but not accepted.    Anthony Cole cannot tell if he is in atrial fibrillation or not.  He does not monitor his rhythm because it causes stress and anxiety.  He has taken Internet and cable TV access out of his house because of anxiety and compulsive interaction when present.  He does not on a smart phone.  Being disconnected in this way helps to destress and he has more time to consider his health with exercise and through reading.  He denies angina, syncope, palpitations, lower extremity edema.  Past Medical History:  Diagnosis Date  . Arthritis   . Atrial fibrillation (HCC)   . Diabetes mellitus without complication (HCC)    type 2  . Hyperlipidemia   . Hypertension   . Hypothyroidism   . Obesity (BMI 30-39.9) 01/25/2016  . OSA on CPAP 07/16/2014   Moderate with AHI 19/hr on CPAP at 8cm H2O  . Pre-diabetes   . Visit for monitoring Tikosyn therapy 10/25/2017    Past Surgical History:  Procedure Laterality Date  . ATRIAL FIBRILLATION ABLATION N/A 08/25/2017   Procedure: ATRIAL FIBRILLATION ABLATION;  Surgeon: Hillis Range, MD;  Location: [facility] INVASIVE CV LAB;  Service: Cardiovascular;  Laterality: N/A;  . CARDIOVERSION  11/04/2011   Procedure: CARDIOVERSION;  Surgeon: Anthony Noe, MD;  Location: [facility] OR;  Service: Cardiovascular;  Laterality: N/A;  . CARDIOVERSION N/A 07/02/2013   Procedure:  CARDIOVERSION;  Surgeon: Anthony Noe, MD;  Location: [facility] ENDOSCOPY;  Service: Cardiovascular;  Laterality: N/A;  10:07 elective cardioversion Lido 40mg , Propofol 60mg ,IV...200 joules, synched, pt presently in A flutter,...cardioverted to NSR...  . CARDIOVERSION N/A 09/19/2013   Procedure: CARDIOVERSION;  Surgeon: , MD;  Location: [facility] ENDOSCOPY;  Service: Cardiovascular;  Laterality: N/A;  . CARDIOVERSION N/A 08/13/2014   Procedure: CARDIOVERSION;  Surgeon: [facility], MD;  Location: [facility] ENDOSCOPY;  Service: Cardiovascular;  Laterality: N/A;  . CARDIOVERSION N/A 05/23/2017   Procedure: CARDIOVERSION;  Surgeon: [facility], MD;  Location: [facility] ENDOSCOPY;  Service: Cardiovascular;  Laterality: N/A;  . CARDIOVERSION N/A 09/09/2017   Procedure: CARDIOVERSION;  Surgeon: [facility], MD;  Location: [facility] ENDOSCOPY;  Service: Cardiovascular;  Laterality: N/A;  . CARDIOVERSION N/A 09/30/2017   Procedure: CARDIOVERSION;  Surgeon: [facility], MD;  Location: [facility] ENDOSCOPY;  Service: Cardiovascular;  Laterality: N/A;  . EYE SURGERY    . FINGER FRACTURE SURGERY Left "when I was a kid"  . FRACTURE SURGERY    . KNEE ARTHROSCOPY W/ DEBRIDEMENT Bilateral   . RETINAL LASER PROCEDURE Bilateral    "tears on both; not detachments"  . SHOULDER ARTHROSCOPY W/ ROTATOR CUFF REPAIR Bilateral   . STAPEDES SURGERY Right   . TONSILLECTOMY    . TYMPANOPLASTY Bilateral    "skin grafts to ear drums"    Current Medications: Current Meds  Medication Sig  . acyclovir (ZOVIRAX) 200  MG capsule Take 200 mg by mouth 2 (two) times daily as needed (first sign of fever blister).   . Cholecalciferol (VITAMIN D3 PO) Take 5,000 Units by mouth daily.   [name] desonide (DESOWEN) 0.05 % lotion Apply 1 application topically daily as needed (dry skin).  [name] diltiazem (CARDIZEM) 30 MG tablet Take 1 tablet every 4 hours AS NEEDED for AFIB heart rate >100 as long as top number of blood pressure >100.  [name] dofetilide  (TIKOSYN) 500 MCG capsule TAKE (1) CAPSULE TWICE DAILY.  [name] doxycycline (VIBRA-TABS) 100 MG tablet Take 100 mg by mouth in the morning and at bedtime.   [name] levothyroxine (SYNTHROID, LEVOTHROID) 88 MCG tablet Take 88 mcg by mouth daily before breakfast.   . losartan (COZAAR) 100 MG tablet Take 1 tablet (100 mg total) by mouth daily.  . metoprolol succinate (TOPROL-XL) 25 MG 24 hr tablet TAKE 1 TABLET ONCE DAILY.  . Multiple Vitamins-Minerals (PRESERVISION AREDS 2+MULTI VIT PO) Take 1 capsule by mouth 2 (two) times daily.  . potassium chloride SA (KLOR-CON) 20 MEQ tablet TAKE 1 TABLET BY MOUTH TWICE DAILY.  . rivaroxaban (XARELTO) 20 MG TABS tablet TAKE 1 TABLET ONCE DAILY IN THE EVENING.  . rosuvastatin (CRESTOR) 20 MG tablet TAKE 1 TABLET ONCE DAILY.   Current Facility-Administered Medications for the 11/26/19 encounter (Office Visit) with Belva Crome, MD  Medication  . 0.9 %  sodium chloride infusion  . sodium chloride flush (NS) 0.9 % injection 3 mL  . sodium chloride flush (NS) 0.9 % injection 3 mL     Allergies:   Gabapentin and Other   Social History   Socioeconomic History  . Marital status: Single    Spouse name: Not on file  . Number of children: Not on file  . Years of education: Not on file  . Highest education level: Not on file  Occupational History  . Not on file  Tobacco Use  . Smoking status: Never Smoker  . Smokeless tobacco: Never Used  Vaping Use  . Vaping Use: Never used  Substance and Sexual Activity  . Alcohol use: No  . Drug use: No  . Sexual activity: Not Currently  Other Topics Concern  . Not on file  Social History Narrative  . Not on file   Social Determinants of Health   Financial Resource Strain:   . Difficulty of Paying Living Expenses:   Food Insecurity:   . Worried About Charity fundraiser in the Last Year:   . Arboriculturist in the Last Year:   Transportation Needs:   . Film/video editor (Medical):   [name] Lack of Transportation  (Non-Medical):   Physical Activity:   . Days of Exercise per Week:   . Minutes of Exercise per Session:   Stress:   . Feeling of Stress :   Social Connections:   . Frequency of Communication with Friends and Family:   . Frequency of Social Gatherings with Friends and Family:   . Attends Religious Services:   . Active Member of Clubs or Organizations:   . Attends Archivist Meetings:   [name] Marital Status:      Family History: The patient's family history includes Hypertension in his mother; Stroke in his maternal grandmother.  ROS:   Please see the history of present illness.    Not had blood in his urine or stool.  Denies melena.  No transient neurological complaints.  All other systems reviewed and are  negative.  EKGs/Labs/Other Studies Reviewed:    The following studies were reviewed today: No recent cardiac imaging.  EKG:  EKG electrocardiogram performed in March revealed atrial fib/flutter with controlled rate  Recent Labs: 11/29/2018: Hemoglobin 15.2; Platelets 264 08/14/2019: BUN 23; Creatinine, Ser 0.94; Magnesium 2.3; Potassium 4.7; Sodium 145  Recent Lipid Panel    Component Value Date/Time   CHOL 131 08/10/2018 0926   TRIG 91 08/10/2018 0926   HDL 39 (L) 08/10/2018 0926   CHOLHDL 3.4 08/10/2018 0926   LDLCALC 74 08/10/2018 0926    Physical Exam:    VS:  BP (!) 142/86   Pulse 81   Ht 5\' 9"  (1.753 m)   Wt 174 lb 1.9 oz (79 kg)   SpO2 98%   BMI 25.71 kg/m     Wt Readings from Last 3 Encounters:  11/26/19 174 lb 1.9 oz (79 kg)  08/14/19 175 lb 6.4 oz (79.6 kg)  05/16/19 172 lb 12.8 oz (78.4 kg)     GEN: Healthy in appearance. No acute distress HEENT: Normal NECK: No JVD. LYMPHATICS: No lymphadenopathy CARDIAC: Irregularly irregular RR without murmur, gallop, or edema. VASCULAR:  Normal Pulses. No bruits. RESPIRATORY:  Clear to auscultation without rales, wheezing or rhonchi  ABDOMEN: Soft, non-tender, non-distended, No pulsatile  mass, MUSCULOSKELETAL: No deformity  SKIN: Warm and dry NEUROLOGIC:  Alert and oriented x 3 PSYCHIATRIC:  Normal affect   ASSESSMENT:    1. Paroxysmal atrial fibrillation (HCC)   2. Coronary artery calcification seen on CAT scan   3. Essential hypertension   4. OSA (obstructive sleep apnea)   5. Long term current use of anticoagulant therapy   6. Educated about COVID-19 virus infection   7. Hyperlipidemia with target LDL less than 70    PLAN:    In order of problems listed above:  1. Atrial fibrillation with controlled rate.  Continue dofetilide metoprolol and rivaroxaban.  Chads Vascular score is greater than 2 (CAD, hypertension,, diabetes mellitus...).  Consider discontinuation of dofetilide if a repeat monitor sometime in the next 6 to 8 months demonstrates continuous atrial fibrillation. 2. No symptoms of angina.  Statin therapy for LDL lowering is appropriate. 3. Target blood pressure 130/80.  Reduction in sodium intake is encouraged.  Consider increasing metoprolol dose if blood pressure continues to run above 140 systolic and diastolic pressure above 80 mmHg. 4. He is compliant with CPAP.  Sleeping relatively well since disconnecting from cable TV and Internet service. 5. Monitor for blood in the stool.  Continue Xarelto.  Look for evidence of blood in the stool. 6. Covid vaccine has been accepted.  Still practicing social distancing. 7. Continue rosuvastatin and check lipid panel in 6 months.  Clinical follow-up in 6 months.   Medication Adjustments/Labs and Tests Ordered: Current medicines are reviewed at length with the patient today.  Concerns regarding medicines are outlined above.  No orders of the defined types were placed in this encounter.  No orders of the defined types were placed in this encounter.   Patient Instructions  Medication Instructions:  Your physician recommends that you continue on your current medications as directed. Please refer to the  Current Medication list given to you today.  *If you need a refill on your cardiac medications before your next appointment, please call your pharmacy*   Lab Work: None If you have labs (blood work) drawn today and your tests are completely normal, you will receive your results only by: 14/09/20 MyChart Message (if you have  MyChart) OR . A paper copy in the mail If you have any lab test that is abnormal or we need to change your treatment, we will call you to review the results.   Testing/Procedures: None   Follow-Up: At [facility], you and your health needs are our priority.  As part of our continuing mission to provide you with exceptional heart care, we have created designated Provider Care Teams.  These Care Teams include your primary Cardiologist (physician) and Advanced Practice Providers (APPs -  Physician Assistants and Nurse Practitioners) who all work together to provide you with the care you need, when you need it.  We recommend signing up for the patient portal called "MyChart".  Sign up information is provided on this After Visit Summary.  MyChart is used to connect with patients for Virtual Visits (Telemedicine).  Patients are able to view lab/test results, encounter notes, upcoming appointments, etc.  Non-urgent messages can be sent to your provider as well.   To learn more about what you can do with MyChart, go to [email or web address].    Your next appointment:   6 month(s)  The format for your next appointment:   In Person  Provider:   You may see Anthony Noe, MD or one of the following Advanced Practice Providers on your designated Care Team:    Norma Fredrickson, NP  Nada Boozer, NP  Georgie Chard, NP    Other Instructions      Signed, Anthony Noe, MD  11/26/2019 9:17 AM    [facility]

## 2019-11-26 ENCOUNTER — Other Ambulatory Visit: Payer: Self-pay

## 2019-11-26 ENCOUNTER — Encounter: Payer: Self-pay | Admitting: Interventional Cardiology

## 2019-11-26 ENCOUNTER — Ambulatory Visit: Payer: Medicare PPO | Admitting: Interventional Cardiology

## 2019-11-26 VITALS — BP 142/86 | HR 81 | Ht 69.0 in | Wt 174.1 lb

## 2019-11-26 DIAGNOSIS — G4733 Obstructive sleep apnea (adult) (pediatric): Secondary | ICD-10-CM

## 2019-11-26 DIAGNOSIS — I2510 Atherosclerotic heart disease of native coronary artery without angina pectoris: Secondary | ICD-10-CM | POA: Diagnosis not present

## 2019-11-26 DIAGNOSIS — Z7901 Long term (current) use of anticoagulants: Secondary | ICD-10-CM

## 2019-11-26 DIAGNOSIS — E785 Hyperlipidemia, unspecified: Secondary | ICD-10-CM

## 2019-11-26 DIAGNOSIS — I480 Paroxysmal atrial fibrillation: Secondary | ICD-10-CM | POA: Diagnosis not present

## 2019-11-26 DIAGNOSIS — I10 Essential (primary) hypertension: Secondary | ICD-10-CM | POA: Diagnosis not present

## 2019-11-26 DIAGNOSIS — Z7189 Other specified counseling: Secondary | ICD-10-CM

## 2019-11-26 NOTE — Patient Instructions (Signed)

## 2019-11-28 ENCOUNTER — Other Ambulatory Visit (HOSPITAL_COMMUNITY): Payer: Self-pay | Admitting: Nurse Practitioner

## 2019-12-14 ENCOUNTER — Other Ambulatory Visit: Payer: Self-pay | Admitting: Family Medicine

## 2019-12-14 ENCOUNTER — Other Ambulatory Visit (HOSPITAL_COMMUNITY): Payer: Self-pay | Admitting: Nurse Practitioner

## 2019-12-14 DIAGNOSIS — R9389 Abnormal findings on diagnostic imaging of other specified body structures: Secondary | ICD-10-CM

## 2019-12-25 ENCOUNTER — Other Ambulatory Visit: Payer: Self-pay

## 2019-12-25 ENCOUNTER — Ambulatory Visit
Admission: RE | Admit: 2019-12-25 | Discharge: 2019-12-25 | Disposition: A | Discharge status: Home / Self Care | Payer: Medicare PPO | Source: Ambulatory Visit | Attending: Family Medicine | Admitting: Family Medicine

## 2019-12-25 DIAGNOSIS — R9389 Abnormal findings on diagnostic imaging of other specified body structures: Secondary | ICD-10-CM

## 2020-01-12 ENCOUNTER — Other Ambulatory Visit: Payer: Self-pay | Admitting: Cardiology

## 2020-02-08 DIAGNOSIS — I482 Chronic atrial fibrillation, unspecified: Secondary | ICD-10-CM | POA: Diagnosis not present

## 2020-02-08 DIAGNOSIS — H73893 Other specified disorders of tympanic membrane, bilateral: Secondary | ICD-10-CM | POA: Diagnosis not present

## 2020-02-08 DIAGNOSIS — Z9889 Other specified postprocedural states: Secondary | ICD-10-CM | POA: Diagnosis not present

## 2020-02-08 DIAGNOSIS — H906 Mixed conductive and sensorineural hearing loss, bilateral: Secondary | ICD-10-CM | POA: Diagnosis not present

## 2020-02-08 DIAGNOSIS — H90A31 Mixed conductive and sensorineural hearing loss, unilateral, right ear with restricted hearing on the contralateral side: Secondary | ICD-10-CM | POA: Diagnosis not present

## 2020-02-08 DIAGNOSIS — Z7901 Long term (current) use of anticoagulants: Secondary | ICD-10-CM | POA: Diagnosis not present

## 2020-02-13 ENCOUNTER — Encounter (HOSPITAL_COMMUNITY): Payer: Self-pay | Admitting: Nurse Practitioner

## 2020-02-13 ENCOUNTER — Other Ambulatory Visit: Payer: Self-pay

## 2020-02-13 ENCOUNTER — Ambulatory Visit (HOSPITAL_COMMUNITY)
Admission: RE | Admit: 2020-02-13 | Discharge: 2020-02-13 | Disposition: A | Discharge status: Home / Self Care | Payer: Medicare PPO | Source: Ambulatory Visit | Attending: Nurse Practitioner | Admitting: Nurse Practitioner

## 2020-02-13 VITALS — BP 156/94 | HR 93 | Ht 69.0 in | Wt 168.0 lb

## 2020-02-13 DIAGNOSIS — Z79890 Hormone replacement therapy (postmenopausal): Secondary | ICD-10-CM | POA: Diagnosis not present

## 2020-02-13 DIAGNOSIS — Z8249 Family history of ischemic heart disease and other diseases of the circulatory system: Secondary | ICD-10-CM | POA: Diagnosis not present

## 2020-02-13 DIAGNOSIS — E669 Obesity, unspecified: Secondary | ICD-10-CM | POA: Insufficient documentation

## 2020-02-13 DIAGNOSIS — I10 Essential (primary) hypertension: Secondary | ICD-10-CM | POA: Diagnosis not present

## 2020-02-13 DIAGNOSIS — E785 Hyperlipidemia, unspecified: Secondary | ICD-10-CM | POA: Diagnosis not present

## 2020-02-13 DIAGNOSIS — I4819 Other persistent atrial fibrillation: Secondary | ICD-10-CM | POA: Insufficient documentation

## 2020-02-13 DIAGNOSIS — D6869 Other thrombophilia: Secondary | ICD-10-CM

## 2020-02-13 DIAGNOSIS — I4892 Unspecified atrial flutter: Secondary | ICD-10-CM | POA: Diagnosis not present

## 2020-02-13 DIAGNOSIS — E039 Hypothyroidism, unspecified: Secondary | ICD-10-CM | POA: Insufficient documentation

## 2020-02-13 DIAGNOSIS — Z79899 Other long term (current) drug therapy: Secondary | ICD-10-CM | POA: Insufficient documentation

## 2020-02-13 DIAGNOSIS — E119 Type 2 diabetes mellitus without complications: Secondary | ICD-10-CM | POA: Insufficient documentation

## 2020-02-13 DIAGNOSIS — M1990 Unspecified osteoarthritis, unspecified site: Secondary | ICD-10-CM | POA: Insufficient documentation

## 2020-02-13 DIAGNOSIS — G4733 Obstructive sleep apnea (adult) (pediatric): Secondary | ICD-10-CM | POA: Diagnosis not present

## 2020-02-13 LAB — CBC
HCT: 44.8 % (ref 39.0–52.0)
Hemoglobin: 14.9 g/dL (ref 13.0–17.0)
MCH: 31.2 pg (ref 26.0–34.0)
MCHC: 33.3 g/dL (ref 30.0–36.0)
MCV: 93.9 fL (ref 80.0–100.0)
Platelets: 244 10*3/uL (ref 150–400)
RBC: 4.77 MIL/uL (ref 4.22–5.81)
RDW: 13.2 % (ref 11.5–15.5)
WBC: 6.7 10*3/uL (ref 4.0–10.5)
nRBC: 0 % (ref 0.0–0.2)

## 2020-02-13 LAB — BASIC METABOLIC PANEL
Anion gap: 9 (ref 5–15)
BUN: 17 mg/dL (ref 8–23)
CO2: 29 mmol/L (ref 22–32)
Calcium: 9.7 mg/dL (ref 8.9–10.3)
Chloride: 100 mmol/L (ref 98–111)
Creatinine, Ser: 1.1 mg/dL (ref 0.61–1.24)
GFR calc Af Amer: >60 mL/min (ref >60)
GFR calc non Af Amer: >60 mL/min (ref >60)
Glucose, Bld: 161 mg/dL — ABNORMAL HIGH (ref 70–99)
Potassium: 4.1 mmol/L (ref 3.5–5.1)
Sodium: 138 mmol/L (ref 135–145)

## 2020-02-13 LAB — MAGNESIUM: Magnesium: 2.1 mg/dL (ref 1.7–2.4)

## 2020-02-13 NOTE — Progress Notes (Addendum)
Primary Care Physician: Daisy Floro, MD Referring Physician: Dr. Johney Frame Cardiologist: Dr. Mendel Ryder    Anthony Cole is a 69 y.o. male with a h/o afib s/p ablation 08/25/17. He was being seen in the office as pt has noted return of afib since 2 days after ablation. He felt  he was tolerating his afib better since ablation. He was instructed to increase the metoprolol and his was rate controlled. He denied any swallowing difficulties and had minor bruising at the rt groin site. He was set up for successful cardioversion.   He returns to afib clinic, unfortunately has returned to afib. He believes he held in normal rhythm for about a week. No known trigger. Continues to take flecainde 100 mg bid, and xarelto, no missed doses.   F/u in afib clinic 5/10. Remains in atrial flutter at 84 bpm  He called in last week stating that the 2nd  cardioversion did not hold in SR. Discussed with Dr.Allred and he said to offer pt short term amiodarone or Tikosyn. We discussed this in clinic in detail and he believes he would rather try Tikosyn. Seems to be tolerating a flutter well.  F/u in afib clinic, 5/31. He was admitted for Tikosyn, and did convert to SR but returned to afib prior to d/c. The plan on d/c was if he continued in afib to cardiovert on tikosyn and see if he would maintain SR. Pt is in agreement. He is taking tikosyn and xareoto without missed doses.  F/u in afib clinic 02/14/18. He was in a MD office recently and irregular HB was noted. He is found to be in afib today. He has noticed some palpitations at times. He is tired when he is in afib, and tired when he is in SR.  Options discussed to restore SR.    F/u in afib clinic 06/14/2018. He is having some afib but overall is feeling weill, exercising and carrying on with life. He has been offered another ablation with Dr.Allred but does not know if he is ready for this. He continues on [place].  F/u in afib clinic, 08/14/19. He is out of  rhythm today but did not know until it was brought to his attention. He feels that he goes in and out. Much more tolerable of it after ablation and tikoysn. He has had both covid shots and feels well today. Goes to the gym on a regular basis. Continues  with xarelto 20 mg qd for a CHA2DS2VASc score of 3.  F/u in afib clinic, 02/13/20. He is in atrial flutter today. He feels he may be out of rhythm more since July. He wore a monitor last June that showed 32% afib burden. He does not feel the afib that he use to. He continues on Tikosyn. No bleeding issues.    Today, he denies symptoms of palpitations, chest pain, shortness of breath, orthopnea, PND, lower extremity edema, dizziness, presyncope, syncope, or neurologic sequela.+ fatigue. The patient is tolerating medications without difficulties and is otherwise without complaint today.   Past Medical History:  Diagnosis Date  . Arthritis   . Atrial fibrillation (HCC)   . Diabetes mellitus without complication (HCC)    type 2  . Hyperlipidemia   . Hypertension   . Hypothyroidism   . Obesity (BMI 30-39.9) 01/25/2016  . OSA on CPAP 07/16/2014   Moderate with AHI 19/hr on CPAP at 8cm H2O  . Pre-diabetes   . Visit for monitoring Tikosyn therapy 10/25/2017  Past Surgical History:  Procedure Laterality Date  . ATRIAL FIBRILLATION ABLATION N/A 08/25/2017   Procedure: ATRIAL FIBRILLATION ABLATION;  Surgeon: Hillis RangeAllred, James, MD;  Location: [facility];  Service: Cardiovascular;  Laterality: N/A;  . CARDIOVERSION  11/04/2011   Procedure: CARDIOVERSION;  Surgeon: Lesleigh NoeHenry W Smith III, MD;  [facility];  Service: Cardiovascular;  Laterality: N/A;  . CARDIOVERSION N/A 07/02/2013   Procedure: CARDIOVERSION;  Surgeon: Lesleigh NoeHenry W Smith III, MD;  [facility];  Service: Cardiovascular;  Laterality: N/A;  10:07 elective cardioversion Lido 40mg , Propofol 60mg ,IV...200 joules, synched, pt presently in A flutter,...cardioverted to NSR...  . CARDIOVERSION  N/A 09/19/2013   Procedure: CARDIOVERSION;  Surgeon: Lesleigh NoeHenry W Smith III, MD;  [facility];  Service: Cardiovascular;  Laterality: N/A;  . CARDIOVERSION N/A 08/13/2014   Procedure: CARDIOVERSION;  Surgeon: Lars MassonKatarina H Nelson, MD;  [facility];  Service: Cardiovascular;  Laterality: N/A;  . CARDIOVERSION N/A 05/23/2017   Procedure: CARDIOVERSION;  Surgeon: Vesta MixerNahser, Philip J, MD;  [facility];  Service: Cardiovascular;  Laterality: N/A;  . CARDIOVERSION N/A 09/09/2017   Procedure: CARDIOVERSION;  Surgeon: Pricilla Riffleoss, Paula V, MD;  [facility];  Service: Cardiovascular;  Laterality: N/A;  . CARDIOVERSION N/A 09/30/2017   Procedure: CARDIOVERSION;  Surgeon: Laqueta LindenKoneswaran, Suresh A, MD;  [facility];  Service: Cardiovascular;  Laterality: N/A;  . EYE SURGERY    . FINGER FRACTURE SURGERY Left "when I was a kid"  . FRACTURE SURGERY    . KNEE ARTHROSCOPY W/ DEBRIDEMENT Bilateral   . RETINAL LASER PROCEDURE Bilateral    "tears on both; not detachments"  . SHOULDER ARTHROSCOPY W/ ROTATOR CUFF REPAIR Bilateral   . STAPEDES SURGERY Right   . TONSILLECTOMY    . TYMPANOPLASTY Bilateral    "skin grafts to ear drums"    Current Outpatient Medications  Medication Sig Dispense Refill  . acyclovir (ZOVIRAX) 200 MG capsule Take 200 mg by mouth 2 (two) times daily as needed (first sign of fever blister).     . Cholecalciferol (VITAMIN D3 PO) Take 5,000 Units by mouth daily.     [name]. diltiazem (CARDIZEM) 30 MG tablet Take 1 tablet every 4 hours AS NEEDED for AFIB heart rate >100 as long as top number of blood pressure >100. 45 tablet 1  . dofetilide (TIKOSYN) 500 MCG capsule TAKE (1) CAPSULE TWICE DAILY. 180 capsule 1  . doxycycline (VIBRA-TABS) 100 MG tablet Take 100 mg by mouth in the morning and at bedtime.     [name]. levothyroxine (SYNTHROID, LEVOTHROID) 88 MCG tablet Take 88 mcg by mouth daily before breakfast.     . losartan (COZAAR) 100 MG tablet Take 1 tablet (100 mg  total) by mouth daily. 90 tablet 3  . metoprolol succinate (TOPROL-XL) 25 MG 24 hr tablet TAKE 1 TABLET ONCE DAILY. 90 tablet 3  . Multiple Vitamins-Minerals (PRESERVISION AREDS 2+MULTI VIT PO) Take 1 capsule by mouth 2 (two) times daily.    . potassium chloride SA (KLOR-CON) 20 MEQ tablet Take 1 tablet (20 mEq total) by mouth 2 (two) times daily. Appointment Required For Further Refills [phone] 60 tablet 2  . rivaroxaban (XARELTO) 20 MG TABS tablet TAKE 1 TABLET ONCE DAILY IN THE EVENING. 90 tablet 2  . rosuvastatin (CRESTOR) 20 MG tablet TAKE 1 TABLET ONCE DAILY. 90 tablet 3  . tretinoin (RETIN-A) 0.025 % cream Apply 1 application topically at bedtime.    [name]. VALTREX 1 g tablet Take by mouth as needed.  Current Facility-Administered Medications  Medication Dose Route Frequency Provider Last Rate Last Admin  . 0.9 %  sodium chloride infusion  250 mL Intravenous Continuous [name], MD      . sodium chloride flush (NS) 0.9 % injection 3 mL  3 mL Intravenous Q12H [name], MD      . sodium chloride flush (NS) 0.9 % injection 3 mL  3 mL Intravenous PRN [name], MD        Allergies  Allergen Reactions  . Gabapentin Other (See Comments)    Causes Extreme sedation   . Other     Not sure of name of med-for overactive bladder-caused some adverse reactions, stopped taking immediately-per patient    Social History   Socioeconomic History  . Marital status: Single    Spouse name: Not on file  . Number of children: Not on file  . Years of education: Not on file  . Highest education level: Not on file  Occupational History  . Not on file  Tobacco Use  . Smoking status: Never Smoker  . Smokeless tobacco: Never Used  Vaping Use  . Vaping Use: Never used  Substance and Sexual Activity  . Alcohol use: No  . Drug use: No  . Sexual activity: Not Currently  Other Topics Concern  . Not on file  Social History Narrative  . Not on file   Social Determinants of  Health   Financial Resource Strain:   . Difficulty of Paying Living Expenses: Not on file  Food Insecurity:   . Worried About Programme researcher, broadcasting/film/video in the Last Year: Not on file  . Ran Out of Food in the Last Year: Not on file  Transportation Needs:   . Lack of Transportation (Medical): Not on file  . Lack of Transportation (Non-Medical): Not on file  Physical Activity:   . Days of Exercise per Week: Not on file  . Minutes of Exercise per Session: Not on file  Stress:   . Feeling of Stress : Not on file  Social Connections:   . Frequency of Communication with Friends and Family: Not on file  . Frequency of Social Gatherings with Friends and Family: Not on file  . Attends Religious Services: Not on file  . Active Member of Clubs or Organizations: Not on file  . Attends Banker Meetings: Not on file  . Marital Status: Not on file  Intimate Partner Violence:   . Fear of Current or Ex-Partner: Not on file  . Emotionally Abused: Not on file  . Physically Abused: Not on file  . Sexually Abused: Not on file    Family History  Problem Relation Age of Onset  . Hypertension Mother   . Stroke Maternal Grandmother     ROS- All systems are reviewed and negative except as per the HPI above  Physical Exam: Vitals:   02/13/20 1005  BP: (!) 156/94  Pulse: 93  Weight: 76.2 kg  Height: 5\' 9"  (1.753 m)   Wt Readings from Last 3 Encounters:  02/13/20 76.2 kg  11/26/19 79 kg  08/14/19 79.6 kg    Labs: Lab Results  Component Value Date   NA 145 08/14/2019   K 4.7 08/14/2019   CL 107 08/14/2019   CO2 26 08/14/2019   GLUCOSE 122 (H) 08/14/2019   BUN 23 08/14/2019   CREATININE 0.94 08/14/2019   CALCIUM 9.9 08/14/2019   MG 2.3 08/14/2019   Lab Results  Component  Value Date   INR 1.4 (H) 05/18/2017   Lab Results  Component Value Date   CHOL 131 08/10/2018   HDL 39 (L) 08/10/2018   LDLCALC 74 08/10/2018   TRIG 91 08/10/2018     GEN- The patient is well  appearing, alert and oriented x 3 today.   Head- normocephalic, atraumatic Eyes-  Sclera clear, conjunctiva pink Ears- hearing intact Oropharynx- clear Neck- supple, no JVP Lymph- no cervical lymphadenopathy Lungs- Clear to ausculation bilaterally, normal work of breathing Heart- irregular rate and rhythm, no murmurs, rubs or gallops, PMI not laterally displaced GI- soft, NT, ND, + BS Extremities- no clubbing, cyanosis, or edema MS- no significant deformity or atrophy Skin- no rash or lesion Psych- euthymic mood, full affect Neuro- strength and sensation are intact  EKG-  atrial flutter at 93 bpm, qt interval at 467 ms    Assessment and Plan: 1.Persisitent afib/flutter s/p ablation 08/25/17  Cardioversion x 2, s/p ablation, with ERAF Then admitted for Tikosyn, initially converted but then with ERAF  Now with SR and some rate controlled  afib but pt does not feel the need to have another ablation, feels his burden has increased  Conitnue dofetilide 500 mcg bid  Continue metoprolol 25 mg qd  Continue xarelto  20 mg a daily for CHA2DS2VASc of 2 Bmet/mag/cbc today Update Echo and then will place a 2 week zio patch to determine  burden and if Tikosyn is still effective    Pt will be informed of results of echo/monitor  when available and f/u plan     Lupita Leash C. [facility] [address] [place], [place] [address] [phone]

## 2020-02-18 DIAGNOSIS — Z9889 Other specified postprocedural states: Secondary | ICD-10-CM | POA: Diagnosis not present

## 2020-02-18 DIAGNOSIS — H90A31 Mixed conductive and sensorineural hearing loss, unilateral, right ear with restricted hearing on the contralateral side: Secondary | ICD-10-CM | POA: Diagnosis not present

## 2020-02-18 DIAGNOSIS — H906 Mixed conductive and sensorineural hearing loss, bilateral: Secondary | ICD-10-CM | POA: Diagnosis not present

## 2020-02-19 ENCOUNTER — Telehealth (HOSPITAL_COMMUNITY): Payer: Self-pay | Admitting: *Deleted

## 2020-02-19 NOTE — Telephone Encounter (Signed)
Pt called stating he has decided to hold off on heart monitor for now. He wants to have echo and think about monitor more. Pt will call back when he is ready.

## 2020-02-20 ENCOUNTER — Ambulatory Visit (HOSPITAL_COMMUNITY)
Admission: RE | Admit: 2020-02-20 | Discharge: 2020-02-20 | Disposition: A | Discharge status: Home / Self Care | Payer: Medicare PPO | Source: Ambulatory Visit | Attending: Family Medicine | Admitting: Family Medicine

## 2020-02-20 ENCOUNTER — Encounter (HOSPITAL_COMMUNITY): Payer: Medicare PPO

## 2020-02-20 ENCOUNTER — Ambulatory Visit (HOSPITAL_COMMUNITY)
Admission: RE | Admit: 2020-02-20 | Discharge: 2020-02-20 | Disposition: A | Discharge status: Home / Self Care | Payer: Medicare PPO | Source: Ambulatory Visit | Attending: Nurse Practitioner | Admitting: Nurse Practitioner

## 2020-02-20 ENCOUNTER — Other Ambulatory Visit: Payer: Self-pay

## 2020-02-20 DIAGNOSIS — I119 Hypertensive heart disease without heart failure: Secondary | ICD-10-CM | POA: Insufficient documentation

## 2020-02-20 DIAGNOSIS — I4819 Other persistent atrial fibrillation: Secondary | ICD-10-CM | POA: Insufficient documentation

## 2020-02-20 DIAGNOSIS — E785 Hyperlipidemia, unspecified: Secondary | ICD-10-CM | POA: Diagnosis not present

## 2020-02-20 DIAGNOSIS — E039 Hypothyroidism, unspecified: Secondary | ICD-10-CM | POA: Diagnosis not present

## 2020-02-20 DIAGNOSIS — E119 Type 2 diabetes mellitus without complications: Secondary | ICD-10-CM | POA: Diagnosis not present

## 2020-02-20 LAB — ECHOCARDIOGRAM COMPLETE
Area-P 1/2: 4.39 cm2
S' Lateral: 2.8 cm

## 2020-02-20 NOTE — Progress Notes (Signed)
  Echocardiogram 2D Echocardiogram has been performed.  Anthony Cole Anthony Cole 02/20/2020, 3:03 PM

## 2020-02-26 ENCOUNTER — Telehealth: Payer: Self-pay | Admitting: Interventional Cardiology

## 2020-02-26 NOTE — Telephone Encounter (Signed)
New message:      Patient calling for some results. Please call patient back.

## 2020-02-26 NOTE — Telephone Encounter (Signed)
Spoke with pt who has questions about his echocardiogram results.  Explained to pt his echo shows moderate enlargement of left ventricle (one of the heart chambers)  Due to this finding it is very important for pt's B/P to stay well controlled.  Dr Katrinka Blazing states pt may need further work-up and he has forwarded to pt's PCP.  Pt expresses concern as he states his B/P is not well controlled despite taking medications as prescribed.  Pt advised will forward concern to Dr Katrinka Blazing and RN for further review and additional recommendations.  Pt verbalizes understanding and agrees with current plan.

## 2020-02-27 ENCOUNTER — Encounter: Payer: Self-pay | Admitting: *Deleted

## 2020-02-27 DIAGNOSIS — N281 Cyst of kidney, acquired: Secondary | ICD-10-CM | POA: Diagnosis not present

## 2020-03-01 DIAGNOSIS — Z23 Encounter for immunization: Secondary | ICD-10-CM | POA: Diagnosis not present

## 2020-03-03 DIAGNOSIS — N138 Other obstructive and reflux uropathy: Secondary | ICD-10-CM | POA: Diagnosis not present

## 2020-03-03 DIAGNOSIS — E291 Testicular hypofunction: Secondary | ICD-10-CM | POA: Diagnosis not present

## 2020-03-03 DIAGNOSIS — N281 Cyst of kidney, acquired: Secondary | ICD-10-CM | POA: Diagnosis not present

## 2020-03-03 DIAGNOSIS — N529 Male erectile dysfunction, unspecified: Secondary | ICD-10-CM | POA: Diagnosis not present

## 2020-03-03 DIAGNOSIS — N401 Enlarged prostate with lower urinary tract symptoms: Secondary | ICD-10-CM | POA: Diagnosis not present

## 2020-03-06 ENCOUNTER — Other Ambulatory Visit: Payer: Self-pay

## 2020-03-06 ENCOUNTER — Encounter (INDEPENDENT_AMBULATORY_CARE_PROVIDER_SITE_OTHER): Payer: Medicare PPO | Admitting: Ophthalmology

## 2020-03-06 DIAGNOSIS — H353132 Nonexudative age-related macular degeneration, bilateral, intermediate dry stage: Secondary | ICD-10-CM | POA: Diagnosis not present

## 2020-03-06 DIAGNOSIS — H43813 Vitreous degeneration, bilateral: Secondary | ICD-10-CM | POA: Diagnosis not present

## 2020-03-06 DIAGNOSIS — H2513 Age-related nuclear cataract, bilateral: Secondary | ICD-10-CM | POA: Diagnosis not present

## 2020-03-06 DIAGNOSIS — H35033 Hypertensive retinopathy, bilateral: Secondary | ICD-10-CM

## 2020-03-06 DIAGNOSIS — H33303 Unspecified retinal break, bilateral: Secondary | ICD-10-CM | POA: Diagnosis not present

## 2020-03-06 DIAGNOSIS — H35372 Puckering of macula, left eye: Secondary | ICD-10-CM

## 2020-03-06 DIAGNOSIS — I10 Essential (primary) hypertension: Secondary | ICD-10-CM

## 2020-03-07 NOTE — Telephone Encounter (Signed)
MyChart message sent to pt on 9/22 requesting BP readings but have not heard back.  Called pt today and left message to either call back with BP readings or send them over through MyChart.

## 2020-03-10 ENCOUNTER — Telehealth: Payer: Self-pay | Admitting: Interventional Cardiology

## 2020-03-10 DIAGNOSIS — I10 Essential (primary) hypertension: Secondary | ICD-10-CM

## 2020-03-10 NOTE — Telephone Encounter (Signed)
Patient called stating he was going to drop off his BP reading the he has taken for a week at the front desk.  If you have any question give him a call.

## 2020-03-10 NOTE — Telephone Encounter (Signed)
See phone note from 10/4. 

## 2020-03-10 NOTE — Telephone Encounter (Signed)
    Pt is calling back to let Victorino Dike know he dropped off his BP readings around 12:15 pm today

## 2020-03-10 NOTE — Telephone Encounter (Signed)
Lm for the pt to call back... I received his BP readings and had Med Rec scan them in to Epic.

## 2020-03-12 NOTE — Telephone Encounter (Signed)
BP readings now scanned into system under the media tab.  Should be the first thing in there.  It's labeled AMB New Patient Record.  Will route to Dr. Katrinka Blazing for review.   Pt was given echo results and was concerned due to elevated BPs so he dropped off these readings.

## 2020-03-12 NOTE — Telephone Encounter (Signed)
BP too high. Start Aldactone 12.5 mg daily. BMET 1 week.

## 2020-03-13 ENCOUNTER — Other Ambulatory Visit: Payer: Self-pay | Admitting: Interventional Cardiology

## 2020-03-13 MED ORDER — SPIRONOLACTONE 25 MG PO TABS: 12.5000 mg | ORAL_TABLET | Freq: Every day | ORAL | 3 refills | Status: DC

## 2020-03-13 NOTE — Telephone Encounter (Signed)
Spoke with pt and made him aware of recommendations.  Pt will come for labs on 10/15. Pt verbalized understanding and was in agreement with plan.

## 2020-03-13 NOTE — Telephone Encounter (Signed)
   Pt is returning call to follow up

## 2020-03-18 ENCOUNTER — Other Ambulatory Visit (HOSPITAL_COMMUNITY): Payer: Self-pay | Admitting: Nurse Practitioner

## 2020-03-21 ENCOUNTER — Other Ambulatory Visit: Payer: Medicare PPO | Admitting: *Deleted

## 2020-03-21 ENCOUNTER — Other Ambulatory Visit: Payer: Self-pay

## 2020-03-21 DIAGNOSIS — I10 Essential (primary) hypertension: Secondary | ICD-10-CM | POA: Diagnosis not present

## 2020-03-21 LAB — BASIC METABOLIC PANEL
BUN/Creatinine Ratio: 22 (ref 10–24)
BUN: 24 mg/dL (ref 8–27)
CO2: 27 mmol/L (ref 20–29)
Calcium: 9.8 mg/dL (ref 8.6–10.2)
Chloride: 101 mmol/L (ref 96–106)
Creatinine, Ser: 1.07 mg/dL (ref 0.76–1.27)
GFR calc Af Amer: 82 mL/min/{1.73_m2} (ref >59)
GFR calc non Af Amer: 71 mL/min/{1.73_m2} (ref >59)
Glucose: 118 mg/dL — ABNORMAL HIGH (ref 65–99)
Potassium: 5 mmol/L (ref 3.5–5.2)
Sodium: 141 mmol/L (ref 134–144)

## 2020-03-25 ENCOUNTER — Telehealth: Payer: Self-pay | Admitting: *Deleted

## 2020-03-25 DIAGNOSIS — G4733 Obstructive sleep apnea (adult) (pediatric): Secondary | ICD-10-CM | POA: Diagnosis not present

## 2020-03-25 NOTE — Telephone Encounter (Signed)
Called patient to offer assistance. Left message to call back.

## 2020-03-25 NOTE — Telephone Encounter (Signed)
Patient calling to speak with Coralee North in regards to his settings for his CPAP machine.

## 2020-03-26 ENCOUNTER — Telehealth: Payer: Self-pay

## 2020-03-26 DIAGNOSIS — I10 Essential (primary) hypertension: Secondary | ICD-10-CM

## 2020-03-26 NOTE — Telephone Encounter (Signed)
-----  Message from Belva Crome, MD sent at 03/21/2020  5:43 PM EDT ----- Let the patient know the potassium is high normal.  He should repeat a be met in 1 month. A copy will be sent to Lawerance Cruel, MD

## 2020-03-26 NOTE — Telephone Encounter (Signed)
Reviewed results with patient who verbalized understanding.   Repeat BMET scheduled 11/17. He was grateful for call and agrees with treatment plan.

## 2020-04-23 ENCOUNTER — Other Ambulatory Visit: Payer: Self-pay

## 2020-04-23 ENCOUNTER — Other Ambulatory Visit: Payer: Medicare PPO | Admitting: *Deleted

## 2020-04-23 ENCOUNTER — Telehealth: Payer: Self-pay | Admitting: Cardiology

## 2020-04-23 DIAGNOSIS — I10 Essential (primary) hypertension: Secondary | ICD-10-CM | POA: Diagnosis not present

## 2020-04-23 NOTE — Telephone Encounter (Signed)
Called by [facility] regarding critical lab result, K of 5.8. Immediately contacted pt, who had no symptomatic complaints, however was angry that I called him so late regarding lab results that were drawn earlier in the day. I advised pt to go to the ED for treatment due to risk of arrhythmia; pt respectfully declines and says he will contact his primary cardiologist in the AM. I instructed him to stop his K supplement, and hold aldactone and losartan until told to restart these medications by his primary cardiologist.

## 2020-04-24 ENCOUNTER — Telehealth: Payer: Self-pay | Admitting: Interventional Cardiology

## 2020-04-24 DIAGNOSIS — E875 Hyperkalemia: Secondary | ICD-10-CM

## 2020-04-24 LAB — BASIC METABOLIC PANEL
BUN/Creatinine Ratio: 31 — ABNORMAL HIGH (ref 10–24)
BUN: 32 mg/dL — ABNORMAL HIGH (ref 8–27)
CO2: 24 mmol/L (ref 20–29)
Calcium: 9.9 mg/dL (ref 8.6–10.2)
Chloride: 102 mmol/L (ref 96–106)
Creatinine, Ser: 1.03 mg/dL (ref 0.76–1.27)
GFR calc Af Amer: 86 mL/min/{1.73_m2} (ref >59)
GFR calc non Af Amer: 74 mL/min/{1.73_m2} (ref >59)
Glucose: 112 mg/dL — ABNORMAL HIGH (ref 65–99)
Potassium: 5.8 mmol/L (ref 3.5–5.2)
Sodium: 139 mmol/L (ref 134–144)

## 2020-04-24 NOTE — Telephone Encounter (Signed)
See previous phone note.  

## 2020-04-24 NOTE — Telephone Encounter (Signed)
Pt called back in regards to K+ of 5.8.  Pt has not taken [facility] today as he spoke with Dr. Daphine Deutscher last night and was told to stop it.  Pt agreeable to come next Wednesday to have lab drawn again.  Pt appreciative for call.

## 2020-04-24 NOTE — Telephone Encounter (Signed)
Patient returning call for lab results. 

## 2020-04-30 ENCOUNTER — Other Ambulatory Visit: Payer: Medicare PPO | Admitting: *Deleted

## 2020-04-30 ENCOUNTER — Other Ambulatory Visit: Payer: Self-pay

## 2020-04-30 DIAGNOSIS — E875 Hyperkalemia: Secondary | ICD-10-CM | POA: Diagnosis not present

## 2020-04-30 LAB — BASIC METABOLIC PANEL
BUN/Creatinine Ratio: 18 (ref 10–24)
BUN: 19 mg/dL (ref 8–27)
CO2: 29 mmol/L (ref 20–29)
Calcium: 9.5 mg/dL (ref 8.6–10.2)
Chloride: 99 mmol/L (ref 96–106)
Creatinine, Ser: 1.03 mg/dL (ref 0.76–1.27)
GFR calc Af Amer: 86 mL/min/{1.73_m2} (ref >59)
GFR calc non Af Amer: 74 mL/min/{1.73_m2} (ref >59)
Glucose: 118 mg/dL — ABNORMAL HIGH (ref 65–99)
Potassium: 4.4 mmol/L (ref 3.5–5.2)
Sodium: 139 mmol/L (ref 134–144)

## 2020-05-05 ENCOUNTER — Other Ambulatory Visit: Payer: Self-pay | Admitting: *Deleted

## 2020-05-05 DIAGNOSIS — I2510 Atherosclerotic heart disease of native coronary artery without angina pectoris: Secondary | ICD-10-CM

## 2020-05-05 DIAGNOSIS — I480 Paroxysmal atrial fibrillation: Secondary | ICD-10-CM

## 2020-05-06 ENCOUNTER — Telehealth: Payer: Self-pay | Admitting: *Deleted

## 2020-05-06 NOTE — Telephone Encounter (Signed)
   [facility] Pre-operative Risk Assessment    HEARTCARE STAFF: - Please ensure there is not already an duplicate clearance open for this procedure. - Under Visit Info/Reason for Call, type in Other and utilize the format Clearance MM/DD/YY or Clearance TBD. Do not use dashes or single digits. - If request is for dental extraction, please clarify the # of teeth to be extracted.  Request for surgical clearance:  1. What type of surgery is being performed? SCREENING COLONOSCOPY   2. When is this surgery scheduled? 06/18/20   3. What type of clearance is required (medical clearance vs. Pharmacy clearance to hold med vs. Both)? BOTH  4. Are there any medications that need to be held prior to surgery and how long? XARELTO x 1-2 DAYS PRIOR TO PROCEDURE   5. Practice name and name of physician performing surgery? [facility]; DR. Gwyndolyn Saxon OUTLAW   6. What is the office phone number? [phone]   7.   What is the office fax number? [phone]  8.   Anesthesia type (None, local, MAC, general) ? NOT LISTED   Julaine Hua 05/06/2020, 5:11 PM  _________________________________________________________________   (provider comments below)

## 2020-05-07 NOTE — Telephone Encounter (Signed)
Patient with diagnosis of afib on Xarelto for anticoagulation.    Procedure: colonoscopy Date of procedure: 06/18/20  CHA2DS2-VASc Score = 4  This indicates a 4.8% annual risk of stroke. The patient's score is based upon: CHF History: 0 HTN History: 1 Diabetes History: 1 Stroke History: 0 Vascular Disease History: 1 Age Score: 1 Gender Score: 0  CrCl 13mL/min Platelet count 244K  Per office protocol, patient can hold Xarelto for 1-2 days prior to procedure as requested.

## 2020-05-07 NOTE — Telephone Encounter (Signed)
   Primary Cardiologist: Lesleigh Noe, MD  Chart reviewed as part of pre-operative protocol coverage. Patient was contacted 05/07/2020 in reference to pre-operative risk assessment for pending surgery as outlined below.  CALUM CORMIER was last seen on 02/13/20 by Rudi Coco NP.  Since that day, DAYRON ODLAND has done well. He can complete more than 4.0 METS without angina.   Per our clinical pharmacist: Patient with diagnosis of afib on Xarelto for anticoagulation.    Procedure: colonoscopy Date of procedure: 06/18/20  CHA2DS2-VASc Score = 4  This indicates a 4.8% annual risk of stroke. The patient's score is based upon: CHF History: 0 HTN History: 1 Diabetes History: 1 Stroke History: 0 Vascular Disease History: 1 Age Score: 1 Gender Score: 0  CrCl 25mL/min Platelet count 244K  Per office protocol, patient can hold Xarelto for 1-2 days prior to procedure as requested.  Therefore, based on ACC/AHA guidelines, the patient would be at acceptable risk for the planned procedure without further cardiovascular testing.   The patient was advised that if he develops new symptoms prior to surgery to contact our office to arrange for a follow-up visit, and he verbalized understanding.  I will route this recommendation to the requesting party via Epic fax function and remove from pre-op pool. Please call with questions.  Roe Rutherford Orvil Faraone, PA 05/07/2020, 11:53 AM

## 2020-05-14 ENCOUNTER — Ambulatory Visit: Payer: Medicare PPO | Admitting: Cardiology

## 2020-05-14 ENCOUNTER — Encounter: Payer: Self-pay | Admitting: Cardiology

## 2020-05-14 ENCOUNTER — Other Ambulatory Visit: Payer: Self-pay

## 2020-05-14 ENCOUNTER — Other Ambulatory Visit: Payer: Medicare PPO | Admitting: *Deleted

## 2020-05-14 VITALS — BP 128/78 | HR 78 | Ht 69.0 in | Wt 173.6 lb

## 2020-05-14 DIAGNOSIS — I2510 Atherosclerotic heart disease of native coronary artery without angina pectoris: Secondary | ICD-10-CM | POA: Diagnosis not present

## 2020-05-14 DIAGNOSIS — I480 Paroxysmal atrial fibrillation: Secondary | ICD-10-CM | POA: Diagnosis not present

## 2020-05-14 DIAGNOSIS — I10 Essential (primary) hypertension: Secondary | ICD-10-CM

## 2020-05-14 DIAGNOSIS — G4733 Obstructive sleep apnea (adult) (pediatric): Secondary | ICD-10-CM | POA: Diagnosis not present

## 2020-05-14 LAB — BASIC METABOLIC PANEL
BUN/Creatinine Ratio: 23 (ref 10–24)
BUN: 21 mg/dL (ref 8–27)
CO2: 27 mmol/L (ref 20–29)
Calcium: 10 mg/dL (ref 8.6–10.2)
Chloride: 101 mmol/L (ref 96–106)
Creatinine, Ser: 0.92 mg/dL (ref 0.76–1.27)
GFR calc Af Amer: 98 mL/min/{1.73_m2} (ref >59)
GFR calc non Af Amer: 85 mL/min/{1.73_m2} (ref >59)
Glucose: 138 mg/dL — ABNORMAL HIGH (ref 65–99)
Potassium: 4.7 mmol/L (ref 3.5–5.2)
Sodium: 144 mmol/L (ref 134–144)

## 2020-05-14 NOTE — Progress Notes (Signed)
Cardiology Office Note:    Date:  05/14/2020   ID:  Anthony Cole, DOB [birth year], MRN [id number]  PCP:  Daisy Floro, MD  Cardiologist:  Verdis Prime, MD   Referring MD: Daisy Floro, MD   Chief Complaint  Patient presents with  . Sleep Apnea  . Hypertension    History of Present Illness:    Anthony Cole is a 69 y.o. male with a hx of moderate OSA with an AHI of 19 events per hour. He is onCPAPat8cm H2O.  He is doing well with his CPAP device and thinks that he has gotten used to it.  He tolerates the mask and feels the pressure is adequate.  Since going on CPAP he feels rested in the am for the most part if he has not had to get up to use the restroom a lot.   He has no significant daytime sleepiness if he sleeps well the night before.  He denies any significant mouth or nasal dryness or nasal congestion.  He does not think that he snores.    Past Medical History:  Diagnosis Date  . Arthritis   . Atrial fibrillation (HCC)   . Diabetes mellitus without complication (HCC)    type 2  . Hyperlipidemia   . Hypertension   . Hypothyroidism   . Obesity (BMI 30-39.9) 01/25/2016  . OSA on CPAP 07/16/2014   Moderate with AHI 19/hr on CPAP at 8cm H2O  . Pre-diabetes   . Visit for monitoring Tikosyn therapy 10/25/2017    Past Surgical History:  Procedure Laterality Date  . ATRIAL FIBRILLATION ABLATION N/A 08/25/2017   Procedure: ATRIAL FIBRILLATION ABLATION;  Surgeon: Hillis Range, MD;  [facility];  Service: Cardiovascular;  Laterality: N/A;  . CARDIOVERSION  11/04/2011   Procedure: CARDIOVERSION;  Surgeon: Lesleigh Noe, MD;  Location: [facility] OR;  Service: Cardiovascular;  Laterality: N/A;  . CARDIOVERSION N/A 07/02/2013   Procedure: CARDIOVERSION;  Surgeon: Lesleigh Noe, MD;  Location: [facility] ENDOSCOPY;  Service: Cardiovascular;  Laterality: N/A;  10:07 elective cardioversion Lido 40mg , Propofol 60mg ,IV...200 joules, synched, pt presently in A  flutter,...cardioverted to NSR...  . CARDIOVERSION N/A 09/19/2013   Procedure: CARDIOVERSION;  Surgeon: , MD;  Location: [facility] ENDOSCOPY;  Service: Cardiovascular;  Laterality: N/A;  . CARDIOVERSION N/A 08/13/2014   Procedure: CARDIOVERSION;  Surgeon: [facility], MD;  Location: [facility] ENDOSCOPY;  Service: Cardiovascular;  Laterality: N/A;  . CARDIOVERSION N/A 05/23/2017   Procedure: CARDIOVERSION;  Surgeon: [facility], MD;  Location: [facility] ENDOSCOPY;  Service: Cardiovascular;  Laterality: N/A;  . CARDIOVERSION N/A 09/09/2017   Procedure: CARDIOVERSION;  Surgeon: [facility], MD;  Location: [facility] ENDOSCOPY;  Service: Cardiovascular;  Laterality: N/A;  . CARDIOVERSION N/A 09/30/2017   Procedure: CARDIOVERSION;  Surgeon: [facility], MD;  Location: [facility] ENDOSCOPY;  Service: Cardiovascular;  Laterality: N/A;  . EYE SURGERY    . FINGER FRACTURE SURGERY Left "when I was a kid"  . FRACTURE SURGERY    . KNEE ARTHROSCOPY W/ DEBRIDEMENT Bilateral   . RETINAL LASER PROCEDURE Bilateral    "tears on both; not detachments"  . SHOULDER ARTHROSCOPY W/ ROTATOR CUFF REPAIR Bilateral   . STAPEDES SURGERY Right   . TONSILLECTOMY    . TYMPANOPLASTY Bilateral    "skin grafts to ear drums"    Current Medications: Current Meds  Medication Sig  . acyclovir (ZOVIRAX) 200 MG capsule Take 200 mg by mouth 2 (  two) times daily as needed (first sign of fever blister).   . Cholecalciferol (VITAMIN D3 PO) Take 5,000 Units by mouth daily.   [name] diltiazem (CARDIZEM) 30 MG tablet Take 1 tablet every 4 hours AS NEEDED for AFIB heart rate >100 as long as top number of blood pressure >100.  [name] dofetilide (TIKOSYN) 500 MCG capsule TAKE (1) CAPSULE TWICE DAILY.  [name] doxycycline (VIBRA-TABS) 100 MG tablet Take 100 mg by mouth in the morning and at bedtime.   [name] levothyroxine (SYNTHROID, LEVOTHROID) 88 MCG tablet Take 88 mcg by mouth daily before breakfast.   . losartan (COZAAR) 100 MG tablet Take 1 tablet (100  mg total) by mouth daily.  . metoprolol succinate (TOPROL-XL) 25 MG 24 hr tablet TAKE 1 TABLET ONCE DAILY.  . Multiple Vitamins-Minerals (PRESERVISION AREDS 2+MULTI VIT PO) Take 1 capsule by mouth 2 (two) times daily.  . rivaroxaban (XARELTO) 20 MG TABS tablet TAKE 1 TABLET ONCE DAILY IN THE EVENING.  . rosuvastatin (CRESTOR) 20 MG tablet TAKE 1 TABLET ONCE DAILY.  [name] spironolactone (ALDACTONE) 25 MG tablet Take 0.5 tablets (12.5 mg total) by mouth daily.  [name] tretinoin (RETIN-A) 0.025 % cream Apply 1 application topically at bedtime.   Current Facility-Administered Medications for the 05/14/20 encounter (Office Visit) with Quintella Reichert, MD  Medication  . 0.9 %  sodium chloride infusion  . sodium chloride flush (NS) 0.9 % injection 3 mL  . sodium chloride flush (NS) 0.9 % injection 3 mL     Allergies:   Gabapentin and Other   Social History   Socioeconomic History  . Marital status: Single    Spouse name: Not on file  . Number of children: Not on file  . Years of education: Not on file  . Highest education level: Not on file  Occupational History  . Not on file  Tobacco Use  . Smoking status: Never Smoker  . Smokeless tobacco: Never Used  Vaping Use  . Vaping Use: Never used  Substance and Sexual Activity  . Alcohol use: No  . Drug use: No  . Sexual activity: Not Currently  Other Topics Concern  . Not on file  Social History Narrative  . Not on file   Social Determinants of Health   Financial Resource Strain:   . Difficulty of Paying Living Expenses: Not on file  Food Insecurity:   . Worried About Programme researcher, broadcasting/film/video in the Last Year: Not on file  . Ran Out of Food in the Last Year: Not on file  Transportation Needs:   . Lack of Transportation (Medical): Not on file  . Lack of Transportation (Non-Medical): Not on file  Physical Activity:   . Days of Exercise per Week: Not on file  . Minutes of Exercise per Session: Not on file  Stress:   . Feeling of Stress : Not  on file  Social Connections:   . Frequency of Communication with Friends and Family: Not on file  . Frequency of Social Gatherings with Friends and Family: Not on file  . Attends Religious Services: Not on file  . Active Member of Clubs or Organizations: Not on file  . Attends Banker Meetings: Not on file  . Marital Status: Not on file     Family History: The patient's family history includes Hypertension in his mother; Stroke in his maternal grandmother.  ROS:   Please see the history of present illness.    ROS  All other systems reviewed  and negative.   EKGs/Labs/Other Studies Reviewed:    The following studies were reviewed today: PAP compliance download  EKG:  EKG is not ordered today.    Recent Labs: 02/13/2020: Hemoglobin 14.9; Magnesium 2.1; Platelets 244 04/30/2020: BUN 19; Creatinine, Ser 1.03; Potassium 4.4; Sodium 139   Recent Lipid Panel    Component Value Date/Time   CHOL 131 08/10/2018 0926   TRIG 91 08/10/2018 0926   HDL 39 (L) 08/10/2018 0926   CHOLHDL 3.4 08/10/2018 0926   LDLCALC 74 08/10/2018 0926    Physical Exam:    VS:  BP 128/78   Pulse 78   Ht 5\' 9"  (1.753 m)   Wt 173 lb 9.6 oz (78.7 kg)   SpO2 98%   BMI 25.64 kg/m     Wt Readings from Last 3 Encounters:  05/14/20 173 lb 9.6 oz (78.7 kg)  02/13/20 168 lb (76.2 kg)  11/26/19 174 lb 1.9 oz (79 kg)     GEN: Well nourished, well developed in no acute distress HEENT: Normal NECK: No JVD; No carotid bruits LYMPHATICS: No lymphadenopathy CARDIAC:irergularly irregular, no murmurs, rubs, gallops RESPIRATORY:  Clear to auscultation without rales, wheezing or rhonchi  ABDOMEN: Soft, non-tender, non-distended MUSCULOSKELETAL:  No edema; No deformity  SKIN: Warm and dry NEUROLOGIC:  Alert and oriented x 3 PSYCHIATRIC:  Normal affect    ASSESSMENT:    1. OSA (obstructive sleep apnea)   2. Essential hypertension    PLAN:    In order of problems listed above:  1.  OSA -  The patient is tolerating PAP therapy well without any problems. The PAP download was reviewed today and showed an AHI of 6/hr on 9 cm H2O with 100% compliance in using more than 4 hours nightly.  The patient has been using and benefiting from PAP use and will continue to benefit from therapy.  -increase CPAP to 10cm H2O and get a download in 4 weeks  2.  HTN -BP controlled on exam -continue Losartan 100mg  daily, spiro 25mg  daily and Toprol XL 25mg  daily -SCr 1.03 in Nov 2021  Medication Adjustments/Labs and Tests Ordered: Current medicines are reviewed at length with the patient today.  Concerns regarding medicines are outlined above.  No orders of the defined types were placed in this encounter.  No orders of the defined types were placed in this encounter.   Signed, , MD  05/14/2020 9:57 AM    [facility]

## 2020-05-14 NOTE — Patient Instructions (Signed)

## 2020-05-15 ENCOUNTER — Telehealth: Payer: Self-pay | Admitting: *Deleted

## 2020-05-15 DIAGNOSIS — G4733 Obstructive sleep apnea (adult) (pediatric): Secondary | ICD-10-CM

## 2020-05-15 NOTE — Telephone Encounter (Signed)
Order placed to [employer] via community message 

## 2020-05-15 NOTE — Telephone Encounter (Signed)
-----   Message from Quintella Reichert, MD sent at 05/14/2020  9:59 AM EST ----- Increase CPAP to 10cm H2O and get a download in 4 weeks

## 2020-05-18 NOTE — Progress Notes (Signed)
Cardiology Office Note:    Date:  05/19/2020   ID:  Anthony Cole, DOB 18-Sep-1950, MRN [id number]  PCP:  Daisy Floro, MD  Cardiologist:  Lesleigh Noe, MD   Referring MD: Daisy Floro, MD   Chief Complaint  Patient presents with  . Hypertension  . Hyperlipidemia  . Coronary Artery Disease  . Atrial Fibrillation    History of Present Illness:    Anthony Cole is a 69 y.o. male with a hx of obstructive sleep apneaon CPAP, hypertension, hyperlipidemia,CAD with coronary calcium score 250, diabetes mellitusII,paroxysmal atrial fibrillations/p ablation 08/2017& recurrent AFIB --> Tykosyn10/07/2017.Repeat ablation discussed but not accepted.    Anthony Cole is doing well.  Earlier this year he had 1 blood test suggesting hyperkalemia that was possibly related to exogenous potassium intake.  Recent levels have been normal.  He denies angina and he has not had palpitations compatible with atrial fib.  He is getting greater than 150 minutes of moderate activity per week.  He has occasional fleeting chest discomfort.  He is concerned about holding Xarelto for colonoscopy.  Past Medical History:  Diagnosis Date  . Arthritis   . Atrial fibrillation (HCC)   . Diabetes mellitus without complication (HCC)    type 2  . Hyperlipidemia   . Hypertension   . Hypothyroidism   . Obesity (BMI 30-39.9) 01/25/2016  . OSA on CPAP 07/16/2014   Moderate with AHI 19/hr on CPAP at 8cm H2O  . Pre-diabetes   . Visit for monitoring Tikosyn therapy 10/25/2017    Past Surgical History:  Procedure Laterality Date  . ATRIAL FIBRILLATION ABLATION N/A 08/25/2017   Procedure: ATRIAL FIBRILLATION ABLATION;  Surgeon: Hillis Range, MD;  Location: [facility];  Service: Cardiovascular;  Laterality: N/A;  . CARDIOVERSION  11/04/2011   Procedure: CARDIOVERSION;  Surgeon: Lesleigh Noe, MD;  Location: [facility] OR;  Service: Cardiovascular;  Laterality: N/A;  . CARDIOVERSION N/A  07/02/2013   Procedure: CARDIOVERSION;  Surgeon: Lesleigh Noe, MD;  Location: [facility] ENDOSCOPY;  Service: Cardiovascular;  Laterality: N/A;  10:07 elective cardioversion Lido 40mg , Propofol 60mg ,IV...200 joules, synched, pt presently in A flutter,...cardioverted to NSR...  . CARDIOVERSION N/A 09/19/2013   Procedure: CARDIOVERSION;  Surgeon: , MD;  Location: [facility] ENDOSCOPY;  Service: Cardiovascular;  Laterality: N/A;  . CARDIOVERSION N/A 08/13/2014   Procedure: CARDIOVERSION;  Surgeon: [facility], MD;  Location: [facility] ENDOSCOPY;  Service: Cardiovascular;  Laterality: N/A;  . CARDIOVERSION N/A 05/23/2017   Procedure: CARDIOVERSION;  Surgeon: [facility], MD;  Location: [facility] ENDOSCOPY;  Service: Cardiovascular;  Laterality: N/A;  . CARDIOVERSION N/A 09/09/2017   Procedure: CARDIOVERSION;  Surgeon: [facility], MD;  Location: [facility] ENDOSCOPY;  Service: Cardiovascular;  Laterality: N/A;  . CARDIOVERSION N/A 09/30/2017   Procedure: CARDIOVERSION;  Surgeon: [facility], MD;  Location: [facility] ENDOSCOPY;  Service: Cardiovascular;  Laterality: N/A;  . EYE SURGERY    . FINGER FRACTURE SURGERY Left "when I was a kid"  . FRACTURE SURGERY    . KNEE ARTHROSCOPY W/ DEBRIDEMENT Bilateral   . RETINAL LASER PROCEDURE Bilateral    "tears on both; not detachments"  . SHOULDER ARTHROSCOPY W/ ROTATOR CUFF REPAIR Bilateral   . STAPEDES SURGERY Right   . TONSILLECTOMY    . TYMPANOPLASTY Bilateral    "skin grafts to ear drums"    Current Medications: Current Meds  Medication Sig  . acyclovir (ZOVIRAX) 200 MG capsule Take 200 mg  by mouth 2 (two) times daily as needed (first sign of fever blister).   . Cholecalciferol (VITAMIN D3 PO) Take 5,000 Units by mouth daily.   [name] diltiazem (CARDIZEM) 30 MG tablet Take 1 tablet every 4 hours AS NEEDED for AFIB heart rate >100 as long as top number of blood pressure >100.  [name] dofetilide (TIKOSYN) 500 MCG capsule TAKE (1) CAPSULE TWICE DAILY.  [name]  doxycycline (VIBRA-TABS) 100 MG tablet Take 100 mg by mouth in the morning and at bedtime.  [name] levothyroxine (SYNTHROID, LEVOTHROID) 88 MCG tablet Take 88 mcg by mouth daily before breakfast.   . losartan (COZAAR) 100 MG tablet Take 1 tablet (100 mg total) by mouth daily.  . metoprolol succinate (TOPROL-XL) 25 MG 24 hr tablet TAKE 1 TABLET ONCE DAILY.  . Multiple Vitamins-Minerals (PRESERVISION AREDS 2+MULTI VIT PO) Take 1 capsule by mouth 2 (two) times daily.  . rivaroxaban (XARELTO) 20 MG TABS tablet TAKE 1 TABLET ONCE DAILY IN THE EVENING.  . rosuvastatin (CRESTOR) 20 MG tablet TAKE 1 TABLET ONCE DAILY.  [name] spironolactone (ALDACTONE) 25 MG tablet Take 0.5 tablets (12.5 mg total) by mouth daily.  [name] tretinoin (RETIN-A) 0.025 % cream Apply 1 application topically at bedtime.   Current Facility-Administered Medications for the 05/19/20 encounter (Office Visit) with [name], MD  Medication  . 0.9 %  sodium chloride infusion  . sodium chloride flush (NS) 0.9 % injection 3 mL  . sodium chloride flush (NS) 0.9 % injection 3 mL     Allergies:   Gabapentin and Other   Social History   Socioeconomic History  . Marital status: Single    Spouse name: Not on file  . Number of children: Not on file  . Years of education: Not on file  . Highest education level: Not on file  Occupational History  . Not on file  Tobacco Use  . Smoking status: Never Smoker  . Smokeless tobacco: Never Used  Vaping Use  . Vaping Use: Never used  Substance and Sexual Activity  . Alcohol use: No  . Drug use: No  . Sexual activity: Not Currently  Other Topics Concern  . Not on file  Social History Narrative  . Not on file   Social Determinants of Health   Financial Resource Strain: Not on file  Food Insecurity: Not on file  Transportation Needs: Not on file  Physical Activity: Not on file  Stress: Not on file  Social Connections: Not on file     Family History: The patient's family history  includes Hypertension in his mother; Stroke in his maternal grandmother.  ROS:   Please see the history of present illness.    Anxiety about holding Xarelto.  Wants to know if he really needs a colonoscopy.  Otherwise no significant complaints or concerns.  All other systems reviewed and are negative.  EKGs/Labs/Other Studies Reviewed:    The following studies were reviewed today: 2D Doppler echocardiogram September 2021 IMPRESSIONS    1. Left ventricular ejection fraction, by estimation, is 60 to 65%. The  left ventricle has normal function. The left ventricle has no regional  wall motion abnormalities. There is severe asymmetric left ventricular  hypertrophy of the posterior-lateral  segment, and moderate ventricular septal hypertrophy. Left ventricular  diastolic parameters are indeterminate.  2. Right ventricular systolic function is normal. The right ventricular  size is normal. Tricuspid regurgitation signal is inadequate for assessing  PA pressure.  3. Left atrial size was severely dilated.  4. The mitral valve is grossly normal. Trivial mitral valve  regurgitation. No evidence of mitral stenosis.  5. The aortic valve is tricuspid. Aortic valve regurgitation is not  visualized. No aortic stenosis is present.  6. Aortic dilatation noted. There is mild dilatation of the aortic root,  measuring 41 mm.  7. The inferior vena cava is normal in size with greater than 50%  respiratory variability, suggesting right atrial pressure of 3 mmHg.    EKG:  EKG Performed February 13, 2020, demonstrated atrial flutter with variable response and controlled rate.  Nonspecific ST abnormality.  A new tracing is not performed today.  Recent Labs: 02/13/2020: Hemoglobin 14.9; Magnesium 2.1; Platelets 244 05/14/2020: BUN 21; Creatinine, Ser 0.92; Potassium 4.7; Sodium 144  Recent Lipid Panel    Component Value Date/Time   CHOL 131 08/10/2018 0926   TRIG 91 08/10/2018 0926   HDL 39 (L)  08/10/2018 0926   CHOLHDL 3.4 08/10/2018 0926   LDLCALC 74 08/10/2018 0926    Physical Exam:    VS:  BP 130/74   Pulse 74   Ht 5' 9.5" (1.765 m)   Wt 175 lb (79.4 kg)   SpO2 99%   BMI 25.47 kg/m     Wt Readings from Last 3 Encounters:  05/19/20 175 lb (79.4 kg)  05/14/20 173 lb 9.6 oz (78.7 kg)  02/13/20 168 lb (76.2 kg)     GEN: Compatible with age. No acute distress HEENT: Normal NECK: No JVD. LYMPHATICS: No lymphadenopathy CARDIAC:  RRR without murmur, gallop, or edema. VASCULAR:  Normal Pulses. No bruits. RESPIRATORY:  Clear to auscultation without rales, wheezing or rhonchi  ABDOMEN: Soft, non-tender, non-distended, No pulsatile mass, MUSCULOSKELETAL: No deformity  SKIN: Warm and dry NEUROLOGIC:  Alert and oriented x 3 PSYCHIATRIC:  Normal affect   ASSESSMENT:    1. Paroxysmal atrial fibrillation (HCC)   2. OSA (obstructive sleep apnea)   3. Essential hypertension   4. Long term current use of anticoagulant therapy   5. Visit for monitoring Tikosyn therapy   6. Hyperlipidemia with target LDL less than 70   7. Coronary artery calcification seen on CAT scan   8. Educated about COVID-19 virus infection    PLAN:    In order of problems listed above:  1. Stable with rhythm control post ablation and with concomitant Tikosyn therapy 500 mcg twice daily.  He is on Xarelto for elevated stroke risk.  No bleeding complications. 2. Compliant with CPAP.  Getting greater than 6 hours of sleep per night. 3. Blood pressure control is excellent on Toprol, Cozaar, diltiazem, and Aldactone. 4. Xarelto therapy without complications.  Continue because of elevated stroke risk. 5. Tikosyn therapy without complications. 6. Rosuvastatin 20 mg/day has yielded an LDL of 91.  This is suboptimal control given glucose intolerance and coronary calcification.  He is leery about further increasing the intensity of therapy.  We discussed plant-based diet. 7. Primary prevention as noted  below discussed in detail. 8. Vaccinated, booster, and practicing medication.  Overall education and awareness concerning primary risk prevention was discussed in detail: LDL less than 70, hemoglobin A1c less than 7, blood pressure target less than 130/80 mmHg, >150 minutes of moderate aerobic activity per week, avoidance of smoking, weight control (via diet and exercise), and continued surveillance/management of/for obstructive sleep apnea.    Medication Adjustments/Labs and Tests Ordered: Current medicines are reviewed at length with the patient today.  Concerns regarding medicines are outlined above.  No orders of the  defined types were placed in this encounter.  No orders of the defined types were placed in this encounter.   Patient Instructions  Medication Instructions:  Your physician recommends that you continue on your current medications as directed. Please refer to the Current Medication list given to you today.  *If you need a refill on your cardiac medications before your next appointment, please call your pharmacy*   Lab Work: None If you have labs (blood work) drawn today and your tests are completely normal, you will receive your results only by: [name]. MyChart Message (if you have MyChart) OR . A paper copy in the mail If you have any lab test that is abnormal or we need to change your treatment, we will call you to review the results.   Testing/Procedures: None   Follow-Up: At [facility], you and your health needs are our priority.  As part of our continuing mission to provide you with exceptional heart care, we have created designated Provider Care Teams.  These Care Teams include your primary Cardiologist (physician) and Advanced Practice Providers (APPs -  Physician Assistants and Nurse Practitioners) who all work together to provide you with the care you need, when you need it.  We recommend signing up for the patient portal called "MyChart".  Sign up  information is provided on this After Visit Summary.  MyChart is used to connect with patients for Virtual Visits (Telemedicine).  Patients are able to view lab/test results, encounter notes, upcoming appointments, etc.  Non-urgent messages can be sent to your provider as well.   To learn more about what you can do with MyChart, go to [email or web address].    Your next appointment:   6 month(s)  The format for your next appointment:   In Person  Provider:   You may see Lesleigh NoeHenry W Navaeh Kehres III, MD or one of the following Advanced Practice Providers on your designated Care Team:    Norma FredricksonLori Gerhardt, NP  Nada BoozerLaura Ingold, NP  Georgie ChardJill McDaniel, NP    Other Instructions  Your provider recommends that you maintain greater than 150 minutes per week of moderate aerobic activity. Your A1C needs to be less than 7. LDL (bad) cholesterol needs to be 70 or less. Blood pressure goal is 130/80 or less. Make sure you are getting at least 6 hours of sleep at night and wearing your CPAP machine.       Signed, Lesleigh NoeHenry W Laneya Gasaway III, MD  05/19/2020 9:11 AM    [facility]

## 2020-05-19 ENCOUNTER — Encounter: Payer: Self-pay | Admitting: Interventional Cardiology

## 2020-05-19 ENCOUNTER — Ambulatory Visit: Payer: Medicare PPO | Admitting: Interventional Cardiology

## 2020-05-19 ENCOUNTER — Other Ambulatory Visit: Payer: Self-pay

## 2020-05-19 VITALS — BP 130/74 | HR 74 | Ht 69.5 in | Wt 175.0 lb

## 2020-05-19 DIAGNOSIS — I480 Paroxysmal atrial fibrillation: Secondary | ICD-10-CM | POA: Diagnosis not present

## 2020-05-19 DIAGNOSIS — Z7189 Other specified counseling: Secondary | ICD-10-CM | POA: Diagnosis not present

## 2020-05-19 DIAGNOSIS — E785 Hyperlipidemia, unspecified: Secondary | ICD-10-CM | POA: Diagnosis not present

## 2020-05-19 DIAGNOSIS — I2510 Atherosclerotic heart disease of native coronary artery without angina pectoris: Secondary | ICD-10-CM

## 2020-05-19 DIAGNOSIS — Z7901 Long term (current) use of anticoagulants: Secondary | ICD-10-CM | POA: Diagnosis not present

## 2020-05-19 DIAGNOSIS — Z5181 Encounter for therapeutic drug level monitoring: Secondary | ICD-10-CM

## 2020-05-19 DIAGNOSIS — G4733 Obstructive sleep apnea (adult) (pediatric): Secondary | ICD-10-CM | POA: Diagnosis not present

## 2020-05-19 DIAGNOSIS — Z79899 Other long term (current) drug therapy: Secondary | ICD-10-CM | POA: Diagnosis not present

## 2020-05-19 DIAGNOSIS — I10 Essential (primary) hypertension: Secondary | ICD-10-CM

## 2020-05-19 NOTE — Patient Instructions (Signed)
Medication Instructions:  Your physician recommends that you continue on your current medications as directed. Please refer to the Current Medication list given to you today.  *If you need a refill on your cardiac medications before your next appointment, please call your pharmacy*   Lab Work: None If you have labs (blood work) drawn today and your tests are completely normal, you will receive your results only by: [name] MyChart Message (if you have MyChart) OR . A paper copy in the mail If you have any lab test that is abnormal or we need to change your treatment, we will call you to review the results.   Testing/Procedures: None   Follow-Up: At [facility], you and your health needs are our priority.  As part of our continuing mission to provide you with exceptional heart care, we have created designated Provider Care Teams.  These Care Teams include your primary Cardiologist (physician) and Advanced Practice Providers (APPs -  Physician Assistants and Nurse Practitioners) who all work together to provide you with the care you need, when you need it.  We recommend signing up for the patient portal called "MyChart".  Sign up information is provided on this After Visit Summary.  MyChart is used to connect with patients for Virtual Visits (Telemedicine).  Patients are able to view lab/test results, encounter notes, upcoming appointments, etc.  Non-urgent messages can be sent to your provider as well.   To learn more about what you can do with MyChart, go to [email or web address].    Your next appointment:   6 month(s)  The format for your next appointment:   In Person  Provider:   You may see Lesleigh Noe, MD or one of the following Advanced Practice Providers on your designated Care Team:    Norma Fredrickson, NP  Nada Boozer, NP  Georgie Chard, NP    Other Instructions  Your provider recommends that you maintain greater than 150 minutes per week of moderate aerobic  activity. Your A1C needs to be less than 7. LDL (bad) cholesterol needs to be 70 or less. Blood pressure goal is 130/80 or less. Make sure you are getting at least 6 hours of sleep at night and wearing your CPAP machine.

## 2020-05-22 ENCOUNTER — Other Ambulatory Visit: Payer: Self-pay | Admitting: Interventional Cardiology

## 2020-05-22 ENCOUNTER — Other Ambulatory Visit (HOSPITAL_COMMUNITY): Payer: Self-pay | Admitting: Nurse Practitioner

## 2020-05-22 NOTE — Telephone Encounter (Signed)
This is a A-Fib clinic pt 

## 2020-05-23 ENCOUNTER — Other Ambulatory Visit: Payer: Self-pay | Admitting: *Deleted

## 2020-05-23 MED ORDER — METOPROLOL SUCCINATE ER 25 MG PO TB24
25.0000 mg | ORAL_TABLET | Freq: Every day | ORAL | 3 refills | Status: DC
Start: 2020-05-23 — End: 2020-10-14

## 2020-05-29 ENCOUNTER — Other Ambulatory Visit: Payer: Self-pay | Admitting: Interventional Cardiology

## 2020-06-20 ENCOUNTER — Telehealth: Payer: Self-pay | Admitting: *Deleted

## 2020-06-20 NOTE — Telephone Encounter (Signed)
-----   Message from Quintella Reichert, MD sent at 06/18/2020 11:34 AM EST ----- Good AHI and compliance.  Continue current PAP settings.

## 2020-06-20 NOTE — Telephone Encounter (Signed)
Patient is agreeable to AHI being in range at 5.5, being in compliance and continuing current settings

## 2020-07-27 DIAGNOSIS — F419 Anxiety disorder, unspecified: Secondary | ICD-10-CM | POA: Diagnosis not present

## 2020-07-27 DIAGNOSIS — R739 Hyperglycemia, unspecified: Secondary | ICD-10-CM | POA: Diagnosis not present

## 2020-07-27 DIAGNOSIS — R002 Palpitations: Secondary | ICD-10-CM | POA: Diagnosis not present

## 2020-07-28 DIAGNOSIS — F430 Acute stress reaction: Secondary | ICD-10-CM | POA: Diagnosis not present

## 2020-07-28 DIAGNOSIS — N50819 Testicular pain, unspecified: Secondary | ICD-10-CM | POA: Diagnosis not present

## 2020-07-29 DIAGNOSIS — R7303 Prediabetes: Secondary | ICD-10-CM | POA: Diagnosis not present

## 2020-07-29 DIAGNOSIS — E063 Autoimmune thyroiditis: Secondary | ICD-10-CM | POA: Diagnosis not present

## 2020-07-29 DIAGNOSIS — E039 Hypothyroidism, unspecified: Secondary | ICD-10-CM | POA: Diagnosis not present

## 2020-07-30 ENCOUNTER — Telehealth: Payer: Self-pay

## 2020-07-30 NOTE — Telephone Encounter (Addendum)
**Note De-identified [name] Obfuscation** -----  **Note De-Identified [name] Obfuscation** Message from [name], RN sent at 07/29/2020 12:07 PM EST ----- Regarding: Xarelto Tier Exception  This pt just dropped by the office wanting to see about getting his Xarelto Tier Exception renewed because it was only good through the end of 2021.

## 2020-07-30 NOTE — Telephone Encounter (Signed)
**Note De-Identified Tawny Raspberry Obfuscation** Xarelto tier exception started through covermymeds. Key: [id number]

## 2020-07-31 NOTE — Telephone Encounter (Signed)
**Note De-Identified Anthony Cole Obfuscation** Letter received from [facility] stating that they denied the pts Xarelto tier exception. Reason: Xarelto is already at the lowest tier possible for a name brand medication per Medicare Part D.  We have no notes stating that he was approved for a tier exception last year but we did note that a PA was done and approved on 07/13/19. was advised that it is too soon for his refill as it is not due to fill again until March.  I called [employer] and s/w Herbert Seta to see if the pt may have been referring to a prior authorization for his Xarelto and was advised that it is too soon for his refill as it is not due to fill again until March.  Will attempt a PA through covermymeds.

## 2020-07-31 NOTE — Telephone Encounter (Signed)
**Note De-Identified Jayci Ellefson Obfuscation** Xarelto PA started through covermymeds and following message was received:  Barnie Mort Key: [id number] Outcome: Available without authorization Drug: Xarelto 20MG  tablets Form: PA Form

## 2020-09-10 ENCOUNTER — Telehealth: Payer: Self-pay | Admitting: *Deleted

## 2020-09-10 ENCOUNTER — Telehealth: Payer: Self-pay | Admitting: Interventional Cardiology

## 2020-09-10 NOTE — Telephone Encounter (Signed)
Pharmacy, can you please comment on how long Xarelto can be held for upcoming procedure?  Thank you! 

## 2020-09-10 NOTE — Telephone Encounter (Signed)
Pt wanted to update some information on his family history.  Pt's brother passed away on 08-13-20 at 69 years old from AF and CVA.  Was on Xarelto but hadn't taken it for a year.  Pt went to [facility] in clinic on 2/20 and was noted to be in AF then.  He wanted to make [place] aware of that information since he is in need of a clearance for his colonoscopy coming up soon.  Advised I will add information to chart.  Pt appreciative for call.

## 2020-09-10 NOTE — Telephone Encounter (Signed)
   [place] Pre-operative Risk Assessment    Patient Name: Anthony Cole  DOB: 1951/03/16  MRN: [id number]   HEARTCARE STAFF: - Please ensure there is not already an duplicate clearance open for this procedure. - Under Visit Info/Reason for Call, type in Other and utilize the format Clearance MM/DD/YY or Clearance TBD. Do not use dashes or single digits. - If request is for dental extraction, please clarify the # of teeth to be extracted.  Request for surgical clearance:  1. What type of surgery is being performed? Colonoscopy   2. When is this surgery scheduled? 09/16/2020   3. What type of clearance is required (medical clearance vs. Pharmacy clearance to hold med vs. Both)? Both  4. Are there any medications that need to be held prior to surgery and how long?Xarelto 1-2 days prior   5. Practice name and name of physician performing surgery? [facility], Dr Arta Silence   6. What is the office phone number? [phone]   7.   What is the office fax number? [phone]  8.   Anesthesia type (None, local, MAC, general) ? Not listed   Anthony Cole 09/10/2020, 4:44 PM  _________________________________________________________________   (provider comments below)

## 2020-09-10 NOTE — Telephone Encounter (Signed)
Received call from pt stating that insurance said we could try to do an appeal for his tier exception for his Xarelto.  Advised pt I will send message to [facility], LPN to see if an appeal is something we can do.

## 2020-09-10 NOTE — Telephone Encounter (Signed)
    Pt requesting a callback from [place], I asked for the reason, he did not provide any info

## 2020-09-11 ENCOUNTER — Other Ambulatory Visit: Payer: Self-pay

## 2020-09-11 ENCOUNTER — Ambulatory Visit: Payer: Medicare PPO | Admitting: Internal Medicine

## 2020-09-11 ENCOUNTER — Encounter: Payer: Self-pay | Admitting: Internal Medicine

## 2020-09-11 VITALS — BP 140/100 | HR 75 | Ht 69.0 in | Wt 161.0 lb

## 2020-09-11 DIAGNOSIS — E785 Hyperlipidemia, unspecified: Secondary | ICD-10-CM

## 2020-09-11 DIAGNOSIS — I152 Hypertension secondary to endocrine disorders: Secondary | ICD-10-CM | POA: Diagnosis not present

## 2020-09-11 DIAGNOSIS — I480 Paroxysmal atrial fibrillation: Secondary | ICD-10-CM | POA: Diagnosis not present

## 2020-09-11 DIAGNOSIS — I700 Atherosclerosis of aorta: Secondary | ICD-10-CM | POA: Insufficient documentation

## 2020-09-11 DIAGNOSIS — G4733 Obstructive sleep apnea (adult) (pediatric): Secondary | ICD-10-CM

## 2020-09-11 DIAGNOSIS — I484 Atypical atrial flutter: Secondary | ICD-10-CM

## 2020-09-11 DIAGNOSIS — E1159 Type 2 diabetes mellitus with other circulatory complications: Secondary | ICD-10-CM | POA: Diagnosis not present

## 2020-09-11 DIAGNOSIS — E1169 Type 2 diabetes mellitus with other specified complication: Secondary | ICD-10-CM

## 2020-09-11 NOTE — Patient Instructions (Signed)
Medication Instructions:  Your physician recommends that you continue on your current medications as directed. Please refer to the Current Medication list given to you today.  *If you need a refill on your cardiac medications before your next appointment, please call your pharmacy*   Lab Work: NONE If you have labs (blood work) drawn today and your tests are completely normal, you will receive your results only by: [name] MyChart Message (if you have MyChart) OR . A paper copy in the mail If you have any lab test that is abnormal or we need to change your treatment, we will call you to review the results.   Testing/Procedures: Your physician has requested that you have an echocardiogram. Echocardiography is a painless test that uses sound waves to create images of your heart. It provides your doctor with information about the size and shape of your heart and how well your heart's chambers and valves are working. This procedure takes approximately one hour. There are no restrictions for this procedure.     Follow-Up: With Dr. Katrinka Blazing At [facility], you and your health needs are our priority.  As part of our continuing mission to provide you with exceptional heart care, we have created designated Provider Care Teams.  These Care Teams include your primary Cardiologist (physician) and Advanced Practice Providers (APPs -  Physician Assistants and Nurse Practitioners) who all work together to provide you with the care you need, when you need it.

## 2020-09-11 NOTE — Telephone Encounter (Signed)
Patient with diagnosis of afib on Xarelto for anticoagulation.    Procedure: colonoscopy Date of procedure: 09/16/20  CHA2DS2-VASc Score = 4  This indicates a 4.8% annual risk of stroke. The patient's score is based upon: CHF History: No HTN History: Yes Diabetes History: Yes Stroke History: No Vascular Disease History: Yes Age Score: 1 Gender Score: 0     CrCl 78 ml/min Platelet count 244  Per office protocol, patient can hold Xarelto for 1 day prior to procedure.

## 2020-09-11 NOTE — Telephone Encounter (Signed)
**Note De-Identified [name] Obfuscation** I advised the pt that because Xarelto is a name brand medication it is already at the lowest tier possible per Medicare part D. We did discuss generic Warfarin but Anthony Cole is not interested in taking due to required office visits for INR checks.  Anthony Cole states that Anthony Cole will pay for his Xarelto and thanked me for calling to discuss options.

## 2020-09-11 NOTE — Telephone Encounter (Addendum)
Sorry, actually he cannot. 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 He has the medicare plan option

## 2020-09-11 NOTE — Progress Notes (Signed)
Cardiology Office Note:    Date:  09/11/2020   ID:  Anthony Cole, DOB [birth year], MRN [id number]  PCP:  Daisy Floro, MD   [facility]  Cardiologist:  Lesleigh Noe, MD  Advanced Practice Provider:  No care team member to display Electrophysiologist:  Hillis Range, MD       CC: DOD eval for AF prior to colonoscopy  History of Present Illness:    Anthony Cole is a 70 y.o. male with a hx of PAF; s/p 2019 ablation and Tikosyn load (after return), HTN with DM, HLD with DM, Aortic Atherisclerosis and CAC seen by Dr. Katrinka Blazing 05/19/2020.  Patient has had some increase in his PAF and there were questions about [facility] management this AM; DOD called and asked to assess prior to Colonoscopy next week.   Patient notes that he is doing poorly.  Notes that he has had a 15 lbs weight loss.  Has also been dealing with his brothers death from CVA-> from AF.  Since day last visit  With Dr. Katrinka Blazing notes new rib pain, pelvic pain, testicular pain, and chest pain changes.   There are no interval hospital/ED visit.    Chest Pain occurs spontaneously and is independent of activity.  No SOB/DOE and no PND/Orthopnea.  Notes weight loss or leg swelling.  No palpitations or syncope.   Past Medical History:  Diagnosis Date  . Arthritis   . Atrial fibrillation (HCC)   . Diabetes mellitus without complication (HCC)    type 2  . Hyperlipidemia   . Hypertension   . Hypothyroidism   . Obesity (BMI 30-39.9) 01/25/2016  . OSA on CPAP 07/16/2014   Moderate with AHI 19/hr on CPAP at 8cm H2O  . Pre-diabetes   . Visit for monitoring Tikosyn therapy 10/25/2017    Past Surgical History:  Procedure Laterality Date  . ATRIAL FIBRILLATION ABLATION N/A 08/25/2017   Procedure: ATRIAL FIBRILLATION ABLATION;  Surgeon: Hillis Range, MD;  Location: [facility] INVASIVE CV LAB;  Service: Cardiovascular;  Laterality: N/A;  . CARDIOVERSION  11/04/2011   Procedure: CARDIOVERSION;  Surgeon: Lesleigh Noe, MD;  Location: [facility] OR;  Service: Cardiovascular;  Laterality: N/A;  . CARDIOVERSION N/A 07/02/2013   Procedure: CARDIOVERSION;  Surgeon: Lesleigh Noe, MD;  Location: [facility] ENDOSCOPY;  Service: Cardiovascular;  Laterality: N/A;  10:07 elective cardioversion Lido 40mg , Propofol 60mg ,IV...200 joules, synched, pt presently in A flutter,...cardioverted to NSR...  . CARDIOVERSION N/A 09/19/2013   Procedure: CARDIOVERSION;  Surgeon: , MD;  Location: [facility] ENDOSCOPY;  Service: Cardiovascular;  Laterality: N/A;  . CARDIOVERSION N/A 08/13/2014   Procedure: CARDIOVERSION;  Surgeon: [facility], MD;  Location: [facility] ENDOSCOPY;  Service: Cardiovascular;  Laterality: N/A;  . CARDIOVERSION N/A 05/23/2017   Procedure: CARDIOVERSION;  Surgeon: [facility], MD;  Location: [facility] ENDOSCOPY;  Service: Cardiovascular;  Laterality: N/A;  . CARDIOVERSION N/A 09/09/2017   Procedure: CARDIOVERSION;  Surgeon: [facility], MD;  Location: [facility] ENDOSCOPY;  Service: Cardiovascular;  Laterality: N/A;  . CARDIOVERSION N/A 09/30/2017   Procedure: CARDIOVERSION;  Surgeon: [facility], MD;  Location: [facility] ENDOSCOPY;  Service: Cardiovascular;  Laterality: N/A;  . EYE SURGERY    . FINGER FRACTURE SURGERY Left "when I was a kid"  . FRACTURE SURGERY    . KNEE ARTHROSCOPY W/ DEBRIDEMENT Bilateral   . RETINAL LASER PROCEDURE Bilateral    "tears on both; not detachments"  . SHOULDER ARTHROSCOPY W/  ROTATOR CUFF REPAIR Bilateral   . STAPEDES SURGERY Right   . TONSILLECTOMY    . TYMPANOPLASTY Bilateral    "skin grafts to ear drums"    Current Medications: Current Meds  Medication Sig  . acyclovir (ZOVIRAX) 200 MG capsule Take 200 mg by mouth 2 (two) times daily as needed (first sign of fever blister).   . Cholecalciferol (VITAMIN D3 PO) Take 5,000 Units by mouth daily.   [name]. diltiazem (CARDIZEM) 30 MG tablet Take 1 tablet every 4 hours AS NEEDED for AFIB heart rate >100 as long as top number of blood  pressure >100.  [name]. dofetilide (TIKOSYN) 500 MCG capsule TAKE (1) CAPSULE TWICE DAILY.  [name]. doxycycline (VIBRA-TABS) 100 MG tablet Take 100 mg by mouth in the morning and at bedtime.  [name]. levothyroxine (SYNTHROID, LEVOTHROID) 88 MCG tablet Take 88 mcg by mouth daily before breakfast.   . losartan (COZAAR) 100 MG tablet TAKE 1 TABLET ONCE DAILY.  . metoprolol succinate (TOPROL-XL) 25 MG 24 hr tablet Take 1 tablet (25 mg total) by mouth daily.  . Multiple Vitamins-Minerals (PRESERVISION AREDS 2+MULTI VIT PO) Take 1 capsule by mouth 2 (two) times daily.  . rosuvastatin (CRESTOR) 20 MG tablet TAKE 1 TABLET ONCE DAILY.  [name]. spironolactone (ALDACTONE) 25 MG tablet Take 0.5 tablets (12.5 mg total) by mouth daily.  [name]. tretinoin (RETIN-A) 0.025 % cream Apply 1 application topically at bedtime.  [name]. XARELTO 20 MG TABS tablet TAKE 1 TABLET ONCE DAILY IN THE EVENING.   Current Facility-Administered Medications for the 09/11/20 encounter (Office Visit) with Christell Constanthandrasekhar, Lequisha Cammack A, MD  Medication  . 0.9 %  sodium chloride infusion  . sodium chloride flush (NS) 0.9 % injection 3 mL  . sodium chloride flush (NS) 0.9 % injection 3 mL     Allergies:   Gabapentin, Other, Atorvastatin, and Tamsulosin hcl   Social History   Socioeconomic History  . Marital status: Single    Spouse name: Not on file  . Number of children: Not on file  . Years of education: Not on file  . Highest education level: Not on file  Occupational History  . Not on file  Tobacco Use  . Smoking status: Never Smoker  . Smokeless tobacco: Never Used  Vaping Use  . Vaping Use: Never used  Substance and Sexual Activity  . Alcohol use: No  . Drug use: No  . Sexual activity: Not Currently  Other Topics Concern  . Not on file  Social History Narrative  . Not on file   Social Determinants of Health   Financial Resource Strain: Not on file  Food Insecurity: Not on file  Transportation Needs: Not on file  Physical Activity: Not on file   Stress: Not on file  Social Connections: Not on file     Family History: The patient's family history includes Hypertension in his mother; Stroke in his maternal grandmother.  ROS:   Please see the history of present illness.     All other systems reviewed and are negative.  EKGs/Labs/Other Studies Reviewed:    The following studies were reviewed today:  EKG:  EKG is  ordered today.  The ekg ordered today demonstrates  09/11/20 SR 75 with RVR QTc 444   Transthoracic Echocardiogram: Date: 02/20/2020 Results: Moderate to severe concentric hypertrophy without SAM; AA 39 mm 1. Left ventricular ejection fraction, by estimation, is 60 to 65%. The  left ventricle has normal function. The left ventricle has no regional  wall motion abnormalities.  There is severe asymmetric left ventricular  hypertrophy of the posterior-lateral  segment, and moderate ventricular septal hypertrophy. Left ventricular  diastolic parameters are indeterminate.  2. Right ventricular systolic function is normal. The right ventricular  size is normal. Tricuspid regurgitation signal is inadequate for assessing  PA pressure.  3. Left atrial size was severely dilated.  4. The mitral valve is grossly normal. Trivial mitral valve  regurgitation. No evidence of mitral stenosis.  5. The aortic valve is tricuspid. Aortic valve regurgitation is not  visualized. No aortic stenosis is present.  6. Aortic dilatation noted. There is mild dilatation of the aortic root,  measuring 41 mm.  7. The inferior vena cava is normal in size with greater than 50%  respiratory variability, suggesting right atrial pressure of 3 mmHg.    Cardiac CT: Date: 09/11/2020 Results: Aorta WNL; unable to open 12/25/19 study IMPRESSION: 1. There is normal pulmonary vein drainage into the left atrium.  2. The left atrial appendage is large - mixed chicken wing / broccoli type with two major lobes. There is no thrombus in the  left atrial appendage.  3. The esophagus runs to the left from the left atrial midline and is in the proximity to the LLPV.  4. Normal coronary origin. left dominance. The study was performed without use of NTG and insufficient for plaque evaluation. Calcium score is 247 that represents 71 percentile for age/sex.  5. Mildly dilated pulmonary artery measuring 31 mm.    NM Stress Testing : Date: 05/14/2015 Results:  Nuclear stress EF: 59%.  ST segment depression was noted during stress in the II, III, aVF and V5 leads.  The perfusion study is normal. No T.I.D.  This is a low risk study. False positive stress treadmill (ST depression). Fatigue only.  If symptoms worsen or become more worrisome, further cardiac testing may be required.    Recent Labs: 02/13/2020: Hemoglobin 14.9; Magnesium 2.1; Platelets 244 05/14/2020: BUN 21; Creatinine, Ser 0.92; Potassium 4.7; Sodium 144  Recent Lipid Panel    Component Value Date/Time   CHOL 131 08/10/2018 0926   TRIG 91 08/10/2018 0926   HDL 39 (L) 08/10/2018 0926   CHOLHDL 3.4 08/10/2018 0926   LDLCALC 74 08/10/2018 0926    Risk Assessment/Calculations:    CHA2DS2-VASc Score = 4  This indicates a 4.8% annual risk of stroke. The patient's score is based upon: CHF History: No HTN History: Yes Diabetes History: Yes Stroke History: No Vascular Disease History: Yes Age Score: 1 Gender Score: 0      Physical Exam:    VS:  BP (!) 140/100 Comment: Left  Pulse 75   Ht 5\' 9"  (1.753 m)   Wt 161 lb (73 kg)   SpO2 98%   BMI 23.78 kg/m     Wt Readings from Last 3 Encounters:  09/11/20 161 lb (73 kg)  05/19/20 175 lb (79.4 kg)  05/14/20 173 lb 9.6 oz (78.7 kg)    GEN:  Well nourished, well developed in no acute distress HEENT: Normal NECK: No JVD; No carotid bruits LYMPHATICS: No lymphadenopathy CARDIAC: Irregularly irregular, no murmurs, rubs, gallops RESPIRATORY:  Clear to auscultation without rales, wheezing or  rhonchi  ABDOMEN: Soft, non-tender, non-distended MUSCULOSKELETAL:  No edema; No deformity  SKIN: Warm and dry NEUROLOGIC:  Alert and oriented x 3 PSYCHIATRIC:  Normal affect   ASSESSMENT:    1. Paroxysmal atrial fibrillation (HCC)   2. Hypertension associated with diabetes (HCC)   3. OSA (obstructive sleep apnea)  4. Aortic atherosclerosis (HCC)   5. Hyperlipidemia associated with type 2 diabetes mellitus (HCC)   6. Atypical atrial flutter (HCC)    PLAN:    In order of problems listed above:  Persistent Atrial Flutter HTN with DM HLD with DM OSA on CPAP Aortic Atherosclerosis - Risk factors include HTN, DM, CAC - CHADSVASC=3-4 (vascular calcium without angina). - continue CPAP - Continue anticoagulation with Xarelto until one day PTA procedure; restart when ok from proceduralist. (planned for next day) - Continue rate control with metoprolol (has PRN diltiazem and this can be used if RVR in the peri-procedural period) - Rhythm control options Continue Tikoysn, be cautious of peri-procedural QT prolonging medications - Considerations for DCCV: (no short term plans especially given plans for future procedure)- but will reach out to Primary MD about post echo EP follow up; it appears Tikoysn is not controlling him into SR - Consideration for ablation: (no short term plans especially given 2019 ablation and plans for future procedure) - continue current statin and BP regimen - will get echocardiogram to assess LVEF with persistent AFl  Time Spent Directly with Patient:   I have spent a total of 40 minutes with the patient reviewing notes, imaging, EKGs, labs and examining the patient as well as establishing an assessment and plan that was discussed personally with the patient.  > 50% of time was spent in direct patient care.    Medication Adjustments/Labs and Tests Ordered: Current medicines are reviewed at length with the patient today.  Concerns regarding medicines are  outlined above.  Orders Placed This Encounter  Procedures  . EKG 12-Lead  . ECHOCARDIOGRAM COMPLETE   No orders of the defined types were placed in this encounter.   Patient Instructions  Medication Instructions:  Your physician recommends that you continue on your current medications as directed. Please refer to the Current Medication list given to you today.  *If you need a refill on your cardiac medications before your next appointment, please call your pharmacy*   Lab Work: NONE If you have labs (blood work) drawn today and your tests are completely normal, you will receive your results only by: [name] MyChart Message (if you have MyChart) OR . A paper copy in the mail If you have any lab test that is abnormal or we need to change your treatment, we will call you to review the results.   Testing/Procedures: Your physician has requested that you have an echocardiogram. Echocardiography is a painless test that uses sound waves to create images of your heart. It provides your doctor with information about the size and shape of your heart and how well your heart's chambers and valves are working. This procedure takes approximately one hour. There are no restrictions for this procedure.     Follow-Up: With Dr. Katrinka Blazing At [facility], you and your health needs are our priority.  As part of our continuing mission to provide you with exceptional heart care, we have created designated Provider Care Teams.  These Care Teams include your primary Cardiologist (physician) and Advanced Practice Providers (APPs -  Physician Assistants and Nurse Practitioners) who all work together to provide you with the care you need, when you need it.          Signed, Christell Constant, MD  09/11/2020 3:52 PM    [facility]

## 2020-09-12 ENCOUNTER — Telehealth: Payer: Self-pay | Admitting: Physician Assistant

## 2020-09-12 NOTE — Telephone Encounter (Signed)
Left message for the patient to call back and speak to the on-call preop APP of the day 

## 2020-09-12 NOTE — Telephone Encounter (Signed)
   Pt called because he had received a message from the pre-op PA asking him to return the call.  Checked on this and called the patient back.  No further eval needed prior to his colonoscopy.  Theodore Demark, PA-C 09/12/2020 6:29 PM

## 2020-09-15 NOTE — Telephone Encounter (Signed)
[facility] called. The office has not gotten any updates regarding this patient's clearance. Please fax updates to [phone]

## 2020-09-16 DIAGNOSIS — K5730 Diverticulosis of large intestine without perforation or abscess without bleeding: Secondary | ICD-10-CM | POA: Diagnosis not present

## 2020-09-16 DIAGNOSIS — Z1211 Encounter for screening for malignant neoplasm of colon: Secondary | ICD-10-CM | POA: Diagnosis not present

## 2020-09-24 ENCOUNTER — Other Ambulatory Visit: Payer: Self-pay

## 2020-09-24 ENCOUNTER — Ambulatory Visit (HOSPITAL_COMMUNITY): Payer: Medicare PPO | Attending: Cardiovascular Disease

## 2020-09-24 DIAGNOSIS — I484 Atypical atrial flutter: Secondary | ICD-10-CM | POA: Insufficient documentation

## 2020-09-24 LAB — ECHOCARDIOGRAM COMPLETE: S' Lateral: 2.5 cm

## 2020-10-02 DIAGNOSIS — N281 Cyst of kidney, acquired: Secondary | ICD-10-CM | POA: Diagnosis not present

## 2020-10-02 DIAGNOSIS — N138 Other obstructive and reflux uropathy: Secondary | ICD-10-CM | POA: Diagnosis not present

## 2020-10-02 DIAGNOSIS — N401 Enlarged prostate with lower urinary tract symptoms: Secondary | ICD-10-CM | POA: Diagnosis not present

## 2020-10-02 DIAGNOSIS — E291 Testicular hypofunction: Secondary | ICD-10-CM | POA: Diagnosis not present

## 2020-10-02 DIAGNOSIS — N529 Male erectile dysfunction, unspecified: Secondary | ICD-10-CM | POA: Diagnosis not present

## 2020-10-08 ENCOUNTER — Encounter (HOSPITAL_COMMUNITY): Payer: Self-pay | Admitting: Nurse Practitioner

## 2020-10-08 ENCOUNTER — Ambulatory Visit (HOSPITAL_COMMUNITY)
Admission: RE | Admit: 2020-10-08 | Discharge: 2020-10-08 | Disposition: A | Discharge status: Home / Self Care | Payer: Medicare PPO | Source: Ambulatory Visit | Attending: Nurse Practitioner | Admitting: Nurse Practitioner

## 2020-10-08 ENCOUNTER — Other Ambulatory Visit: Payer: Self-pay

## 2020-10-08 VITALS — BP 132/76 | HR 79 | Ht 69.0 in | Wt 165.2 lb

## 2020-10-08 DIAGNOSIS — I4892 Unspecified atrial flutter: Secondary | ICD-10-CM | POA: Insufficient documentation

## 2020-10-08 DIAGNOSIS — Z79899 Other long term (current) drug therapy: Secondary | ICD-10-CM | POA: Insufficient documentation

## 2020-10-08 DIAGNOSIS — Z7901 Long term (current) use of anticoagulants: Secondary | ICD-10-CM | POA: Insufficient documentation

## 2020-10-08 DIAGNOSIS — I4819 Other persistent atrial fibrillation: Secondary | ICD-10-CM | POA: Insufficient documentation

## 2020-10-08 DIAGNOSIS — Z8249 Family history of ischemic heart disease and other diseases of the circulatory system: Secondary | ICD-10-CM | POA: Insufficient documentation

## 2020-10-08 DIAGNOSIS — Z888 Allergy status to other drugs, medicaments and biological substances status: Secondary | ICD-10-CM | POA: Diagnosis not present

## 2020-10-08 DIAGNOSIS — D6869 Other thrombophilia: Secondary | ICD-10-CM

## 2020-10-08 LAB — BASIC METABOLIC PANEL
Anion gap: 8 (ref 5–15)
BUN: 21 mg/dL (ref 8–23)
CO2: 27 mmol/L (ref 22–32)
Calcium: 9.3 mg/dL (ref 8.9–10.3)
Chloride: 103 mmol/L (ref 98–111)
Creatinine, Ser: 0.87 mg/dL (ref 0.61–1.24)
GFR, Estimated: >60 mL/min (ref >60)
Glucose, Bld: 110 mg/dL — ABNORMAL HIGH (ref 70–99)
Potassium: 5 mmol/L (ref 3.5–5.1)
Sodium: 138 mmol/L (ref 135–145)

## 2020-10-08 LAB — CBC
HCT: 41.8 % (ref 39.0–52.0)
Hemoglobin: 13.6 g/dL (ref 13.0–17.0)
MCH: 31.1 pg (ref 26.0–34.0)
MCHC: 32.5 g/dL (ref 30.0–36.0)
MCV: 95.7 fL (ref 80.0–100.0)
Platelets: 238 10*3/uL (ref 150–400)
RBC: 4.37 MIL/uL (ref 4.22–5.81)
RDW: 13.2 % (ref 11.5–15.5)
WBC: 5.6 10*3/uL (ref 4.0–10.5)
nRBC: 0 % (ref 0.0–0.2)

## 2020-10-08 LAB — MAGNESIUM: Magnesium: 2.3 mg/dL (ref 1.7–2.4)

## 2020-10-08 NOTE — Progress Notes (Signed)
Primary Care Physician: Daisy Floro, MD Referring Physician: Dr. Johney Frame Cardiologist: Dr. Mendel Ryder    Anthony Cole is a 70 y.o. male with a h/o afib s/p ablation 08/25/17. He was being seen in the office as pt has noted return of afib since 2 days after ablation. He felt  he was tolerating his afib better since ablation. He was instructed to increase the metoprolol and his was rate controlled. He denied any swallowing difficulties and had minor bruising at the rt groin site. He was set up for successful cardioversion.   He returns to afib clinic, unfortunately has returned to afib. He believes he held in normal rhythm for about a week. No known trigger. Continues to take flecainde 100 mg bid, and xarelto, no missed doses.   F/u in afib clinic 5/10. Remains in atrial flutter at 84 bpm  He called in last week stating that the 2nd  cardioversion did not hold in SR. Discussed with Dr.Allred and he said to offer pt short term amiodarone or Tikosyn. We discussed this in clinic in detail and he believes he would rather try Tikosyn. Seems to be tolerating a flutter well.  F/u in afib clinic, 5/31. He was admitted for Tikosyn, and did convert to SR but returned to afib prior to d/c. The plan on d/c was if he continued in afib to cardiovert on tikosyn and see if he would maintain SR. Pt is in agreement. He is taking tikosyn and xareoto without missed doses.  F/u in afib clinic 02/14/18. He was in a MD office recently and irregular HB was noted. He is found to be in afib today. He has noticed some palpitations at times. He is tired when he is in afib, and tired when he is in SR.  Options discussed to restore SR.    F/u in afib clinic 06/14/2018. He is having some afib but overall is feeling weill, exercising and carrying on with life. He has been offered another ablation with Dr.Allred but does not know if he is ready for this. He continues on [place].  F/u in afib clinic, 08/14/19. He is out of  rhythm today but did not know until it was brought to his attention. He feels that he goes in and out. Much more tolerable of it after ablation and tikoysn. He has had both covid shots and feels well today. Goes to the gym on a regular basis. Continues  with xarelto 20 mg qd for a CHA2DS2VASc score of 3.  F/u in afib clinic, 02/13/20. He is in atrial flutter today. He feels he may be out of rhythm more since July. He wore a monitor last June that showed 32% afib burden. He does not feel the afib that he use to. He continues on Tikosyn. No bleeding issues.   F/u in afib clinic, 10/08/20. Pt was out of rhythm on last visit in September of last year  and the plan was to wear a monitor to see if persistent and if so, discuss stopping tikosyn as he did not feel any different in afib. He called back to office and declined the monitor. Since then he has been out of rhythm when seen by other providers so I feel he has been persistent for the last year. Last echo showed severely dilated left atrium at 5.20 cm with preserved EF. I feel his ability to restore and maintain  SR is limited due to left atrial size and the fact that he has been  in afib for the last year. He is hesitant to stop tikosyn as he feels it has some inherent benefit to his heart other than trying to keep him in SR. He would like to talk to Dr. Johney FrameAllred re repeat ablation,  but I do not think he is the best candidate and this was discussed with the pt.  He also tells me that his brother that lived [place] died recently with a stroke, with h/o  Afib, that apparently stopped taking his xarelto. He has lost around 15 lbs being more active cleaning out his house and his rental storage unit.    Today, he denies symptoms of palpitations, chest pain, shortness of breath, orthopnea, PND, lower extremity edema, dizziness, presyncope, syncope, or neurologic sequela.+ fatigue. The patient is tolerating medications without difficulties and is otherwise without  complaint today.   Past Medical History:  Diagnosis Date  . Arthritis   . Atrial fibrillation (HCC)   . Diabetes mellitus without complication (HCC)    type 2  . Hyperlipidemia   . Hypertension   . Hypothyroidism   . Obesity (BMI 30-39.9) 01/25/2016  . OSA on CPAP 07/16/2014   Moderate with AHI 19/hr on CPAP at 8cm H2O  . Pre-diabetes   . Visit for monitoring Tikosyn therapy 10/25/2017   Past Surgical History:  Procedure Laterality Date  . ATRIAL FIBRILLATION ABLATION N/A 08/25/2017   Procedure: ATRIAL FIBRILLATION ABLATION;  Surgeon: Hillis RangeAllred, James, MD;  Location: [facility];  Service: Cardiovascular;  Laterality: N/A;  . CARDIOVERSION  11/04/2011   Procedure: CARDIOVERSION;  Surgeon: Lesleigh NoeHenry W Smith III, MD;  [facility];  Service: Cardiovascular;  Laterality: N/A;  . CARDIOVERSION N/A 07/02/2013   Procedure: CARDIOVERSION;  Surgeon: Lesleigh NoeHenry W Smith III, MD;  [facility];  Service: Cardiovascular;  Laterality: N/A;  10:07 elective cardioversion Lido 40mg , Propofol 60mg ,IV...200 joules, synched, pt presently in A flutter,...cardioverted to NSR...  . CARDIOVERSION N/A 09/19/2013   Procedure: CARDIOVERSION;  Surgeon: Lesleigh NoeHenry W Smith III, MD;  [facility];  Service: Cardiovascular;  Laterality: N/A;  . CARDIOVERSION N/A 08/13/2014   Procedure: CARDIOVERSION;  Surgeon: Lars MassonKatarina H Nelson, MD;  [facility];  Service: Cardiovascular;  Laterality: N/A;  . CARDIOVERSION N/A 05/23/2017   Procedure: CARDIOVERSION;  Surgeon: Vesta MixerNahser, Philip J, MD;  [facility];  Service: Cardiovascular;  Laterality: N/A;  . CARDIOVERSION N/A 09/09/2017   Procedure: CARDIOVERSION;  Surgeon: Pricilla Riffleoss, Paula V, MD;  [facility];  Service: Cardiovascular;  Laterality: N/A;  . CARDIOVERSION N/A 09/30/2017   Procedure: CARDIOVERSION;  Surgeon: Laqueta LindenKoneswaran, Suresh A, MD;  [facility];  Service: Cardiovascular;  Laterality: N/A;  . EYE SURGERY    . FINGER FRACTURE  SURGERY Left "when I was a kid"  . FRACTURE SURGERY    . KNEE ARTHROSCOPY W/ DEBRIDEMENT Bilateral   . RETINAL LASER PROCEDURE Bilateral    "tears on both; not detachments"  . SHOULDER ARTHROSCOPY W/ ROTATOR CUFF REPAIR Bilateral   . STAPEDES SURGERY Right   . TONSILLECTOMY    . TYMPANOPLASTY Bilateral    "skin grafts to ear drums"    Current Outpatient Medications  Medication Sig Dispense Refill  . acyclovir (ZOVIRAX) 200 MG capsule Take 200 mg by mouth 2 (two) times daily as needed (first sign of fever blister).     . Cholecalciferol (VITAMIN D3 PO) Take 5,000 Units by mouth daily.     [name]. diltiazem (CARDIZEM) 30 MG tablet Take 1 tablet every 4  hours AS NEEDED for AFIB heart rate >100 as long as top number of blood pressure >100. 45 tablet 1  . dofetilide (TIKOSYN) 500 MCG capsule TAKE (1) CAPSULE TWICE DAILY. 180 capsule 2  . doxycycline (VIBRA-TABS) 100 MG tablet Take 100 mg by mouth in the morning and at bedtime.    [name] levothyroxine (SYNTHROID, LEVOTHROID) 88 MCG tablet Take 88 mcg by mouth daily before breakfast.     . losartan (COZAAR) 100 MG tablet TAKE 1 TABLET ONCE DAILY. 90 tablet 3  . metoprolol succinate (TOPROL-XL) 25 MG 24 hr tablet Take 1 tablet (25 mg total) by mouth daily. 90 tablet 3  . Multiple Vitamins-Minerals (PRESERVISION AREDS 2+MULTI VIT PO) Take 1 capsule by mouth 2 (two) times daily.    . rosuvastatin (CRESTOR) 20 MG tablet TAKE 1 TABLET ONCE DAILY. 90 tablet 3  . spironolactone (ALDACTONE) 25 MG tablet Take 0.5 tablets (12.5 mg total) by mouth daily. 45 tablet 3  . tretinoin (RETIN-A) 0.025 % cream Apply 1 application topically at bedtime.    [name] 20 MG TABS tablet TAKE 1 TABLET ONCE DAILY IN THE EVENING. 90 tablet 2   Current Facility-Administered Medications  Medication Dose Route Frequency Provider Last Rate Last Admin  . 0.9 %  sodium chloride infusion  250 mL Intravenous Continuous [name], MD      . sodium chloride flush (NS) 0.9 % injection  3 mL  3 mL Intravenous Q12H [name], MD      . sodium chloride flush (NS) 0.9 % injection 3 mL  3 mL Intravenous PRN [name], MD        Allergies  Allergen Reactions  . Gabapentin Other (See Comments)    Causes Extreme sedation   . Other     Not sure of name of med-for overactive bladder-caused some adverse reactions, stopped taking immediately-per patient  . Atorvastatin Other (See Comments)  . Tamsulosin Hcl Other (See Comments)    Social History   Socioeconomic History  . Marital status: Single    Spouse name: Not on file  . Number of children: Not on file  . Years of education: Not on file  . Highest education level: Not on file  Occupational History  . Not on file  Tobacco Use  . Smoking status: Never Smoker  . Smokeless tobacco: Never Used  Vaping Use  . Vaping Use: Never used  Substance and Sexual Activity  . Alcohol use: No  . Drug use: No  . Sexual activity: Not Currently  Other Topics Concern  . Not on file  Social History Narrative  . Not on file   Social Determinants of Health   Financial Resource Strain: Not on file  Food Insecurity: Not on file  Transportation Needs: Not on file  Physical Activity: Not on file  Stress: Not on file  Social Connections: Not on file  Intimate Partner Violence: Not on file    Family History  Problem Relation Age of Onset  . Hypertension Mother   . Stroke Maternal Grandmother     ROS- All systems are reviewed and negative except as per the HPI above  Physical Exam: Vitals:   10/08/20 1042  BP: 132/76  Pulse: 79  Weight: 74.9 kg  Height: 5\' 9"  (1.753 m)   Wt Readings from Last 3 Encounters:  10/08/20 74.9 kg  09/11/20 73 kg  05/19/20 79.4 kg    Labs: Lab Results  Component Value Date   NA  138 10/08/2020   K 5.0 10/08/2020   CL 103 10/08/2020   CO2 27 10/08/2020   GLUCOSE 110 (H) 10/08/2020   BUN 21 10/08/2020   CREATININE 0.87 10/08/2020   CALCIUM 9.3 10/08/2020   MG 2.3  10/08/2020   Lab Results  Component Value Date   INR 1.4 (H) 05/18/2017   Lab Results  Component Value Date   CHOL 131 08/10/2018   HDL 39 (L) 08/10/2018   LDLCALC 74 08/10/2018   TRIG 91 08/10/2018     GEN- The patient is well appearing, alert and oriented x 3 today.   Head- normocephalic, atraumatic Eyes-  Sclera clear, conjunctiva pink Ears- hearing intact Oropharynx- clear Neck- supple, no JVP Lymph- no cervical lymphadenopathy Lungs- Clear to ausculation bilaterally, normal work of breathing Heart- irregular rate and rhythm, no murmurs, rubs or gallops, PMI not laterally displaced GI- soft, NT, ND, + BS Extremities- no clubbing, cyanosis, or edema MS- no significant deformity or atrophy Skin- no rash or lesion Psych- euthymic mood, full affect Neuro- strength and sensation are intact  EKG-  atrial flutter at 93 bpm, qt interval at 467 ms    Assessment and Plan: 1.Persisitent afib/flutter s/p ablation 08/25/17  Cardioversion x 2, s/p ablation, with ERAF Then admitted for Tikosyn, initially converted but then with ERAF  Now afib for at least a year  Feel CV will not be successful to restore SR With  severely dilated Left atrium and persistent  afib x one year,  I dont believe  he will be the best repeat ablation candidate Also  discussed stopping tikosyn, which he is hesitant to do as he feels it has an inherent benefit for the heart. He has failed tikosyn for a long time so if rate control is determined to be the best strategy  going forward , it will need to be stopped  We also discussed amiodarone, but would have to carry him for many years on this drug with potential side effects   Conitnue dofetilide 500 mcg bid for now as he would like to discuss all this again with Dr. Johney Frame  Continue metoprolol 25 mg qd  Continue xarelto  20 mg a daily for CHA2DS2VASc of 2 Bmet/mag/cbc today   Appointment requested with Dr. Nehemiah Settle C. Matthew Folks Afib  Clinic [facility] [address] [place], [place] [address] [phone]

## 2020-10-12 NOTE — Progress Notes (Signed)
Cardiology Office Note:    Date:  10/14/2020   ID:  Anthony Cole, DOB Mar 27, 1951, MRN [id number]  PCP:  Daisy Floro, MD  Cardiologist:  Lesleigh Noe, MD   Referring MD: Daisy Floro, MD   Chief Complaint  Patient presents with  . Atrial Fibrillation  . Coronary Artery Disease    History of Present Illness:    Anthony Cole is a 70 y.o. male with a hx of obstructive sleep apneaon CPAP, hypertension, hyperlipidemia,CAD with coronary calcium score 250, diabetes mellitusII,paroxysmal atrial fibrillations/p ablation 08/2017& recurrent AFIB --> Tykosyn10/07/2017.Repeat ablation discussed but not felt to be successful.Needs to discuss Tikosyn DC.   He is here today despite recent follow-up in the atrial fibrillation clinic.  He is in continuous atrial fibrillation.  Was offered a second ablation but refused.  He has been in continuous atrial fibrillation now for months.  He is concerned about a repeat echo that demonstrated an increase in right atrial size from "normal to severely dilated".  He continues to take dofetilide despite continuous atrial fibrillation.  He denies angina.  His brother with a history of atrial fibrillation had a large left brain stroke.  Further investigation found that he has stopped taking therapies including anticoagulation several years before the event.  Ride had to make the decision to have him taken off life support.  He becomes emotional thinking about this even though it occurred several months ago.  Past Medical History:  Diagnosis Date  . Arthritis   . Atrial fibrillation (HCC)   . Diabetes mellitus without complication (HCC)    type 2  . Hyperlipidemia   . Hypertension   . Hypothyroidism   . Obesity (BMI 30-39.9) 01/25/2016  . OSA on CPAP 07/16/2014   Moderate with AHI 19/hr on CPAP at 8cm H2O  . Pre-diabetes   . Visit for monitoring Tikosyn therapy 10/25/2017    Past Surgical History:  Procedure Laterality Date   . ATRIAL FIBRILLATION ABLATION N/A 08/25/2017   Procedure: ATRIAL FIBRILLATION ABLATION;  Surgeon: Hillis Range, MD;  [facility]: [facility];  Service: Cardiovascular;  Laterality: N/A;  . CARDIOVERSION  11/04/2011   Procedure: CARDIOVERSION;  Surgeon: Lesleigh Noe, MD;  Location: [facility] OR;  Service: Cardiovascular;  Laterality: N/A;  . CARDIOVERSION N/A 07/02/2013   Procedure: CARDIOVERSION;  Surgeon: Lesleigh Noe, MD;  Location: [facility] ENDOSCOPY;  Service: Cardiovascular;  Laterality: N/A;  10:07 elective cardioversion Lido 40mg , Propofol 60mg ,IV...200 joules, synched, pt presently in A flutter,...cardioverted to NSR...  . CARDIOVERSION N/A 09/19/2013   Procedure: CARDIOVERSION;  Surgeon: , MD;  Location: [facility] ENDOSCOPY;  Service: Cardiovascular;  Laterality: N/A;  . CARDIOVERSION N/A 08/13/2014   Procedure: CARDIOVERSION;  Surgeon: [facility], MD;  Location: [facility] ENDOSCOPY;  Service: Cardiovascular;  Laterality: N/A;  . CARDIOVERSION N/A 05/23/2017   Procedure: CARDIOVERSION;  Surgeon: [facility], MD;  Location: [facility] ENDOSCOPY;  Service: Cardiovascular;  Laterality: N/A;  . CARDIOVERSION N/A 09/09/2017   Procedure: CARDIOVERSION;  Surgeon: [facility], MD;  Location: [facility] ENDOSCOPY;  Service: Cardiovascular;  Laterality: N/A;  . CARDIOVERSION N/A 09/30/2017   Procedure: CARDIOVERSION;  Surgeon: [facility], MD;  Location: [facility] ENDOSCOPY;  Service: Cardiovascular;  Laterality: N/A;  . EYE SURGERY    . FINGER FRACTURE SURGERY Left "when I was a kid"  . FRACTURE SURGERY    . KNEE ARTHROSCOPY W/ DEBRIDEMENT Bilateral   . RETINAL LASER PROCEDURE Bilateral    "  tears on both; not detachments"  . SHOULDER ARTHROSCOPY W/ ROTATOR CUFF REPAIR Bilateral   . STAPEDES SURGERY Right   . TONSILLECTOMY    . TYMPANOPLASTY Bilateral    "skin grafts to ear drums"    Current Medications: Current Meds  Medication Sig  . acyclovir (ZOVIRAX) 200 MG capsule Take 200 mg by  mouth 2 (two) times daily as needed (first sign of fever blister).   . Cholecalciferol (VITAMIN D3 PO) Take 5,000 Units by mouth daily.   [name]. dofetilide (TIKOSYN) 500 MCG capsule TAKE (1) CAPSULE TWICE DAILY.  [name]. doxycycline (VIBRA-TABS) 100 MG tablet Take 100 mg by mouth in the morning and at bedtime.  [name]. levothyroxine (SYNTHROID, LEVOTHROID) 88 MCG tablet Take 88 mcg by mouth daily before breakfast.   . losartan (COZAAR) 100 MG tablet TAKE 1 TABLET ONCE DAILY.  . metoprolol succinate (TOPROL-XL) 50 MG 24 hr tablet Take 1 tablet (50 mg total) by mouth daily. Take with or immediately following a meal.  . rosuvastatin (CRESTOR) 20 MG tablet TAKE 1 TABLET ONCE DAILY.  [name]. spironolactone (ALDACTONE) 25 MG tablet Take 0.5 tablets (12.5 mg total) by mouth daily.  [name]. XARELTO 20 MG TABS tablet TAKE 1 TABLET ONCE DAILY IN THE EVENING.  . [DISCONTINUED] diltiazem (CARDIZEM) 30 MG tablet Take 1 tablet every 4 hours AS NEEDED for AFIB heart rate >100 as long as top number of blood pressure >100.  . [DISCONTINUED] metoprolol succinate (TOPROL-XL) 25 MG 24 hr tablet Take 1 tablet (25 mg total) by mouth daily.  . [DISCONTINUED] Multiple Vitamins-Minerals (PRESERVISION AREDS 2+MULTI VIT PO) Take 1 capsule by mouth 2 (two) times daily.  . [DISCONTINUED] tretinoin (RETIN-A) 0.025 % cream Apply 1 application topically at bedtime.   Current Facility-Administered Medications for the 10/14/20 encounter (Office Visit) with Lyn RecordsSmith, Maurie Musco W, MD  Medication  . 0.9 %  sodium chloride infusion  . sodium chloride flush (NS) 0.9 % injection 3 mL  . sodium chloride flush (NS) 0.9 % injection 3 mL     Allergies:   Gabapentin, Other, Atorvastatin, and Tamsulosin hcl   Social History   Socioeconomic History  . Marital status: Single    Spouse name: Not on file  . Number of children: Not on file  . Years of education: Not on file  . Highest education level: Not on file  Occupational History  . Not on file  Tobacco Use  .  Smoking status: Never Smoker  . Smokeless tobacco: Never Used  Vaping Use  . Vaping Use: Never used  Substance and Sexual Activity  . Alcohol use: No  . Drug use: No  . Sexual activity: Not Currently  Other Topics Concern  . Not on file  Social History Narrative  . Not on file   Social Determinants of Health   Financial Resource Strain: Not on file  Food Insecurity: Not on file  Transportation Needs: Not on file  Physical Activity: Not on file  Stress: Not on file  Social Connections: Not on file     Family History: The patient's family history includes Hypertension in his mother; Stroke in his brother and maternal grandmother.  ROS:   Please see the history of present illness.    He has multiple anxieties.  He is lost a lot of weight related to his problems death but also because he has changed his diet and increase physical activity.  He wants to do what ever he can to keep his heart strong.  All other  systems reviewed and are negative.  EKGs/Labs/Other Studies Reviewed:    The following studies were reviewed today:  Last echo performed in April 2022: IMPRESSIONS    1. Left ventricular ejection fraction, by estimation, is 60 to 65%. The  left ventricle has normal function. The left ventricle has no regional  wall motion abnormalities. There is moderate concentric left ventricular  hypertrophy. Left ventricular  diastolic function could not be evaluated.  2. Right ventricular systolic function is normal. The right ventricular  size is normal. Tricuspid regurgitation signal is inadequate for assessing  PA pressure.  3. Left atrial size was severely dilated.  4. Right atrial size was severely dilated.  5. The mitral valve is normal in structure. No evidence of mitral valve  regurgitation.  6. The aortic valve is tricuspid. Aortic valve regurgitation is not  visualized. Mild aortic valve sclerosis is present, with no evidence of  aortic valve stenosis.  7.  Aortic dilatation noted. There is mild dilatation of the aortic root,  measuring 41 mm. There is borderline dilatation of the ascending aorta,  measuring 37 mm.  8. The inferior vena cava is dilated in size with <50% respiratory  variability, suggesting right atrial pressure of 15 mmHg.   Comparison(s): No significant change from prior study. Prior images  reviewed side by side.    EKG:  EKG performed 10/08/2020 revealed atrial flutter with variable AV block.  Which was unchanged compared with April 2022.  Recent Labs: 10/08/2020: BUN 21; Creatinine, Ser 0.87; Hemoglobin 13.6; Magnesium 2.3; Platelets 238; Potassium 5.0; Sodium 138  Recent Lipid Panel    Component Value Date/Time   CHOL 131 08/10/2018 0926   TRIG 91 08/10/2018 0926   HDL 39 (L) 08/10/2018 0926   CHOLHDL 3.4 08/10/2018 0926   LDLCALC 74 08/10/2018 0926    Physical Exam:    VS:  BP 122/82   Pulse 94   Ht 5\' 9"  (1.753 m)   Wt 165 lb (74.8 kg)   SpO2 98%   BMI 24.37 kg/m     Wt Readings from Last 3 Encounters:  10/14/20 165 lb (74.8 kg)  10/08/20 165 lb 3.2 oz (74.9 kg)  09/11/20 161 lb (73 kg)     GEN: BMI is normal.. No acute distress HEENT: Normal NECK: No JVD. LYMPHATICS: No lymphadenopathy CARDIAC: No murmur. IIRR no gallop, or edema. VASCULAR:  Normal Pulses. No bruits. RESPIRATORY:  Clear to auscultation without rales, wheezing or rhonchi  ABDOMEN: Soft, non-tender, non-distended, No pulsatile mass, MUSCULOSKELETAL: No deformity  SKIN: Warm and dry NEUROLOGIC:  Alert and oriented x 3 PSYCHIATRIC:  Normal affect   ASSESSMENT:    1. Persistent atrial fibrillation (HCC)   2. Secondary hypercoagulable state (HCC)   3. Hypertension associated with diabetes (HCC)   4. OSA (obstructive sleep apnea)   5. Aortic atherosclerosis (HCC)   6. Hyperlipidemia associated with type 2 diabetes mellitus (HCC)   7. Long term current use of anticoagulant therapy   8. Visit for monitoring Tikosyn therapy     PLAN:    In order of problems listed above:  1. Currently in atrial flutter with variable AV conduction.  This appears to be persistent.  Is being managed by EP.  He asked my opinion concerning dofetilide therapy.  I feel that he should be off dofetilide if his not maintaining rhythm.  Recommended that he increase Toprol-XL to 50 mg/day for better blood pressure control and better rate control. 2. Continue anticoagulation therapy, Xarelto 20 mg/day.  3. Better blood pressure control is desired.  Left arm blood pressure today 145/80 mmHg.  Increase Toprol-XL to 50 mg/day. 4. Compliance with CPAP. 5. High intensity Crestor 20 mg/day. 6. Lipids as discussed. 7. Xarelto 20 mg daily 8. Consider discontinuation of Tikosyn  Follow-up with EP.  He wondered aloud if he should go back to [facility] with Dr. Macon Large.  I simply explained to him that he has all potential dense and it depends upon what he feels most comfortable with.  I do not think he would hurt anyone's feelings in either direction concerning what his final decision might be.  Extremely prolonged office visit.  Greater than 50% of the time was spent in counseling.  Much of this was driven by the patient's anxiety concerning his underlying condition exacerbated by his brothers recent death.   Medication Adjustments/Labs and Tests Ordered: Current medicines are reviewed at length with the patient today.  Concerns regarding medicines are outlined above.  No orders of the defined types were placed in this encounter.  Meds ordered this encounter  Medications  . metoprolol succinate (TOPROL-XL) 50 MG 24 hr tablet    Sig: Take 1 tablet (50 mg total) by mouth daily. Take with or immediately following a meal.    Dispense:  90 tablet    Refill:  3    Patient Instructions  Medication Instructions:  Increase metoprolol succinate to 50 mg daily *If you need a refill on your cardiac medications before your next appointment, please call your  pharmacy*   Lab Work: None If you have labs (blood work) drawn today and your tests are completely normal, you will receive your results only by: [name] MyChart Message (if you have MyChart) OR . A paper copy in the mail If you have any lab test that is abnormal or we need to change your treatment, we will call you to review the results.   Testing/Procedures: None   Follow-Up: At [facility], you and your health needs are our priority.  As part of our continuing mission to provide you with exceptional heart care, we have created designated Provider Care Teams.  These Care Teams include your primary Cardiologist (physician) and Advanced Practice Providers (APPs -  Physician Assistants and Nurse Practitioners) who all work together to provide you with the care you need, when you need it.  We recommend signing up for the patient portal called "MyChart".  Sign up information is provided on this After Visit Summary.  MyChart is used to connect with patients for Virtual Visits (Telemedicine).  Patients are able to view lab/test results, encounter notes, upcoming appointments, etc.  Non-urgent messages can be sent to your provider as well.   To learn more about what you can do with MyChart, go to [email or web address].    Your next appointment:   9 month(s)  The format for your next appointment:   In Person  Provider:   You may see Lesleigh Noe, MD or one of the following Advanced Practice Providers on your designated Care Team:    Georgie Chard, NP    Other Instructions      Signed, Lesleigh Noe, MD  10/14/2020 5:20 PM    [facility]

## 2020-10-14 ENCOUNTER — Ambulatory Visit: Payer: Medicare PPO | Admitting: Interventional Cardiology

## 2020-10-14 ENCOUNTER — Encounter: Payer: Self-pay | Admitting: Interventional Cardiology

## 2020-10-14 ENCOUNTER — Other Ambulatory Visit: Payer: Self-pay

## 2020-10-14 VITALS — BP 122/82 | HR 94 | Ht 69.0 in | Wt 165.0 lb

## 2020-10-14 DIAGNOSIS — D6869 Other thrombophilia: Secondary | ICD-10-CM

## 2020-10-14 DIAGNOSIS — I152 Hypertension secondary to endocrine disorders: Secondary | ICD-10-CM | POA: Diagnosis not present

## 2020-10-14 DIAGNOSIS — I4819 Other persistent atrial fibrillation: Secondary | ICD-10-CM

## 2020-10-14 DIAGNOSIS — Z7901 Long term (current) use of anticoagulants: Secondary | ICD-10-CM

## 2020-10-14 DIAGNOSIS — I700 Atherosclerosis of aorta: Secondary | ICD-10-CM

## 2020-10-14 DIAGNOSIS — Z5181 Encounter for therapeutic drug level monitoring: Secondary | ICD-10-CM

## 2020-10-14 DIAGNOSIS — E1169 Type 2 diabetes mellitus with other specified complication: Secondary | ICD-10-CM | POA: Diagnosis not present

## 2020-10-14 DIAGNOSIS — E1159 Type 2 diabetes mellitus with other circulatory complications: Secondary | ICD-10-CM

## 2020-10-14 DIAGNOSIS — E785 Hyperlipidemia, unspecified: Secondary | ICD-10-CM

## 2020-10-14 DIAGNOSIS — G4733 Obstructive sleep apnea (adult) (pediatric): Secondary | ICD-10-CM | POA: Diagnosis not present

## 2020-10-14 DIAGNOSIS — Z79899 Other long term (current) drug therapy: Secondary | ICD-10-CM

## 2020-10-14 MED ORDER — METOPROLOL SUCCINATE ER 50 MG PO TB24: 50.0000 mg | ORAL_TABLET | Freq: Every day | ORAL | 3 refills | Status: DC

## 2020-10-14 NOTE — Patient Instructions (Signed)
Medication Instructions:  Increase metoprolol succinate to 50 mg daily *If you need a refill on your cardiac medications before your next appointment, please call your pharmacy*   Lab Work: None If you have labs (blood work) drawn today and your tests are completely normal, you will receive your results only by: [name] MyChart Message (if you have MyChart) OR . A paper copy in the mail If you have any lab test that is abnormal or we need to change your treatment, we will call you to review the results.   Testing/Procedures: None   Follow-Up: At [facility], you and your health needs are our priority.  As part of our continuing mission to provide you with exceptional heart care, we have created designated Provider Care Teams.  These Care Teams include your primary Cardiologist (physician) and Advanced Practice Providers (APPs -  Physician Assistants and Nurse Practitioners) who all work together to provide you with the care you need, when you need it.  We recommend signing up for the patient portal called "MyChart".  Sign up information is provided on this After Visit Summary.  MyChart is used to connect with patients for Virtual Visits (Telemedicine).  Patients are able to view lab/test results, encounter notes, upcoming appointments, etc.  Non-urgent messages can be sent to your provider as well.   To learn more about what you can do with MyChart, go to [email or web address].    Your next appointment:   9 month(s)  The format for your next appointment:   In Person  Provider:   You may see Lesleigh Noe, MD or one of the following Advanced Practice Providers on your designated Care Team:    Georgie Chard, NP    Other Instructions

## 2020-10-17 ENCOUNTER — Encounter: Payer: Self-pay | Admitting: Internal Medicine

## 2020-10-17 ENCOUNTER — Other Ambulatory Visit: Payer: Self-pay

## 2020-10-17 ENCOUNTER — Ambulatory Visit: Payer: Medicare PPO | Admitting: Internal Medicine

## 2020-10-17 VITALS — BP 142/70 | HR 61 | Ht 69.0 in | Wt 165.0 lb

## 2020-10-17 DIAGNOSIS — G4733 Obstructive sleep apnea (adult) (pediatric): Secondary | ICD-10-CM | POA: Diagnosis not present

## 2020-10-17 DIAGNOSIS — D6869 Other thrombophilia: Secondary | ICD-10-CM

## 2020-10-17 DIAGNOSIS — I10 Essential (primary) hypertension: Secondary | ICD-10-CM

## 2020-10-17 DIAGNOSIS — I4819 Other persistent atrial fibrillation: Secondary | ICD-10-CM | POA: Diagnosis not present

## 2020-10-17 NOTE — Patient Instructions (Signed)
Medication Instructions:  Your physician recommends that you continue on your current medications as directed. Please refer to the Current Medication list given to you today.  Labwork: None ordered.  Testing/Procedures: None ordered.  Follow-Up: Your physician wants you to follow-up in: 3 month follow up with the Afib Clinic. They will contact you to schedule.    Any Other Special Instructions Will Be Listed Below (If Applicable).  If you need a refill on your cardiac medications before your next appointment, please call your pharmacy.

## 2020-10-17 NOTE — Progress Notes (Signed)
PCP: Daisy Floro, MD Primary Cardiologist: Dr Katrinka Blazing Primary EP: Dr Johney Frame  Anthony Cole is a 70 y.o. male who presents today for routine electrophysiology followup.  Since last being seen in our clinic, the patient reports doing reasonably well.  He has been persistently in afib for the past year.  + palpitations but mostly minimally symptomatic.   Today, he denies symptoms of chest pain, shortness of breath,  lower extremity edema, dizziness, presyncope, or syncope.  The patient is otherwise without complaint today.   Past Medical History:  Diagnosis Date  . Arthritis   . Atrial fibrillation (HCC)   . Diabetes mellitus without complication (HCC)    type 2  . Hyperlipidemia   . Hypertension   . Hypothyroidism   . Obesity (BMI 30-39.9) 01/25/2016  . OSA on CPAP 07/16/2014   Moderate with AHI 19/hr on CPAP at 8cm H2O  . Pre-diabetes   . Visit for monitoring Tikosyn therapy 10/25/2017   Past Surgical History:  Procedure Laterality Date  . ATRIAL FIBRILLATION ABLATION N/A 08/25/2017   Procedure: ATRIAL FIBRILLATION ABLATION;  Surgeon: Hillis Range, MD;  Location: [facility] INVASIVE CV LAB;  Service: Cardiovascular;  Laterality: N/A;  . CARDIOVERSION  11/04/2011   Procedure: CARDIOVERSION;  Surgeon: Lesleigh Noe, MD;  Location: [facility] OR;  Service: Cardiovascular;  Laterality: N/A;  . CARDIOVERSION N/A 07/02/2013   Procedure: CARDIOVERSION;  Surgeon: Lesleigh Noe, MD;  Location: [facility] ENDOSCOPY;  Service: Cardiovascular;  Laterality: N/A;  10:07 elective cardioversion Lido 40mg , Propofol 60mg ,IV...200 joules, synched, pt presently in A flutter,...cardioverted to NSR...  . CARDIOVERSION N/A 09/19/2013   Procedure: CARDIOVERSION;  Surgeon: , MD;  Location: [facility] ENDOSCOPY;  Service: Cardiovascular;  Laterality: N/A;  . CARDIOVERSION N/A 08/13/2014   Procedure: CARDIOVERSION;  Surgeon: [facility], MD;  Location: [facility] ENDOSCOPY;  Service: Cardiovascular;  Laterality:  N/A;  . CARDIOVERSION N/A 05/23/2017   Procedure: CARDIOVERSION;  Surgeon: [facility], MD;  Location: [facility] ENDOSCOPY;  Service: Cardiovascular;  Laterality: N/A;  . CARDIOVERSION N/A 09/09/2017   Procedure: CARDIOVERSION;  Surgeon: [facility], MD;  Location: [facility] ENDOSCOPY;  Service: Cardiovascular;  Laterality: N/A;  . CARDIOVERSION N/A 09/30/2017   Procedure: CARDIOVERSION;  Surgeon: [facility], MD;  Location: [facility] ENDOSCOPY;  Service: Cardiovascular;  Laterality: N/A;  . EYE SURGERY    . FINGER FRACTURE SURGERY Left "when I was a kid"  . FRACTURE SURGERY    . KNEE ARTHROSCOPY W/ DEBRIDEMENT Bilateral   . RETINAL LASER PROCEDURE Bilateral    "tears on both; not detachments"  . SHOULDER ARTHROSCOPY W/ ROTATOR CUFF REPAIR Bilateral   . STAPEDES SURGERY Right   . TONSILLECTOMY    . TYMPANOPLASTY Bilateral    "skin grafts to ear drums"    ROS- all systems are reviewed and negatives except as per HPI above  Current Outpatient Medications  Medication Sig Dispense Refill  . acyclovir (ZOVIRAX) 200 MG capsule Take 200 mg by mouth 2 (two) times daily as needed (first sign of fever blister).     . Cholecalciferol (VITAMIN D3 PO) Take 5,000 Units by mouth daily.     Laqueta Linden dofetilide (TIKOSYN) 500 MCG capsule TAKE (1) CAPSULE TWICE DAILY. 180 capsule 2  . doxycycline (VIBRA-TABS) 100 MG tablet Take 100 mg by mouth in the morning and at bedtime.    [facility] levothyroxine (SYNTHROID, LEVOTHROID) 88 MCG tablet Take 88 mcg by mouth daily before breakfast.     .  losartan (COZAAR) 100 MG tablet TAKE 1 TABLET ONCE DAILY. 90 tablet 3  . metoprolol succinate (TOPROL-XL) 50 MG 24 hr tablet Take 1 tablet (50 mg total) by mouth daily. Take with or immediately following a meal. 90 tablet 3  . rosuvastatin (CRESTOR) 20 MG tablet TAKE 1 TABLET ONCE DAILY. 90 tablet 3  . spironolactone (ALDACTONE) 25 MG tablet Take 0.5 tablets (12.5 mg total) by mouth daily. 45 tablet 3  . XARELTO 20 MG TABS tablet TAKE 1  TABLET ONCE DAILY IN THE EVENING. 90 tablet 2   Current Facility-Administered Medications  Medication Dose Route Frequency Provider Last Rate Last Admin  . 0.9 %  sodium chloride infusion  250 mL Intravenous Continuous [name], MD      . sodium chloride flush (NS) 0.9 % injection 3 mL  3 mL Intravenous Q12H [name], MD      . sodium chloride flush (NS) 0.9 % injection 3 mL  3 mL Intravenous PRN [name], MD        Physical Exam: Vitals:   10/17/20 1120  BP: (!) 142/70  Pulse: 61  SpO2: 95%  Weight: 165 lb (74.8 kg)  Height: 5\' 9"  (1.753 m)    GEN- The patient is well appearing, alert and oriented x 3 today.   Head- normocephalic, atraumatic Eyes-  Sclera clear, conjunctiva pink Ears- hearing intact Oropharynx- clear Lungs- Clear to ausculation bilaterally, normal work of breathing Heart- Regular rate and rhythm, no murmurs, rubs or gallops, PMI not laterally displaced GI- soft, NT, ND, + BS Extremities- no clubbing, cyanosis, or edema  Wt Readings from Last 3 Encounters:  10/17/20 165 lb (74.8 kg)  10/14/20 165 lb (74.8 kg)  10/08/20 165 lb 3.2 oz (74.9 kg)    Echo 09/24/20- EF 60%, moderate LVH, severe biatrial enlargement  Assessment and Plan:  1. Persistent afib S/p prior ablation 08/2017 The patient has symptomatic, recurrent persistent atrial fibrillation.  He has severe biatrial enlargement he has failed medical therapy with tikosyn. Chads2vasc score is at least 3.  he is anticoagulated with  xarelto .  We discussed his options of rate control and rhythm control (ablation at length).  We discussed MAZE also.  I do not feel that his symptoms warrant consideration for MAZE currently.  Risk, benefits, and alternatives to EP study and radiofrequency ablation for afib were also discussed in detail today. These risks include but are not limited to stroke, bleeding, vascular damage, tamponade, perforation, damage to the esophagus, lungs, and other  structures, pulmonary vein stenosis, worsening renal function, and death. The patient understands these risk and wishes to think about this further.  He is not ready to commit to the procedure today. I have advised (as has Dr 09/2017 and Katrinka Blazing) that we should stop tikosyn if he does not proceed with ablation.  2. HTN Stable No change required today  3. OSA Stable No change required today   Risks, benefits and potential toxicities for medications prescribed and/or refilled reviewed with patient today.   Follow-up in the AF clinic in 3 months He will contact my office if he decides to proceed with ablation in the interim.  Rudi Coco MD, [facility] 10/17/2020 11:20 AM

## 2020-10-22 DIAGNOSIS — L821 Other seborrheic keratosis: Secondary | ICD-10-CM | POA: Diagnosis not present

## 2020-10-22 DIAGNOSIS — D485 Neoplasm of uncertain behavior of skin: Secondary | ICD-10-CM | POA: Diagnosis not present

## 2020-10-22 DIAGNOSIS — L570 Actinic keratosis: Secondary | ICD-10-CM | POA: Diagnosis not present

## 2020-10-22 DIAGNOSIS — D225 Melanocytic nevi of trunk: Secondary | ICD-10-CM | POA: Diagnosis not present

## 2020-10-22 DIAGNOSIS — L814 Other melanin hyperpigmentation: Secondary | ICD-10-CM | POA: Diagnosis not present

## 2020-10-22 DIAGNOSIS — D235 Other benign neoplasm of skin of trunk: Secondary | ICD-10-CM | POA: Diagnosis not present

## 2020-12-03 DIAGNOSIS — I4819 Other persistent atrial fibrillation: Secondary | ICD-10-CM | POA: Diagnosis not present

## 2020-12-03 DIAGNOSIS — Z8679 Personal history of other diseases of the circulatory system: Secondary | ICD-10-CM | POA: Diagnosis not present

## 2020-12-03 DIAGNOSIS — Z9889 Other specified postprocedural states: Secondary | ICD-10-CM | POA: Diagnosis not present

## 2020-12-03 DIAGNOSIS — I480 Paroxysmal atrial fibrillation: Secondary | ICD-10-CM | POA: Diagnosis not present

## 2020-12-03 DIAGNOSIS — Z7901 Long term (current) use of anticoagulants: Secondary | ICD-10-CM | POA: Diagnosis not present

## 2020-12-12 DIAGNOSIS — E039 Hypothyroidism, unspecified: Secondary | ICD-10-CM | POA: Diagnosis not present

## 2020-12-12 DIAGNOSIS — R002 Palpitations: Secondary | ICD-10-CM | POA: Diagnosis not present

## 2020-12-12 DIAGNOSIS — Z1159 Encounter for screening for other viral diseases: Secondary | ICD-10-CM | POA: Diagnosis not present

## 2020-12-12 DIAGNOSIS — I10 Essential (primary) hypertension: Secondary | ICD-10-CM | POA: Diagnosis not present

## 2020-12-12 DIAGNOSIS — E7800 Pure hypercholesterolemia, unspecified: Secondary | ICD-10-CM | POA: Diagnosis not present

## 2020-12-12 DIAGNOSIS — R7303 Prediabetes: Secondary | ICD-10-CM | POA: Diagnosis not present

## 2020-12-12 DIAGNOSIS — R739 Hyperglycemia, unspecified: Secondary | ICD-10-CM | POA: Diagnosis not present

## 2020-12-12 DIAGNOSIS — E063 Autoimmune thyroiditis: Secondary | ICD-10-CM | POA: Diagnosis not present

## 2020-12-16 DIAGNOSIS — I480 Paroxysmal atrial fibrillation: Secondary | ICD-10-CM | POA: Diagnosis not present

## 2020-12-16 DIAGNOSIS — Z7901 Long term (current) use of anticoagulants: Secondary | ICD-10-CM | POA: Diagnosis not present

## 2020-12-16 DIAGNOSIS — Z8679 Personal history of other diseases of the circulatory system: Secondary | ICD-10-CM | POA: Diagnosis not present

## 2020-12-16 DIAGNOSIS — Z9889 Other specified postprocedural states: Secondary | ICD-10-CM | POA: Diagnosis not present

## 2020-12-16 DIAGNOSIS — I4819 Other persistent atrial fibrillation: Secondary | ICD-10-CM | POA: Diagnosis not present

## 2020-12-17 DIAGNOSIS — G4733 Obstructive sleep apnea (adult) (pediatric): Secondary | ICD-10-CM | POA: Diagnosis not present

## 2020-12-18 DIAGNOSIS — D6869 Other thrombophilia: Secondary | ICD-10-CM | POA: Diagnosis not present

## 2020-12-18 DIAGNOSIS — L719 Rosacea, unspecified: Secondary | ICD-10-CM | POA: Diagnosis not present

## 2020-12-18 DIAGNOSIS — E039 Hypothyroidism, unspecified: Secondary | ICD-10-CM | POA: Diagnosis not present

## 2020-12-18 DIAGNOSIS — Z0000 Encounter for general adult medical examination without abnormal findings: Secondary | ICD-10-CM | POA: Diagnosis not present

## 2020-12-18 DIAGNOSIS — I10 Essential (primary) hypertension: Secondary | ICD-10-CM | POA: Diagnosis not present

## 2020-12-18 DIAGNOSIS — R7303 Prediabetes: Secondary | ICD-10-CM | POA: Diagnosis not present

## 2020-12-18 DIAGNOSIS — E7800 Pure hypercholesterolemia, unspecified: Secondary | ICD-10-CM | POA: Diagnosis not present

## 2020-12-18 DIAGNOSIS — Z125 Encounter for screening for malignant neoplasm of prostate: Secondary | ICD-10-CM | POA: Diagnosis not present

## 2020-12-18 DIAGNOSIS — I480 Paroxysmal atrial fibrillation: Secondary | ICD-10-CM | POA: Diagnosis not present

## 2021-02-12 DIAGNOSIS — Z9889 Other specified postprocedural states: Secondary | ICD-10-CM | POA: Diagnosis not present

## 2021-02-12 DIAGNOSIS — H906 Mixed conductive and sensorineural hearing loss, bilateral: Secondary | ICD-10-CM | POA: Diagnosis not present

## 2021-02-12 DIAGNOSIS — H7411 Adhesive right middle ear disease: Secondary | ICD-10-CM | POA: Diagnosis not present

## 2021-02-12 DIAGNOSIS — H90A31 Mixed conductive and sensorineural hearing loss, unilateral, right ear with restricted hearing on the contralateral side: Secondary | ICD-10-CM | POA: Diagnosis not present

## 2021-02-15 ENCOUNTER — Other Ambulatory Visit: Payer: Self-pay | Admitting: Interventional Cardiology

## 2021-02-16 ENCOUNTER — Telehealth: Payer: Self-pay | Admitting: *Deleted

## 2021-02-16 MED ORDER — DOFETILIDE 500 MCG PO CAPS: ORAL_CAPSULE | ORAL | 2 refills | Status: DC

## 2021-02-16 NOTE — Telephone Encounter (Signed)
Left message to call back.   Patient dropped off EKG and monitor results for review. Per MD okay to continue Dofetilide at current dose. Sent in refills. Patient is overdue for follow up in Afib Clinic and appointment needs to be made.

## 2021-02-17 NOTE — Telephone Encounter (Signed)
Called patient with Tina's message.

## 2021-02-17 NOTE — Telephone Encounter (Signed)
Patient has appointment on 10/5 at 2 pm with A. FIB clinic.

## 2021-02-17 NOTE — Telephone Encounter (Signed)
Patient returning call.

## 2021-02-27 DIAGNOSIS — Z125 Encounter for screening for malignant neoplasm of prostate: Secondary | ICD-10-CM | POA: Diagnosis not present

## 2021-03-04 ENCOUNTER — Ambulatory Visit (HOSPITAL_COMMUNITY): Payer: Medicare PPO | Admitting: Nurse Practitioner

## 2021-03-06 ENCOUNTER — Telehealth: Payer: Self-pay | Admitting: Interventional Cardiology

## 2021-03-06 MED ORDER — RIVAROXABAN 20 MG PO TABS: 20.0000 mg | ORAL_TABLET | Freq: Every day | ORAL | 1 refills | Status: DC

## 2021-03-06 NOTE — Telephone Encounter (Signed)
Prescription refill request for Xarelto received.  Indication: afib  Last office visit: Allred, 10/17/2020 Weight: 74.8kg Age: 70 yo  Scr: 0.87, 10/08/2020 CrCl: 84 ml/min   Refill sent.

## 2021-03-06 NOTE — Telephone Encounter (Signed)
*  STAT* If patient is at the pharmacy, call can be transferred to refill team.   1. Which medications need to be refilled? (please list name of each medication and dose if known) XARELTO 20 MG TABS tablet  2. Which pharmacy/location (including street and city if local pharmacy) is medication to be sent to?XARELTO 20 MG TABS tablet  3. Do they need a 30 day or 90 day supply? 90 day supply

## 2021-03-09 ENCOUNTER — Encounter (INDEPENDENT_AMBULATORY_CARE_PROVIDER_SITE_OTHER): Payer: Medicare PPO | Admitting: Ophthalmology

## 2021-03-09 ENCOUNTER — Other Ambulatory Visit: Payer: Self-pay

## 2021-03-09 DIAGNOSIS — H35372 Puckering of macula, left eye: Secondary | ICD-10-CM | POA: Diagnosis not present

## 2021-03-09 DIAGNOSIS — H43813 Vitreous degeneration, bilateral: Secondary | ICD-10-CM

## 2021-03-09 DIAGNOSIS — H2513 Age-related nuclear cataract, bilateral: Secondary | ICD-10-CM | POA: Diagnosis not present

## 2021-03-09 DIAGNOSIS — H353132 Nonexudative age-related macular degeneration, bilateral, intermediate dry stage: Secondary | ICD-10-CM | POA: Diagnosis not present

## 2021-03-09 DIAGNOSIS — I10 Essential (primary) hypertension: Secondary | ICD-10-CM

## 2021-03-09 DIAGNOSIS — H33303 Unspecified retinal break, bilateral: Secondary | ICD-10-CM | POA: Diagnosis not present

## 2021-03-09 DIAGNOSIS — H35033 Hypertensive retinopathy, bilateral: Secondary | ICD-10-CM

## 2021-03-11 ENCOUNTER — Ambulatory Visit (HOSPITAL_COMMUNITY): Payer: Medicare PPO | Admitting: Nurse Practitioner

## 2021-03-12 DIAGNOSIS — Z23 Encounter for immunization: Secondary | ICD-10-CM | POA: Diagnosis not present

## 2021-03-25 DIAGNOSIS — Z23 Encounter for immunization: Secondary | ICD-10-CM | POA: Diagnosis not present

## 2021-04-15 ENCOUNTER — Ambulatory Visit (HOSPITAL_COMMUNITY): Payer: Medicare PPO | Admitting: Nurse Practitioner

## 2021-04-28 DIAGNOSIS — H5213 Myopia, bilateral: Secondary | ICD-10-CM | POA: Diagnosis not present

## 2021-04-28 DIAGNOSIS — E119 Type 2 diabetes mellitus without complications: Secondary | ICD-10-CM | POA: Diagnosis not present

## 2021-04-28 DIAGNOSIS — H1789 Other corneal scars and opacities: Secondary | ICD-10-CM | POA: Diagnosis not present

## 2021-04-28 DIAGNOSIS — H35372 Puckering of macula, left eye: Secondary | ICD-10-CM | POA: Diagnosis not present

## 2021-05-04 DIAGNOSIS — K5792 Diverticulitis of intestine, part unspecified, without perforation or abscess without bleeding: Secondary | ICD-10-CM | POA: Diagnosis not present

## 2021-05-19 ENCOUNTER — Other Ambulatory Visit: Payer: Self-pay | Admitting: Interventional Cardiology

## 2021-05-19 ENCOUNTER — Telehealth: Payer: Self-pay | Admitting: *Deleted

## 2021-05-19 NOTE — Telephone Encounter (Addendum)
Disregard previous note

## 2021-06-04 DIAGNOSIS — R040 Epistaxis: Secondary | ICD-10-CM | POA: Diagnosis not present

## 2021-06-04 DIAGNOSIS — R42 Dizziness and giddiness: Secondary | ICD-10-CM | POA: Diagnosis not present

## 2021-06-04 DIAGNOSIS — R5383 Other fatigue: Secondary | ICD-10-CM | POA: Diagnosis not present

## 2021-06-10 DIAGNOSIS — D6869 Other thrombophilia: Secondary | ICD-10-CM | POA: Diagnosis not present

## 2021-06-10 DIAGNOSIS — R1030 Lower abdominal pain, unspecified: Secondary | ICD-10-CM | POA: Diagnosis not present

## 2021-06-10 DIAGNOSIS — I10 Essential (primary) hypertension: Secondary | ICD-10-CM | POA: Diagnosis not present

## 2021-06-10 DIAGNOSIS — M2560 Stiffness of unspecified joint, not elsewhere classified: Secondary | ICD-10-CM | POA: Diagnosis not present

## 2021-06-10 DIAGNOSIS — R7303 Prediabetes: Secondary | ICD-10-CM | POA: Diagnosis not present

## 2021-06-10 DIAGNOSIS — I480 Paroxysmal atrial fibrillation: Secondary | ICD-10-CM | POA: Diagnosis not present

## 2021-07-21 NOTE — Progress Notes (Signed)
Cardiology Office Note:    Date:  07/22/2021   ID:  Anthony Cole, DOB 02/11/1951, MRN [id number]  PCP:  Lawerance Cruel, MD  Cardiologist:  Daneen Schick, MD   Referring MD: Lawerance Cruel, MD   Chief Complaint  Patient presents with   Sleep Apnea   Hypertension    History of Present Illness:    Anthony Cole is a 71 y.o. male with a hx of moderate OSA with an AHI of 19 events per hour.  He is on CPAP at 8cm H2O. he is doing well with his CPAP device and thinks that he has gotten used to it.  He tolerates the mask and feels the pressure is adequate.  Since going on CPAP he feels rested in the am and has no significant daytime sleepiness.  He denies any significant mouth or nasal dryness or nasal congestion.  He does not think that he snores.     Past Medical History:  Diagnosis Date   Arthritis    Atrial fibrillation ([place])    Diabetes mellitus without complication ([place])    type 2   Hyperlipidemia    Hypertension    Hypothyroidism    Obesity (BMI 30-39.9) 01/25/2016   OSA on CPAP 07/16/2014   Moderate with AHI 19/hr on CPAP at 8cm H2O   Pre-diabetes    Visit for monitoring Tikosyn therapy 10/25/2017    Past Surgical History:  Procedure Laterality Date   ATRIAL FIBRILLATION ABLATION N/A 08/25/2017   Procedure: ATRIAL FIBRILLATION ABLATION;  Surgeon: Thompson Grayer, MD;  Location: [place] [facility];  Service: Cardiovascular;  Laterality: N/A;   CARDIOVERSION  11/04/2011   Procedure: CARDIOVERSION;  Surgeon: Sinclair Grooms, MD;  Location: [place];  Service: Cardiovascular;  Laterality: N/A;   CARDIOVERSION N/A 07/02/2013   Procedure: CARDIOVERSION;  Surgeon: Sinclair Grooms, MD;  Location: [facility] ENDOSCOPY;  Service: Cardiovascular;  Laterality: N/A;  10:07 elective cardioversion Lido 40mg , Propofol 60mg ,IV...200 joules, synched, pt presently in A flutter,...cardioverted to NSR...   CARDIOVERSION N/A 09/19/2013   Procedure: CARDIOVERSION;  Surgeon: Sinclair Grooms,  MD;  Location: [facility] ENDOSCOPY;  Service: Cardiovascular;  Laterality: N/A;   CARDIOVERSION N/A 08/13/2014   Procedure: CARDIOVERSION;  Surgeon: Dorothy Spark, MD;  Location: [place];  Service: Cardiovascular;  Laterality: N/A;   CARDIOVERSION N/A 05/23/2017   Procedure: CARDIOVERSION;  Surgeon: Acie Fredrickson Wonda Cheng, MD;  Location: [facility] ENDOSCOPY;  Service: Cardiovascular;  Laterality: N/A;   CARDIOVERSION N/A 09/09/2017   Procedure: CARDIOVERSION;  Surgeon: Fay Records, MD;  Location: [facility] ENDOSCOPY;  Service: Cardiovascular;  Laterality: N/A;   CARDIOVERSION N/A 09/30/2017   Procedure: CARDIOVERSION;  Surgeon: Herminio Commons, MD;  Location: [place];  Service: Cardiovascular;  Laterality: N/A;   Blanket Left "when I was a kid"   FRACTURE SURGERY     KNEE ARTHROSCOPY W/ DEBRIDEMENT Bilateral    RETINAL LASER PROCEDURE Bilateral    "tears on both; not detachments"   SHOULDER ARTHROSCOPY W/ ROTATOR CUFF REPAIR Bilateral    STAPEDES SURGERY Right    TONSILLECTOMY     TYMPANOPLASTY Bilateral    "skin grafts to ear drums"    Current Medications: Current Meds  Medication Sig   acyclovir (ZOVIRAX) 200 MG capsule Take 200 mg by mouth 2 (two) times daily as needed (first sign of fever blister).    Cholecalciferol (VITAMIN D3 PO) Take 5,000 Units by mouth daily.  dofetilide (TIKOSYN) 500 MCG capsule TAKE (1) CAPSULE TWICE DAILY.   doxycycline (VIBRA-TABS) 100 MG tablet Take 100 mg by mouth in the morning and at bedtime.   levothyroxine (SYNTHROID, LEVOTHROID) 88 MCG tablet Take 88 mcg by mouth daily before breakfast.    losartan (COZAAR) 100 MG tablet TAKE 1 TABLET ONCE DAILY.   Multiple Vitamins-Minerals (PRESERVISION AREDS 2+MULTI VIT PO) Take 1 tablet by mouth in the morning and at bedtime.   rivaroxaban (XARELTO) 20 MG TABS tablet Take 1 tablet (20 mg total) by mouth daily with supper.   rosuvastatin (CRESTOR) 20 MG tablet TAKE 1 TABLET ONCE DAILY.    spironolactone (ALDACTONE) 25 MG tablet Take 1 tablet by mouth daily.   [DISCONTINUED] rivaroxaban (XARELTO) 20 MG TABS tablet Take 1 tablet by mouth daily.   Current Facility-Administered Medications for the 07/22/21 encounter (Office Visit) with Quintella Reichert, MD  Medication   0.9 %  sodium chloride infusion   sodium chloride flush (NS) 0.9 % injection 3 mL   sodium chloride flush (NS) 0.9 % injection 3 mL     Allergies:   Gabapentin, Other, Atorvastatin, and Tamsulosin hcl   Social History   Socioeconomic History   Marital status: Single    Spouse name: Not on file   Number of children: Not on file   Years of education: Not on file   Highest education level: Not on file  Occupational History   Not on file  Tobacco Use   Smoking status: Never   Smokeless tobacco: Never  Vaping Use   Vaping Use: Never used  Substance and Sexual Activity   Alcohol use: No   Drug use: No   Sexual activity: Not Currently  Other Topics Concern   Not on file  Social History Narrative   Not on file   Social Determinants of Health   Financial Resource Strain: Not on file  Food Insecurity: Not on file  Transportation Needs: Not on file  Physical Activity: Not on file  Stress: Not on file  Social Connections: Not on file     Family History: The patient's family history includes Hypertension in his mother; Stroke in his brother and maternal grandmother.  ROS:   Please see the history of present illness.    ROS  All other systems reviewed and negative.   EKGs/Labs/Other Studies Reviewed:    The following studies were reviewed today: PAP compliance download  EKG:  EKG is not ordered today.    Recent Labs: 10/08/2020: BUN 21; Creatinine, Ser 0.87; Hemoglobin 13.6; Magnesium 2.3; Platelets 238; Potassium 5.0; Sodium 138   Recent Lipid Panel    Component Value Date/Time   CHOL 131 08/10/2018 0926   TRIG 91 08/10/2018 0926   HDL 39 (L) 08/10/2018 0926   CHOLHDL 3.4 08/10/2018  0926   LDLCALC 74 08/10/2018 0926    Physical Exam:    VS:  BP 130/70    Pulse (!) 52    Ht 5\' 9"  (1.753 m)    Wt 177 lb (80.3 kg)    SpO2 99%    BMI 26.14 kg/m     Wt Readings from Last 3 Encounters:  07/22/21 177 lb (80.3 kg)  10/17/20 165 lb (74.8 kg)  10/14/20 165 lb (74.8 kg)     GEN: Well nourished, well developed in no acute distress HEENT: Normal NECK: No JVD; No carotid bruits LYMPHATICS: No lymphadenopathy CARDIAC:RRR, no murmurs, rubs, gallops RESPIRATORY:  Clear to auscultation without rales, wheezing or rhonchi  ABDOMEN: Soft, non-tender, non-distended MUSCULOSKELETAL:  No edema; No deformity  SKIN: Warm and dry NEUROLOGIC:  Alert and oriented x 3 PSYCHIATRIC:  Normal affect   ASSESSMENT:    1. OSA (obstructive sleep apnea)   2. Essential hypertension    PLAN:    In order of problems listed above:  1.  OSA - The patient is tolerating PAP therapy well without any problems. The PAP download performed by his DME was personally reviewed and interpreted by me today and showed an AHI of 6/hr on 10 cm H2O with 100% compliance in using more than 4 hours nightly.  The patient has been using and benefiting from PAP use and will continue to benefit from therapy.    2.  HTN -BP is controlled on exam today -Continue prescription drug management with losartan 100 mg daily, Toprol-XL 50 mg daily and spironolactone 25 mg daily with PRN refills -I have personally reviewed and interpreted outside labs performed by patient's PCP which showed SCr 1.01, K+ 5.2 and Hbg 13.9 on 06/04/2021   Medication Adjustments/Labs and Tests Ordered: Current medicines are reviewed at length with the patient today.  Concerns regarding medicines are outlined above.  No orders of the defined types were placed in this encounter.  No orders of the defined types were placed in this encounter.   Signed, Fransico Him, MD  07/22/2021 10:40 AM    [place]

## 2021-07-22 ENCOUNTER — Encounter: Payer: Self-pay | Admitting: Cardiology

## 2021-07-22 ENCOUNTER — Ambulatory Visit: Payer: Medicare PPO | Admitting: Cardiology

## 2021-07-22 ENCOUNTER — Telehealth: Payer: Self-pay | Admitting: *Deleted

## 2021-07-22 ENCOUNTER — Other Ambulatory Visit: Payer: Self-pay

## 2021-07-22 VITALS — BP 130/70 | HR 52 | Ht 69.0 in | Wt 177.0 lb

## 2021-07-22 DIAGNOSIS — I10 Essential (primary) hypertension: Secondary | ICD-10-CM | POA: Diagnosis not present

## 2021-07-22 DIAGNOSIS — G4733 Obstructive sleep apnea (adult) (pediatric): Secondary | ICD-10-CM

## 2021-07-22 NOTE — Patient Instructions (Signed)

## 2021-07-22 NOTE — Telephone Encounter (Signed)
Order placed to [employer] via community message 

## 2021-07-22 NOTE — Telephone Encounter (Signed)
-----   Message from Theresia Majors, RN sent at 07/22/2021 10:45 AM EST ----- Per Dr. Mayford Knife, please order patient new CPAP supplies.  Thanks!

## 2021-08-02 NOTE — Progress Notes (Signed)
Cardiology Office Note:    Date:  08/03/2021   ID:  KYRE OLSHANSKY, DOB 01/24/1951, MRN [id number]  PCP:  Lawerance Cruel, MD  Cardiologist:  Sinclair Grooms, MD   Referring MD: Lawerance Cruel, MD   Chief Complaint  Patient presents with   Coronary Artery Disease   Atrial Fibrillation    History of Present Illness:    Anthony Cole is a 71 y.o. male with a hx of obstructive sleep apnea on CPAP, hypertension, hyperlipidemia, CAD with coronary calcium score 250, diabetes mellitus II, paroxysmal atrial fibrillation s/p ablation 08/2017 & recurrent AFIB --> Tykosyn10/07/2017. Repeat ablation discussed but not felt to be successful, and Tikosyn despite high burden  AF/flutter.   Asymptomatic relative to daily activities.  He is excepted the 50% efficacy of dofetilide and keeping him in sinus rhythm.  He notices no difference 1 way or the other.  He has not had syncope.  No side effects to current medical therapy.  He is on dofetilide therapy.  We did have conversation about small risk of proarrhythmia on dofetilide.  Past Medical History:  Diagnosis Date   Arthritis    Atrial fibrillation ([place])    Diabetes mellitus without complication ([place])    type 2   Hyperlipidemia    Hypertension    Hypothyroidism    Obesity (BMI 30-39.9) 01/25/2016   OSA on CPAP 07/16/2014   Moderate with AHI 19/hr on CPAP at 8cm H2O   Pre-diabetes    Visit for monitoring Tikosyn therapy 10/25/2017    Past Surgical History:  Procedure Laterality Date   ATRIAL FIBRILLATION ABLATION N/A 08/25/2017   Procedure: ATRIAL FIBRILLATION ABLATION;  Surgeon: Thompson Grayer, MD;  Location: [place] [facility];  Service: Cardiovascular;  Laterality: N/A;   CARDIOVERSION  11/04/2011   Procedure: CARDIOVERSION;  Surgeon: Sinclair Grooms, MD;  Location: [place];  Service: Cardiovascular;  Laterality: N/A;   CARDIOVERSION N/A 07/02/2013   Procedure: CARDIOVERSION;  Surgeon: Sinclair Grooms, MD;  Location: [facility]  ENDOSCOPY;  Service: Cardiovascular;  Laterality: N/A;  10:07 elective cardioversion Lido 40mg , Propofol 60mg ,IV...200 joules, synched, pt presently in A flutter,...cardioverted to NSR...   CARDIOVERSION N/A 09/19/2013   Procedure: CARDIOVERSION;  Surgeon: Sinclair Grooms, MD;  Location: [facility] ENDOSCOPY;  Service: Cardiovascular;  Laterality: N/A;   CARDIOVERSION N/A 08/13/2014   Procedure: CARDIOVERSION;  Surgeon: Dorothy Spark, MD;  Location: [place];  Service: Cardiovascular;  Laterality: N/A;   CARDIOVERSION N/A 05/23/2017   Procedure: CARDIOVERSION;  Surgeon: Acie Fredrickson Wonda Cheng, MD;  Location: [facility] ENDOSCOPY;  Service: Cardiovascular;  Laterality: N/A;   CARDIOVERSION N/A 09/09/2017   Procedure: CARDIOVERSION;  Surgeon: Fay Records, MD;  Location: [facility] ENDOSCOPY;  Service: Cardiovascular;  Laterality: N/A;   CARDIOVERSION N/A 09/30/2017   Procedure: CARDIOVERSION;  Surgeon: Herminio Commons, MD;  Location: [place];  Service: Cardiovascular;  Laterality: N/A;   EYE SURGERY     FINGER FRACTURE SURGERY Left "when I was a kid"   FRACTURE SURGERY     KNEE ARTHROSCOPY W/ DEBRIDEMENT Bilateral    RETINAL LASER PROCEDURE Bilateral    "tears on both; not detachments"   SHOULDER ARTHROSCOPY W/ ROTATOR CUFF REPAIR Bilateral    STAPEDES SURGERY Right    TONSILLECTOMY     TYMPANOPLASTY Bilateral    "skin grafts to ear drums"    Current Medications: Current Meds  Medication Sig   acyclovir (ZOVIRAX) 200 MG capsule Take 200 mg by  mouth 2 (two) times daily as needed (first sign of fever blister).    Cholecalciferol (VITAMIN D3 PO) Take 5,000 Units by mouth daily.    dofetilide (TIKOSYN) 500 MCG capsule TAKE (1) CAPSULE TWICE DAILY.   doxycycline (VIBRA-TABS) 100 MG tablet Take 100 mg by mouth in the morning and at bedtime.   levothyroxine (SYNTHROID) 100 MCG tablet Take 1 tablet by mouth daily.   losartan (COZAAR) 100 MG tablet TAKE 1 TABLET ONCE DAILY.   Multiple Vitamins-Minerals  (PRESERVISION AREDS 2+MULTI VIT PO) Take 1 tablet by mouth in the morning and at bedtime.   rivaroxaban (XARELTO) 20 MG TABS tablet Take 1 tablet (20 mg total) by mouth daily with supper.   rosuvastatin (CRESTOR) 20 MG tablet TAKE 1 TABLET ONCE DAILY.   spironolactone (ALDACTONE) 25 MG tablet Take 1 tablet by mouth daily.   [DISCONTINUED] levothyroxine (SYNTHROID, LEVOTHROID) 88 MCG tablet Take 88 mcg by mouth daily before breakfast.    Current Facility-Administered Medications for the 08/03/21 encounter (Office Visit) with Belva Crome, MD  Medication   0.9 %  sodium chloride infusion   sodium chloride flush (NS) 0.9 % injection 3 mL   sodium chloride flush (NS) 0.9 % injection 3 mL     Allergies:   Gabapentin, Other, Atorvastatin, and Tamsulosin hcl   Social History   Socioeconomic History   Marital status: Single    Spouse name: Not on file   Number of children: Not on file   Years of education: Not on file   Highest education level: Not on file  Occupational History   Not on file  Tobacco Use   Smoking status: Never   Smokeless tobacco: Never  Vaping Use   Vaping Use: Never used  Substance and Sexual Activity   Alcohol use: No   Drug use: No   Sexual activity: Not Currently  Other Topics Concern   Not on file  Social History Narrative   Not on file   Social Determinants of Health   Financial Resource Strain: Not on file  Food Insecurity: Not on file  Transportation Needs: Not on file  Physical Activity: Not on file  Stress: Not on file  Social Connections: Not on file     Family History: The patient's family history includes Hypertension in his mother; Stroke in his brother and maternal grandmother.  ROS:   Please see the history of present illness.    Anxiety and predominantly is afraid to have additional ablative therapy.  All other systems reviewed and are negative.  EKGs/Labs/Other Studies Reviewed:    The following studies were reviewed today: 3-7  DAY MONITOR 03/04/2021: 55% AF burden 1.3% PVC burden Longest AF episode 3D,12 Hrs with fastest rate 133 bpm.  EKG:  EKG atypical atrial flutter at 260 bpm.  With 3-1 AV conduction.  Recent Labs: 10/08/2020: BUN 21; Creatinine, Ser 0.87; Hemoglobin 13.6; Magnesium 2.3; Platelets 238; Potassium 5.0; Sodium 138  Recent Lipid Panel    Component Value Date/Time   CHOL 131 08/10/2018 0926   TRIG 91 08/10/2018 0926   HDL 39 (L) 08/10/2018 0926   CHOLHDL 3.4 08/10/2018 0926   LDLCALC 74 08/10/2018 0926    Physical Exam:    VS:  BP (!) 154/90    Pulse 86    Ht 5\' 9"  (1.753 m)    Wt 174 lb 9.6 oz (79.2 kg)    SpO2 98%    BMI 25.78 kg/m     Wt Readings from Last  3 Encounters:  08/03/21 174 lb 9.6 oz (79.2 kg)  07/22/21 177 lb (80.3 kg)  10/17/20 165 lb (74.8 kg)     GEN: Healthy appearing. No acute distress HEENT: Normal NECK: No JVD. LYMPHATICS: No lymphadenopathy CARDIAC: No murmur. RRR no gallop, or edema. VASCULAR:  Normal Pulses. No bruits. RESPIRATORY:  Clear to auscultation without rales, wheezing or rhonchi  ABDOMEN: Soft, non-tender, non-distended, No pulsatile mass, MUSCULOSKELETAL: No deformity  SKIN: Warm and dry NEUROLOGIC:  Alert and oriented x 3 PSYCHIATRIC:  Normal affect   ASSESSMENT:    1. Paroxysmal atrial fibrillation (H[facility])   2. Essential hypertension   3. OSA (obstructive sleep apnea)   4. Acquired thrombophilia ([place])   5. Hypertension associated with diabetes ([place])   6. Aortic atherosclerosis ([place])   7. Hyperlipidemia associated with type 2 diabetes mellitus ([place])   8. Long term current use of anticoagulant therapy    PLAN:    In order of problems listed above:  In atrial flutter the day, atypical with flutter rate above 240 bpm.  On dofetilide.  Last quantification of A-fib fib/flutter burden was 55% atrial fibs/atrial flutter.  Both Dr. Rayann Heman and Dr. Omelia Blackwater suggested touchup ablation but the patient never followed up.  We will send him back to  Dr. Omelia Blackwater for further guidance concerning chronic dofetilide therapy with high burden of recurrent A-fib/a flutter. Low-salt diet recommended blood pressure is a little elevated today.  Target is less than 140/80 mmHg and ideally in the 130/80 range. Encourage compliance with CPAP. Continue Xarelto therapy. He is on Aldactone and losartan.  Last potassium 2-1/2 months ago was 5.2.  Will recheck basic metabolic panel and magnesium today. Continue aggressive lipid-lowering Continue Crestor 20 mg/day Continue Xarelto.   We will arrange for him to follow-up with Dr. Omelia Blackwater.   Medication Adjustments/Labs and Tests Ordered: Current medicines are reviewed at length with the patient today.  Concerns regarding medicines are outlined above.  Orders Placed This Encounter  Procedures   Magnesium   Basic metabolic panel   Ambulatory referral to Cardiac Electrophysiology   EKG 12-Lead   No orders of the defined types were placed in this encounter.   Patient Instructions  Medication Instructions:  Your physician recommends that you continue on your current medications as directed. Please refer to the Current Medication list given to you today.  *If you need a refill on your cardiac medications before your next appointment, please call your pharmacy*   Lab Work: BMET and Magnesium today  If you have labs (blood work) drawn today and your tests are completely normal, you will receive your results only by: [place] (if you have MyChart) OR A paper copy in the mail If you have any lab test that is abnormal or we need to change your treatment, we will call you to review the results.   Testing/Procedures: None   Follow-Up: At [facility], you and your health needs are our priority.  As part of our continuing mission to provide you with exceptional heart care, we have created designated Provider Care Teams.  These Care Teams include your primary Cardiologist (physician) and  Advanced Practice Providers (APPs -  Physician Assistants and Nurse Practitioners) who all work together to provide you with the care you need, when you need it.  We recommend signing up for the patient portal called "MyChart".  Sign up information is provided on this After Visit Summary.  MyChart is used to connect with patients for Virtual Visits (Telemedicine).  Patients are able to view lab/test results, encounter notes, upcoming appointments, etc.  Non-urgent messages can be sent to your provider as well.   To learn more about what you can do with MyChart, go to [email or web address].    Your next appointment:   9-12 month(s)  The format for your next appointment:   In Person  Provider:   Sinclair Grooms, MD     Other Instructions     Signed, Sinclair Grooms, MD  08/03/2021 2:05 PM    [place]

## 2021-08-03 ENCOUNTER — Encounter: Payer: Self-pay | Admitting: Interventional Cardiology

## 2021-08-03 ENCOUNTER — Ambulatory Visit: Payer: Medicare PPO | Admitting: Interventional Cardiology

## 2021-08-03 ENCOUNTER — Other Ambulatory Visit: Payer: Self-pay

## 2021-08-03 VITALS — BP 154/90 | HR 86 | Ht 69.0 in | Wt 174.6 lb

## 2021-08-03 DIAGNOSIS — E785 Hyperlipidemia, unspecified: Secondary | ICD-10-CM

## 2021-08-03 DIAGNOSIS — Z7901 Long term (current) use of anticoagulants: Secondary | ICD-10-CM

## 2021-08-03 DIAGNOSIS — G4733 Obstructive sleep apnea (adult) (pediatric): Secondary | ICD-10-CM

## 2021-08-03 DIAGNOSIS — I480 Paroxysmal atrial fibrillation: Secondary | ICD-10-CM

## 2021-08-03 DIAGNOSIS — I152 Hypertension secondary to endocrine disorders: Secondary | ICD-10-CM

## 2021-08-03 DIAGNOSIS — E1169 Type 2 diabetes mellitus with other specified complication: Secondary | ICD-10-CM | POA: Diagnosis not present

## 2021-08-03 DIAGNOSIS — E1159 Type 2 diabetes mellitus with other circulatory complications: Secondary | ICD-10-CM | POA: Diagnosis not present

## 2021-08-03 DIAGNOSIS — I700 Atherosclerosis of aorta: Secondary | ICD-10-CM | POA: Diagnosis not present

## 2021-08-03 DIAGNOSIS — D6869 Other thrombophilia: Secondary | ICD-10-CM

## 2021-08-03 DIAGNOSIS — I10 Essential (primary) hypertension: Secondary | ICD-10-CM

## 2021-08-03 NOTE — Patient Instructions (Signed)
Medication Instructions:  Your physician recommends that you continue on your current medications as directed. Please refer to the Current Medication list given to you today.  *If you need a refill on your cardiac medications before your next appointment, please call your pharmacy*   Lab Work: BMET and Magnesium today  If you have labs (blood work) drawn today and your tests are completely normal, you will receive your results only by: [place] (if you have MyChart) OR A paper copy in the mail If you have any lab test that is abnormal or we need to change your treatment, we will call you to review the results.   Testing/Procedures: None   Follow-Up: At [facility], you and your health needs are our priority.  As part of our continuing mission to provide you with exceptional heart care, we have created designated Provider Care Teams.  These Care Teams include your primary Cardiologist (physician) and Advanced Practice Providers (APPs -  Physician Assistants and Nurse Practitioners) who all work together to provide you with the care you need, when you need it.  We recommend signing up for the patient portal called "MyChart".  Sign up information is provided on this After Visit Summary.  MyChart is used to connect with patients for Virtual Visits (Telemedicine).  Patients are able to view lab/test results, encounter notes, upcoming appointments, etc.  Non-urgent messages can be sent to your provider as well.   To learn more about what you can do with MyChart, go to [email or web address].    Your next appointment:   9-12 month(s)  The format for your next appointment:   In Person  Provider:   Sinclair Grooms, MD     Other Instructions

## 2021-08-04 LAB — BASIC METABOLIC PANEL
BUN/Creatinine Ratio: 28 — ABNORMAL HIGH (ref 10–24)
BUN: 28 mg/dL — ABNORMAL HIGH (ref 8–27)
CO2: 26 mmol/L (ref 20–29)
Calcium: 9.6 mg/dL (ref 8.6–10.2)
Chloride: 102 mmol/L (ref 96–106)
Creatinine, Ser: 1.01 mg/dL (ref 0.76–1.27)
Glucose: 107 mg/dL — ABNORMAL HIGH (ref 70–99)
Potassium: 4.5 mmol/L (ref 3.5–5.2)
Sodium: 141 mmol/L (ref 134–144)
eGFR: 81 mL/min/{1.73_m2} (ref >59)

## 2021-08-04 LAB — MAGNESIUM: Magnesium: 2.2 mg/dL (ref 1.6–2.3)

## 2021-08-24 ENCOUNTER — Other Ambulatory Visit: Payer: Self-pay | Admitting: Interventional Cardiology

## 2021-09-18 ENCOUNTER — Other Ambulatory Visit: Payer: Self-pay | Admitting: Interventional Cardiology

## 2021-09-18 NOTE — Telephone Encounter (Signed)
Prescription refill request for Xarelto received.  ?Indication: afib  ?Last office visit: Katrinka Blazing 08/03/2021 ?Weight: 79.2 kg  ?Age: 71 yo  ?Scr: 1.01, 08/03/2021 ?CrCl: 26ml/min  ? ?Refill sent.  ?

## 2021-10-02 ENCOUNTER — Telehealth: Payer: Self-pay | Admitting: Interventional Cardiology

## 2021-10-02 NOTE — Telephone Encounter (Signed)
Spoke with patient who states he received a message from [place] yesterday about an appointment. No note in Epic regarding outgoing call. Will follow-up with Omar Person. ? ?Patient also asked about follow-up EKG and lab work while on [employer]. Last EKG and labs obtained 08/03/21. Per Dr. Katrinka Blazing we will have patient come in for nurse visit at the end of May to have follow-up EKG and BMP, Magnesium drawn. ? ?Patient also states he will need a refill on his Tikosyn and estimates he has about 6 weeks left of his current prescription. Will forward to Dr. Jenel Lucks nurse for refill. ?

## 2021-10-02 NOTE — Telephone Encounter (Signed)
Pt returning phone call from yesterday. Please advise ?

## 2021-10-15 ENCOUNTER — Ambulatory Visit (HOSPITAL_BASED_OUTPATIENT_CLINIC_OR_DEPARTMENT_OTHER): Payer: Medicare PPO | Admitting: Internal Medicine

## 2021-10-15 ENCOUNTER — Encounter (HOSPITAL_BASED_OUTPATIENT_CLINIC_OR_DEPARTMENT_OTHER): Payer: Self-pay | Admitting: Internal Medicine

## 2021-10-15 VITALS — BP 124/70 | HR 59 | Ht 69.0 in | Wt 180.0 lb

## 2021-10-15 DIAGNOSIS — G4733 Obstructive sleep apnea (adult) (pediatric): Secondary | ICD-10-CM | POA: Diagnosis not present

## 2021-10-15 DIAGNOSIS — I4819 Other persistent atrial fibrillation: Secondary | ICD-10-CM | POA: Diagnosis not present

## 2021-10-15 MED ORDER — DOFETILIDE 500 MCG PO CAPS
ORAL_CAPSULE | ORAL | 3 refills | Status: DC
  Filled 2022-04-14 – 2022-04-29 (×3): qty 180, 90d supply, fill #0
  Filled 2022-07-20: qty 180, 90d supply, fill #1

## 2021-10-15 NOTE — Progress Notes (Signed)
? ? ?PCP: Lawerance Cruel, MD ?Primary Cardiologist: Dr Tamala Julian ?Primary EP: Dr Zaida Reiland/ Omelia Blackwater ? ?Anthony Cole is a 71 y.o. male who presents today for routine electrophysiology followup.  Since last being seen in our clinic, the patient reports doing very well.  He is reasonably active.  He has some episodes of afib with fatigue.  He remains quite anxious.  He has seen Dr Omelia Blackwater for second opinion and ablation has been offered.  He has not yet decided to proceed.  Today, he denies symptoms of palpitations, chest pain, shortness of breath,  lower extremity edema, dizziness, presyncope, or syncope.  The patient is otherwise without complaint today.  ? ?Past Medical History:  ?Diagnosis Date  ? Arthritis   ? Atrial fibrillation ([place])   ? Diabetes mellitus without complication ([place])   ? type 2  ? Hyperlipidemia   ? Hypertension   ? Hypothyroidism   ? Obesity (BMI 30-39.9) 01/25/2016  ? OSA on CPAP 07/16/2014  ? Moderate with AHI 19/hr on CPAP at 8cm H2O  ? Pre-diabetes   ? Visit for monitoring Tikosyn therapy 10/25/2017  ? ?Past Surgical History:  ?Procedure Laterality Date  ? ATRIAL FIBRILLATION ABLATION N/A 08/25/2017  ? Procedure: ATRIAL FIBRILLATION ABLATION;  Surgeon: Thompson Grayer, MD;  Location: [facility];  Service: Cardiovascular;  Laterality: N/A;  ? CARDIOVERSION  11/04/2011  ? Procedure: CARDIOVERSION;  Surgeon: Sinclair Grooms, MD;  Location: [place];  Service: Cardiovascular;  Laterality: N/A;  ? CARDIOVERSION N/A 07/02/2013  ? Procedure: CARDIOVERSION;  Surgeon: Sinclair Grooms, MD;  Location: [facility] ENDOSCOPY;  Service: Cardiovascular;  Laterality: N/A;  10:07 elective cardioversion Lido 40mg , Propofol 60mg ,IV...200 joules, synched, pt presently in A flutter,...cardioverted to NSR...  ? CARDIOVERSION N/A 09/19/2013  ? Procedure: CARDIOVERSION;  Surgeon: Sinclair Grooms, MD;  Location: [facility] ENDOSCOPY;  Service: Cardiovascular;  Laterality: N/A;  ? CARDIOVERSION N/A 08/13/2014  ? Procedure:  CARDIOVERSION;  Surgeon: Dorothy Spark, MD;  Location: [place];  Service: Cardiovascular;  Laterality: N/A;  ? CARDIOVERSION N/A 05/23/2017  ? Procedure: CARDIOVERSION;  Surgeon: Thayer Headings, MD;  Location: [place];  Service: Cardiovascular;  Laterality: N/A;  ? CARDIOVERSION N/A 09/09/2017  ? Procedure: CARDIOVERSION;  Surgeon: Fay Records, MD;  Location: [place];  Service: Cardiovascular;  Laterality: N/A;  ? CARDIOVERSION N/A 09/30/2017  ? Procedure: CARDIOVERSION;  Surgeon: Herminio Commons, MD;  Location: [facility] ENDOSCOPY;  Service: Cardiovascular;  Laterality: N/A;  ? EYE SURGERY    ? FINGER FRACTURE SURGERY Left "when I was a kid"  ? FRACTURE SURGERY    ? KNEE ARTHROSCOPY W/ DEBRIDEMENT Bilateral   ? RETINAL LASER PROCEDURE Bilateral   ? "tears on both; not detachments"  ? SHOULDER ARTHROSCOPY W/ ROTATOR CUFF REPAIR Bilateral   ? STAPEDES SURGERY Right   ? TONSILLECTOMY    ? TYMPANOPLASTY Bilateral   ? "skin grafts to ear drums"  ? ? ?ROS- all systems are reviewed and negatives except as per HPI above ? ?Current Outpatient Medications  ?Medication Sig Dispense Refill  ? acyclovir (ZOVIRAX) 200 MG capsule Take 200 mg by mouth 2 (two) times daily as needed (first sign of fever blister).     ? Cholecalciferol (VITAMIN D3 PO) Take 5,000 Units by mouth daily.     ? dofetilide (TIKOSYN) 500 MCG capsule TAKE (1) CAPSULE TWICE DAILY. 180 capsule 2  ? doxycycline (VIBRA-TABS) 100 MG tablet Take 100 mg by mouth in the morning  and at bedtime.    ? doxycycline (VIBRA-TABS) 100 MG tablet     ? levothyroxine (SYNTHROID) 100 MCG tablet Take 1 tablet by mouth daily.    ? losartan (COZAAR) 100 MG tablet Take 1 tablet (100 mg total) by mouth daily. 90 tablet 3  ? Multiple Vitamins-Minerals (PRESERVISION AREDS 2+MULTI VIT PO) Take 1 tablet by mouth in the morning and at bedtime.    ? rosuvastatin (CRESTOR) 20 MG tablet TAKE 1 TABLET ONCE DAILY. 90 tablet 3  ? spironolactone (ALDACTONE) 25 MG tablet Take 1  tablet by mouth daily.    ? XARELTO 20 MG TABS tablet Take 1 tablet (20 mg total) by mouth daily with supper. 90 tablet 1  ? metoprolol succinate (TOPROL-XL) 50 MG 24 hr tablet Take 1 tablet (50 mg total) by mouth daily. Take with or immediately following a meal. 90 tablet 3  ? ?Current Facility-Administered Medications  ?Medication Dose Route Frequency Provider Last Rate Last Admin  ? 0.9 %  sodium chloride infusion  250 mL Intravenous Continuous Belva Crome, MD      ? sodium chloride flush (NS) 0.9 % injection 3 mL  3 mL Intravenous Q12H Belva Crome, MD      ? sodium chloride flush (NS) 0.9 % injection 3 mL  3 mL Intravenous PRN Belva Crome, MD      ? ? ?Physical Exam: ?Vitals:  ? 10/15/21 1041  ?BP: 124/70  ?Pulse: (!) 59  ?Weight: 180 lb (81.6 kg)  ?Height: 5\' 9"  (1.753 m)  ? ? ?GEN- The patient is anxious appearing, alert and oriented x 3 today.   ?Head- normocephalic, atraumatic ?Eyes-  Sclera clear, conjunctiva pink ?Ears- hearing intact ?Oropharynx- clear ?Lungs- Clear to ausculation bilaterally, normal work of breathing ?Heart- Regular rate and rhythm, no murmurs, rubs or gallops, PMI not laterally displaced ?GI- soft, NT, ND, + BS ?Extremities- no clubbing, cyanosis, or edema ? ?Wt Readings from Last 3 Encounters:  ?10/15/21 180 lb (81.6 kg)  ?08/03/21 174 lb 9.6 oz (79.2 kg)  ?07/22/21 177 lb (80.3 kg)  ? ? ?EKG tracing ordered today is personally reviewed and shows sinus rhythm 59 bpm, PVCs (rare), PR 208 msec , Qtc 437 msec ? ?Assessment and Plan: ? ?Persistent afib ?Doing well s/p prior ablation despite biatrial enlargement (Severe) ?Chads2vasc score is at leaset 3.  He is on xarelto. ?He remains on tikosyn ?Bmet, mg and cbc today ?He follows with [facility] also with Dr Omelia Blackwater.  I really do not think that he needs to see [place] both.  I have advised that in my transition that he continue to follow with Dr Omelia Blackwater.  We can also follow through the AF clinic.  I have discussed ablation at length today.   I agree with Dr Beckey Downing last note (personally reviewed) that this is the best option.  The patient is very fearful of ablation.  He wishes to think about this further.  He says that he has an appointment with Dr Omelia Blackwater in a couple weeks. ? ?2. HTN ?Stable ?No change required today ? ?3. OSA ?Stable ?No change required today ?Follows with Dr Radford Pax ? ?4. Pulmonary nodules ?Followed with Dr Harrington Challenger ?CT 7/21 reviewed and stable ? ? ?Risks, benefits and potential toxicities for medications prescribed and/or refilled reviewed with patient today.  ? ?AF clinic in 6 months and with Dr Omelia Blackwater as scheduled ? ?Thompson Grayer MD, FACC ?10/15/2021 ?10:55 AM ? ? ? ? ?

## 2021-10-15 NOTE — Patient Instructions (Addendum)
Medication Instructions:  ?Your physician recommends that you continue on your current medications as directed. Please refer to the Current Medication list given to you today. ?*If you need a refill on your cardiac medications before your next appointment, please call your pharmacy* ? ?Lab Work: ?You will get lab work today:  CBC, BMP and magnesium ? ?If you have labs (blood work) drawn today and your tests are completely normal, you will receive your results only by: ?MyChart Message (if you have MyChart) OR ?A paper copy in the mail ?If you have any lab test that is abnormal or we need to change your treatment, we will call you to review the results. ? ?Testing/Procedures: ?None ordered. ? ?Follow-Up: ?At [facility], you and your health needs are our priority.  As part of our continuing mission to provide you with exceptional heart care, we have created designated Provider Care Teams.  These Care Teams include your primary Cardiologist (physician) and Advanced Practice Providers (APPs -  Physician Assistants and Nurse Practitioners) who all work together to provide you with the care you need, when you need it. ? ?Your next appointment:   ?Your physician wants you to follow-up in: 6 months with the afib clinic. ? ?You will receive a reminder letter in the mail two months in advance. If you don't receive a letter, please call our office to schedule the follow-up appointment. ? ? ?Important Information About Sugar ? ? ? ? ? ? ? ? ?

## 2021-10-16 LAB — MAGNESIUM: Magnesium: 2.3 mg/dL (ref 1.6–2.3)

## 2021-10-16 LAB — BASIC METABOLIC PANEL
BUN/Creatinine Ratio: 26 — ABNORMAL HIGH (ref 10–24)
BUN: 28 mg/dL — ABNORMAL HIGH (ref 8–27)
CO2: 25 mmol/L (ref 20–29)
Calcium: 9.4 mg/dL (ref 8.6–10.2)
Chloride: 102 mmol/L (ref 96–106)
Creatinine, Ser: 1.06 mg/dL (ref 0.76–1.27)
Glucose: 125 mg/dL — ABNORMAL HIGH (ref 70–99)
Potassium: 5 mmol/L (ref 3.5–5.2)
Sodium: 139 mmol/L (ref 134–144)
eGFR: 76 mL/min/{1.73_m2} (ref >59)

## 2021-10-16 LAB — CBC
Hematocrit: 40.5 % (ref 37.5–51.0)
Hemoglobin: 13.6 g/dL (ref 13.0–17.7)
MCH: 31.3 pg (ref 26.6–33.0)
MCHC: 33.6 g/dL (ref 31.5–35.7)
MCV: 93 fL (ref 79–97)
Platelets: 213 10*3/uL (ref 150–450)
RBC: 4.35 x10E6/uL (ref 4.14–5.80)
RDW: 13 % (ref 11.6–15.4)
WBC: 7.6 10*3/uL (ref 3.4–10.8)

## 2021-10-19 DIAGNOSIS — R5383 Other fatigue: Secondary | ICD-10-CM | POA: Diagnosis not present

## 2021-10-19 DIAGNOSIS — N50819 Testicular pain, unspecified: Secondary | ICD-10-CM | POA: Diagnosis not present

## 2021-10-19 DIAGNOSIS — E039 Hypothyroidism, unspecified: Secondary | ICD-10-CM | POA: Diagnosis not present

## 2021-10-24 ENCOUNTER — Other Ambulatory Visit: Payer: Self-pay | Admitting: Interventional Cardiology

## 2021-10-26 DIAGNOSIS — Z79899 Other long term (current) drug therapy: Secondary | ICD-10-CM | POA: Diagnosis not present

## 2021-10-26 DIAGNOSIS — Z5181 Encounter for therapeutic drug level monitoring: Secondary | ICD-10-CM | POA: Diagnosis not present

## 2021-10-26 DIAGNOSIS — Z7901 Long term (current) use of anticoagulants: Secondary | ICD-10-CM | POA: Diagnosis not present

## 2021-10-26 DIAGNOSIS — I4819 Other persistent atrial fibrillation: Secondary | ICD-10-CM | POA: Diagnosis not present

## 2021-11-12 DIAGNOSIS — Z9889 Other specified postprocedural states: Secondary | ICD-10-CM | POA: Diagnosis not present

## 2021-11-12 DIAGNOSIS — H90A31 Mixed conductive and sensorineural hearing loss, unilateral, right ear with restricted hearing on the contralateral side: Secondary | ICD-10-CM | POA: Diagnosis not present

## 2021-11-12 DIAGNOSIS — H906 Mixed conductive and sensorineural hearing loss, bilateral: Secondary | ICD-10-CM | POA: Diagnosis not present

## 2021-11-12 DIAGNOSIS — H93A2 Pulsatile tinnitus, left ear: Secondary | ICD-10-CM | POA: Diagnosis not present

## 2021-11-12 DIAGNOSIS — H7411 Adhesive right middle ear disease: Secondary | ICD-10-CM | POA: Diagnosis not present

## 2021-11-12 DIAGNOSIS — Z888 Allergy status to other drugs, medicaments and biological substances status: Secondary | ICD-10-CM | POA: Diagnosis not present

## 2021-11-12 DIAGNOSIS — H6983 Other specified disorders of Eustachian tube, bilateral: Secondary | ICD-10-CM | POA: Diagnosis not present

## 2021-11-26 DIAGNOSIS — H7411 Adhesive right middle ear disease: Secondary | ICD-10-CM | POA: Diagnosis not present

## 2021-11-26 DIAGNOSIS — H906 Mixed conductive and sensorineural hearing loss, bilateral: Secondary | ICD-10-CM | POA: Diagnosis not present

## 2021-11-26 DIAGNOSIS — H93A2 Pulsatile tinnitus, left ear: Secondary | ICD-10-CM | POA: Diagnosis not present

## 2021-11-26 DIAGNOSIS — H6983 Other specified disorders of Eustachian tube, bilateral: Secondary | ICD-10-CM | POA: Diagnosis not present

## 2021-11-26 DIAGNOSIS — I10 Essential (primary) hypertension: Secondary | ICD-10-CM | POA: Diagnosis not present

## 2021-11-26 DIAGNOSIS — Z9889 Other specified postprocedural states: Secondary | ICD-10-CM | POA: Diagnosis not present

## 2021-12-21 DIAGNOSIS — H9193 Unspecified hearing loss, bilateral: Secondary | ICD-10-CM | POA: Diagnosis not present

## 2021-12-21 DIAGNOSIS — H93A3 Pulsatile tinnitus, bilateral: Secondary | ICD-10-CM | POA: Diagnosis not present

## 2021-12-28 DIAGNOSIS — Z125 Encounter for screening for malignant neoplasm of prostate: Secondary | ICD-10-CM | POA: Diagnosis not present

## 2021-12-28 DIAGNOSIS — I10 Essential (primary) hypertension: Secondary | ICD-10-CM | POA: Diagnosis not present

## 2021-12-28 DIAGNOSIS — R7303 Prediabetes: Secondary | ICD-10-CM | POA: Diagnosis not present

## 2021-12-28 DIAGNOSIS — E7800 Pure hypercholesterolemia, unspecified: Secondary | ICD-10-CM | POA: Diagnosis not present

## 2021-12-28 DIAGNOSIS — E039 Hypothyroidism, unspecified: Secondary | ICD-10-CM | POA: Diagnosis not present

## 2022-01-01 DIAGNOSIS — L719 Rosacea, unspecified: Secondary | ICD-10-CM | POA: Diagnosis not present

## 2022-01-01 DIAGNOSIS — I10 Essential (primary) hypertension: Secondary | ICD-10-CM | POA: Diagnosis not present

## 2022-01-01 DIAGNOSIS — E7800 Pure hypercholesterolemia, unspecified: Secondary | ICD-10-CM | POA: Diagnosis not present

## 2022-01-01 DIAGNOSIS — Z0000 Encounter for general adult medical examination without abnormal findings: Secondary | ICD-10-CM | POA: Diagnosis not present

## 2022-01-01 DIAGNOSIS — E039 Hypothyroidism, unspecified: Secondary | ICD-10-CM | POA: Diagnosis not present

## 2022-01-01 DIAGNOSIS — Z125 Encounter for screening for malignant neoplasm of prostate: Secondary | ICD-10-CM | POA: Diagnosis not present

## 2022-01-01 DIAGNOSIS — E11319 Type 2 diabetes mellitus with unspecified diabetic retinopathy without macular edema: Secondary | ICD-10-CM | POA: Diagnosis not present

## 2022-01-04 DIAGNOSIS — I480 Paroxysmal atrial fibrillation: Secondary | ICD-10-CM | POA: Diagnosis not present

## 2022-01-04 DIAGNOSIS — I679 Cerebrovascular disease, unspecified: Secondary | ICD-10-CM | POA: Diagnosis not present

## 2022-01-04 DIAGNOSIS — E11319 Type 2 diabetes mellitus with unspecified diabetic retinopathy without macular edema: Secondary | ICD-10-CM | POA: Diagnosis not present

## 2022-01-04 DIAGNOSIS — D6869 Other thrombophilia: Secondary | ICD-10-CM | POA: Diagnosis not present

## 2022-01-04 DIAGNOSIS — R3989 Other symptoms and signs involving the genitourinary system: Secondary | ICD-10-CM | POA: Diagnosis not present

## 2022-01-04 DIAGNOSIS — E7800 Pure hypercholesterolemia, unspecified: Secondary | ICD-10-CM | POA: Diagnosis not present

## 2022-01-05 ENCOUNTER — Encounter: Payer: Self-pay | Admitting: Neurology

## 2022-01-14 DIAGNOSIS — E039 Hypothyroidism, unspecified: Secondary | ICD-10-CM | POA: Diagnosis not present

## 2022-01-14 DIAGNOSIS — E7800 Pure hypercholesterolemia, unspecified: Secondary | ICD-10-CM | POA: Diagnosis not present

## 2022-01-14 DIAGNOSIS — K5792 Diverticulitis of intestine, part unspecified, without perforation or abscess without bleeding: Secondary | ICD-10-CM | POA: Diagnosis not present

## 2022-01-14 DIAGNOSIS — I10 Essential (primary) hypertension: Secondary | ICD-10-CM | POA: Diagnosis not present

## 2022-01-14 DIAGNOSIS — E113293 Type 2 diabetes mellitus with mild nonproliferative diabetic retinopathy without macular edema, bilateral: Secondary | ICD-10-CM | POA: Diagnosis not present

## 2022-01-27 DIAGNOSIS — E039 Hypothyroidism, unspecified: Secondary | ICD-10-CM | POA: Diagnosis not present

## 2022-01-27 DIAGNOSIS — I10 Essential (primary) hypertension: Secondary | ICD-10-CM | POA: Diagnosis not present

## 2022-01-27 DIAGNOSIS — K5792 Diverticulitis of intestine, part unspecified, without perforation or abscess without bleeding: Secondary | ICD-10-CM | POA: Diagnosis not present

## 2022-01-27 DIAGNOSIS — E113293 Type 2 diabetes mellitus with mild nonproliferative diabetic retinopathy without macular edema, bilateral: Secondary | ICD-10-CM | POA: Diagnosis not present

## 2022-01-27 DIAGNOSIS — E7800 Pure hypercholesterolemia, unspecified: Secondary | ICD-10-CM | POA: Diagnosis not present

## 2022-02-10 DIAGNOSIS — E7800 Pure hypercholesterolemia, unspecified: Secondary | ICD-10-CM | POA: Diagnosis not present

## 2022-02-10 DIAGNOSIS — E113293 Type 2 diabetes mellitus with mild nonproliferative diabetic retinopathy without macular edema, bilateral: Secondary | ICD-10-CM | POA: Diagnosis not present

## 2022-02-10 DIAGNOSIS — I10 Essential (primary) hypertension: Secondary | ICD-10-CM | POA: Diagnosis not present

## 2022-02-11 NOTE — Progress Notes (Unsigned)
NEUROLOGY CONSULTATION NOTE  Anthony Cole MRN: [id number] DOB: 1951/04/04  Referring provider: Vianne Bulls, MD Primary care provider: Vianne Bulls, MD  Reason for consult:  cerebrovascular disease  Assessment/Plan:   Left subcortical frontal lobe infarct - incidental finding.  Asymptomatic.  Likely embolic in setting of atrial fibrillation Paroxysmal atrial fibrillation Hypertension (microhemorrhages likely related to HTN) Type 2 diabetes mellitus Hyperlipidemia   Continue current management: Xarelto Statin. LDL goal less than 70 Glycemic control.  Hgb A1c goal less than 7 Normotensive blood pressure Check CTA of head to evaluate for possibility of intracranial stenosis Further recommendations pending results.     Subjective:  Anthony Cole is a 71 year old male with HTN, HLD, OSA, DM II and atrial fibrillation who presents for cerebrovascular disease.  History supplemented by referring provider's note.  MRI brain personally reviewed.  He had been experiencing pulsatile tinnitus in the left ear.  He was evaluated by ENT.  Carotid ultrasound on 11/26/2021 showed no hemodynamically significant stenosis.  He had an MRI of brain and IACs with and without contrast on 12/21/2021 which revealed remote small infarct in the left frontal subcortical white matter with possible surrounding recent infarcts as well as other chronic lacunar infarcts and few scattered remote microhemorrhages.  The pulsatile tinnitus actually resolved after 4 to 6 weeks.  He is currently on Xarelto for paroxysmal atrial fibrillation.  Recent labs include Hgb A1c 6.9 and LDL 85.  Statin was increased by PCP.   PAST MEDICAL HISTORY: Past Medical History:  Diagnosis Date   Arthritis    Atrial fibrillation (H[facility])    Diabetes mellitus without complication (H[facility])    type 2   Hyperlipidemia    Hypertension    Hypothyroidism    Obesity (BMI 30-39.9) 01/25/2016   OSA on CPAP 07/16/2014   Moderate with AHI  19/hr on CPAP at 8cm H2O   Pre-diabetes    Visit for monitoring Tikosyn therapy 10/25/2017    PAST SURGICAL HISTORY: Past Surgical History:  Procedure Laterality Date   ATRIAL FIBRILLATION ABLATION N/A 08/25/2017   Procedure: ATRIAL FIBRILLATION ABLATION;  Surgeon: Hillis Range, MD;  Location: [facility];  Service: Cardiovascular;  Laterality: N/A;   CARDIOVERSION  11/04/2011   Procedure: CARDIOVERSION;  Surgeon: Lesleigh Noe, MD;  Location: [facility] OR;  Service: Cardiovascular;  Laterality: N/A;   CARDIOVERSION N/A 07/02/2013   Procedure: CARDIOVERSION;  Surgeon: Lesleigh Noe, MD;  Location: [facility] ENDOSCOPY;  Service: Cardiovascular;  Laterality: N/A;  10:07 elective cardioversion Lido 40mg , Propofol 60mg ,IV...200 joules, synched, pt presently in A flutter,...cardioverted to NSR...   CARDIOVERSION N/A 09/19/2013   Procedure: CARDIOVERSION;  Surgeon: Lesleigh Noe, MD;  Location: [facility] ENDOSCOPY;  Service: Cardiovascular;  Laterality: N/A;   CARDIOVERSION N/A 08/13/2014   Procedure: CARDIOVERSION;  Surgeon: Lars Masson, MD;  Location: [facility] ENDOSCOPY;  Service: Cardiovascular;  Laterality: N/A;   CARDIOVERSION N/A 05/23/2017   Procedure: CARDIOVERSION;  Surgeon: Elease Hashimoto Deloris Ping, MD;  Location: [facility] ENDOSCOPY;  Service: Cardiovascular;  Laterality: N/A;   CARDIOVERSION N/A 09/09/2017   Procedure: CARDIOVERSION;  Surgeon: Pricilla Riffle, MD;  Location: [facility] ENDOSCOPY;  Service: Cardiovascular;  Laterality: N/A;   CARDIOVERSION N/A 09/30/2017   Procedure: CARDIOVERSION;  Surgeon: Laqueta Linden, MD;  Location: [facility] ENDOSCOPY;  Service: Cardiovascular;  Laterality: N/A;   EYE SURGERY     FINGER FRACTURE SURGERY Left "when I was a kid"   FRACTURE SURGERY  KNEE ARTHROSCOPY W/ DEBRIDEMENT Bilateral    RETINAL LASER PROCEDURE Bilateral    "tears on both; not detachments"   SHOULDER ARTHROSCOPY W/ ROTATOR CUFF REPAIR Bilateral    STAPEDES SURGERY Right    TONSILLECTOMY      TYMPANOPLASTY Bilateral    "skin grafts to ear drums"    MEDICATIONS: Current Outpatient Medications on File Prior to Visit  Medication Sig Dispense Refill   acyclovir (ZOVIRAX) 200 MG capsule Take 200 mg by mouth 2 (two) times daily as needed (first sign of fever blister).      Cholecalciferol (VITAMIN D3 PO) Take 5,000 Units by mouth daily.      dofetilide (TIKOSYN) 500 MCG capsule TAKE (1) CAPSULE TWICE DAILY. 180 capsule 3   doxycycline (VIBRA-TABS) 100 MG tablet Take 100 mg by mouth in the morning and at bedtime.     doxycycline (VIBRA-TABS) 100 MG tablet      levothyroxine (SYNTHROID) 100 MCG tablet Take 1 tablet by mouth daily.     losartan (COZAAR) 100 MG tablet Take 1 tablet (100 mg total) by mouth daily. 90 tablet 3   metoprolol succinate (TOPROL-XL) 50 MG 24 hr tablet Take 1 tablet (50 mg total) by mouth daily. Take with or immediately following a meal. 90 tablet 3   Multiple Vitamins-Minerals (PRESERVISION AREDS 2+MULTI VIT PO) Take 1 tablet by mouth in the morning and at bedtime.     rosuvastatin (CRESTOR) 20 MG tablet TAKE 1 TABLET ONCE DAILY. 90 tablet 3   spironolactone (ALDACTONE) 25 MG tablet Take 1 tablet by mouth daily.     XARELTO 20 MG TABS tablet Take 1 tablet (20 mg total) by mouth daily with supper. 90 tablet 1   Current Facility-Administered Medications on File Prior to Visit  Medication Dose Route Frequency Provider Last Rate Last Admin   0.9 %  sodium chloride infusion  250 mL Intravenous Continuous [name], MD       sodium chloride flush (NS) 0.9 % injection 3 mL  3 mL Intravenous Q12H [name], MD       sodium chloride flush (NS) 0.9 % injection 3 mL  3 mL Intravenous PRN [name], MD        ALLERGIES: Allergies  Allergen Reactions   Gabapentin Other (See Comments)    Causes Extreme sedation    Other     Not sure of name of med-for overactive bladder-caused some adverse reactions, stopped taking immediately-per patient   Atorvastatin  Other (See Comments)   Tamsulosin Hcl Other (See Comments)    drowsiness    FAMILY HISTORY: Family History  Problem Relation Age of Onset   Hypertension Mother    Stroke Brother    Stroke Maternal Grandmother     Objective:  Blood pressure 120/70, pulse 88, SpO2 98 %. General: No acute distress.  Patient appears well-groomed.   Head:  Normocephalic/atraumatic Eyes:  fundi examined but not visualized Neck: supple, no paraspinal tenderness, full range of motion Back: No paraspinal tenderness Heart: regular rate and rhythm Lungs: Clear to auscultation bilaterally. Vascular: No carotid bruits. Neurological Exam: Mental status: alert and oriented to person, place, and time, speech fluent and not dysarthric, language intact. Cranial nerves: CN I: not tested CN II: pupils equal, round and reactive to light, visual fields intact CN III, IV, VI:  full range of motion, no nystagmus, no ptosis CN V: facial sensation intact. CN VII: upper and lower face symmetric CN VIII: hearing intact CN IX,  X: gag intact, uvula midline CN XI: sternocleidomastoid and trapezius muscles intact CN XII: tongue midline Bulk & Tone: normal, no fasciculations. Motor:  muscle strength 5/5 throughout Sensation:  Pinprick, temperature and vibratory sensation intact. Deep Tendon Reflexes:  2+ throughout,  toes downgoing.   Finger to nose testing:  Without dysmetria.   Heel to shin:  Without dysmetria.   Gait:  Normal station and stride.  Romberg negative.    Thank you for allowing me to take part in the care of this patient.  Shon Millet, DO  CC: C Duane Lope, MD

## 2022-02-15 ENCOUNTER — Encounter: Payer: Self-pay | Admitting: Neurology

## 2022-02-15 ENCOUNTER — Ambulatory Visit: Payer: Medicare PPO | Admitting: Neurology

## 2022-02-15 VITALS — BP 120/70 | HR 88

## 2022-02-15 DIAGNOSIS — I10 Essential (primary) hypertension: Secondary | ICD-10-CM | POA: Diagnosis not present

## 2022-02-15 DIAGNOSIS — Z794 Long term (current) use of insulin: Secondary | ICD-10-CM

## 2022-02-15 DIAGNOSIS — E785 Hyperlipidemia, unspecified: Secondary | ICD-10-CM

## 2022-02-15 DIAGNOSIS — E1169 Type 2 diabetes mellitus with other specified complication: Secondary | ICD-10-CM | POA: Diagnosis not present

## 2022-02-15 DIAGNOSIS — I63412 Cerebral infarction due to embolism of left middle cerebral artery: Secondary | ICD-10-CM

## 2022-02-15 DIAGNOSIS — E119 Type 2 diabetes mellitus without complications: Secondary | ICD-10-CM | POA: Diagnosis not present

## 2022-02-15 DIAGNOSIS — I480 Paroxysmal atrial fibrillation: Secondary | ICD-10-CM | POA: Diagnosis not present

## 2022-02-15 NOTE — Patient Instructions (Signed)
Will check CTA of head. Further recommendations pending results.

## 2022-02-17 ENCOUNTER — Telehealth: Payer: Self-pay | Admitting: *Deleted

## 2022-02-17 ENCOUNTER — Other Ambulatory Visit (HOSPITAL_COMMUNITY): Payer: Self-pay

## 2022-02-17 MED ORDER — JARDIANCE 10 MG PO TABS
10.0000 mg | ORAL_TABLET | Freq: Every day | ORAL | 4 refills | Status: AC
  Filled 2022-02-17 – 2022-02-23 (×5): qty 90, 90d supply, fill #0
  Filled 2022-05-17: qty 90, 90d supply, fill #1

## 2022-02-17 NOTE — Telephone Encounter (Signed)
Pt now sees Dr Macon Large with [facility]. Called pharmacy and requested refill request be sent to [facility]. Pharmacy verbalized understanding.

## 2022-02-17 NOTE — Telephone Encounter (Signed)
Patient called requesting Xarelto 20 mg refill.

## 2022-02-18 ENCOUNTER — Other Ambulatory Visit (HOSPITAL_COMMUNITY): Payer: Self-pay

## 2022-02-18 DIAGNOSIS — Z9889 Other specified postprocedural states: Secondary | ICD-10-CM | POA: Diagnosis not present

## 2022-02-18 DIAGNOSIS — H7411 Adhesive right middle ear disease: Secondary | ICD-10-CM | POA: Diagnosis not present

## 2022-02-18 DIAGNOSIS — H906 Mixed conductive and sensorineural hearing loss, bilateral: Secondary | ICD-10-CM | POA: Diagnosis not present

## 2022-02-18 DIAGNOSIS — H6983 Other specified disorders of Eustachian tube, bilateral: Secondary | ICD-10-CM | POA: Diagnosis not present

## 2022-02-18 MED ORDER — DOXYCYCLINE HYCLATE 100 MG PO TABS
100.0000 mg | ORAL_TABLET | Freq: Two times a day (BID) | ORAL | 4 refills | Status: DC
  Filled 2022-07-28: qty 180, 90d supply, fill #0
  Filled 2022-11-12 – 2022-11-13 (×2): qty 180, 90d supply, fill #1

## 2022-02-18 MED ORDER — LEVOTHYROXINE SODIUM 112 MCG PO TABS
112.0000 ug | ORAL_TABLET | Freq: Every day | ORAL | 2 refills | Status: DC
  Filled 2022-04-05: qty 90, 90d supply, fill #0

## 2022-02-18 MED ORDER — GLUCOSE BLOOD VI STRP
ORAL_STRIP | 4 refills | Status: DC
  Filled 2022-02-20: qty 100, 50d supply, fill #0

## 2022-02-18 MED ORDER — EMPAGLIFLOZIN 10 MG PO TABS
10.0000 mg | ORAL_TABLET | Freq: Every day | ORAL | 12 refills | Status: DC
  Filled 2022-02-22 – 2022-05-15 (×2): qty 30, 30d supply, fill #0
  Filled 2022-08-23: qty 90, 90d supply, fill #0
  Filled 2022-11-12 – 2022-11-13 (×2): qty 90, 90d supply, fill #1

## 2022-02-18 MED ORDER — GLUCOSE BLOOD VI STRP: ORAL_STRIP | 4 refills | Status: DC

## 2022-02-18 MED ORDER — ACCU-CHEK SOFTCLIX LANCETS MISC
4 refills | Status: AC
  Filled 2022-04-05 – 2022-04-14 (×2): qty 100, 50d supply, fill #0
  Filled 2022-07-28: qty 100, 50d supply, fill #1

## 2022-02-18 MED ORDER — LEVOTHYROXINE SODIUM 112 MCG PO TABS
112.0000 ug | ORAL_TABLET | Freq: Every day | ORAL | 4 refills | Status: DC
  Filled 2022-02-23: qty 90, 90d supply, fill #0

## 2022-02-18 MED ORDER — ROSUVASTATIN CALCIUM 40 MG PO TABS
40.0000 mg | ORAL_TABLET | Freq: Every day | ORAL | 3 refills | Status: DC
  Filled 2022-02-22: qty 90, 90d supply, fill #0

## 2022-02-19 ENCOUNTER — Other Ambulatory Visit (HOSPITAL_COMMUNITY): Payer: Self-pay

## 2022-02-20 ENCOUNTER — Other Ambulatory Visit (HOSPITAL_COMMUNITY): Payer: Self-pay

## 2022-02-20 MED FILL — Rivaroxaban Tab 20 MG: ORAL | 90 days supply | Qty: 90 | Fill #0 | Status: AC

## 2022-02-22 ENCOUNTER — Other Ambulatory Visit (HOSPITAL_COMMUNITY): Payer: Self-pay

## 2022-02-22 MED FILL — Losartan Potassium Tab 100 MG: ORAL | 90 days supply | Qty: 90 | Fill #0 | Status: AC

## 2022-02-23 ENCOUNTER — Other Ambulatory Visit (HOSPITAL_COMMUNITY): Payer: Self-pay

## 2022-02-23 DIAGNOSIS — Z79899 Other long term (current) drug therapy: Secondary | ICD-10-CM | POA: Diagnosis not present

## 2022-02-23 DIAGNOSIS — I4819 Other persistent atrial fibrillation: Secondary | ICD-10-CM | POA: Diagnosis not present

## 2022-02-23 DIAGNOSIS — Z5181 Encounter for therapeutic drug level monitoring: Secondary | ICD-10-CM | POA: Diagnosis not present

## 2022-02-24 DIAGNOSIS — Z79899 Other long term (current) drug therapy: Secondary | ICD-10-CM | POA: Diagnosis not present

## 2022-02-24 DIAGNOSIS — Z8679 Personal history of other diseases of the circulatory system: Secondary | ICD-10-CM | POA: Diagnosis not present

## 2022-02-24 DIAGNOSIS — I4819 Other persistent atrial fibrillation: Secondary | ICD-10-CM | POA: Diagnosis not present

## 2022-02-24 DIAGNOSIS — Z9889 Other specified postprocedural states: Secondary | ICD-10-CM | POA: Diagnosis not present

## 2022-02-25 ENCOUNTER — Inpatient Hospital Stay: Admission: RE | Admit: 2022-02-25 | Payer: Medicare PPO | Source: Ambulatory Visit

## 2022-03-04 DIAGNOSIS — Z23 Encounter for immunization: Secondary | ICD-10-CM | POA: Diagnosis not present

## 2022-03-15 ENCOUNTER — Other Ambulatory Visit (HOSPITAL_COMMUNITY): Payer: Self-pay

## 2022-03-15 ENCOUNTER — Encounter (INDEPENDENT_AMBULATORY_CARE_PROVIDER_SITE_OTHER): Payer: Medicare PPO | Admitting: Ophthalmology

## 2022-03-15 DIAGNOSIS — I10 Essential (primary) hypertension: Secondary | ICD-10-CM | POA: Diagnosis not present

## 2022-03-15 DIAGNOSIS — H35372 Puckering of macula, left eye: Secondary | ICD-10-CM | POA: Diagnosis not present

## 2022-03-15 DIAGNOSIS — H353112 Nonexudative age-related macular degeneration, right eye, intermediate dry stage: Secondary | ICD-10-CM | POA: Diagnosis not present

## 2022-03-15 DIAGNOSIS — H35033 Hypertensive retinopathy, bilateral: Secondary | ICD-10-CM | POA: Diagnosis not present

## 2022-03-15 DIAGNOSIS — H43813 Vitreous degeneration, bilateral: Secondary | ICD-10-CM

## 2022-03-15 DIAGNOSIS — E113212 Type 2 diabetes mellitus with mild nonproliferative diabetic retinopathy with macular edema, left eye: Secondary | ICD-10-CM

## 2022-03-15 MED ORDER — PRESERVISION AREDS 2 PO CAPS
2.0000 | ORAL_CAPSULE | Freq: Every day | ORAL | 5 refills | Status: DC
  Filled 2022-03-15: qty 120, 60d supply, fill #0
  Filled 2022-04-08: qty 120, 30d supply, fill #0

## 2022-03-15 MED ORDER — CIPROFLOXACIN HCL 0.3 % OP SOLN
1.0000 [drp] | Freq: Four times a day (QID) | OPHTHALMIC | 12 refills | Status: AC
  Filled 2022-03-15: qty 5, 25d supply, fill #0
  Filled 2022-09-03: qty 10, 50d supply, fill #1

## 2022-03-16 ENCOUNTER — Other Ambulatory Visit (HOSPITAL_COMMUNITY): Payer: Self-pay

## 2022-03-18 DIAGNOSIS — E7800 Pure hypercholesterolemia, unspecified: Secondary | ICD-10-CM | POA: Diagnosis not present

## 2022-03-18 DIAGNOSIS — E11319 Type 2 diabetes mellitus with unspecified diabetic retinopathy without macular edema: Secondary | ICD-10-CM | POA: Diagnosis not present

## 2022-03-18 DIAGNOSIS — I10 Essential (primary) hypertension: Secondary | ICD-10-CM | POA: Diagnosis not present

## 2022-03-18 DIAGNOSIS — I679 Cerebrovascular disease, unspecified: Secondary | ICD-10-CM | POA: Diagnosis not present

## 2022-03-18 DIAGNOSIS — E039 Hypothyroidism, unspecified: Secondary | ICD-10-CM | POA: Diagnosis not present

## 2022-03-25 ENCOUNTER — Other Ambulatory Visit (HOSPITAL_COMMUNITY): Payer: Self-pay

## 2022-03-25 DIAGNOSIS — H90A31 Mixed conductive and sensorineural hearing loss, unilateral, right ear with restricted hearing on the contralateral side: Secondary | ICD-10-CM | POA: Diagnosis not present

## 2022-03-25 DIAGNOSIS — I6930 Unspecified sequelae of cerebral infarction: Secondary | ICD-10-CM | POA: Diagnosis not present

## 2022-03-25 DIAGNOSIS — H6993 Unspecified Eustachian tube disorder, bilateral: Secondary | ICD-10-CM | POA: Diagnosis not present

## 2022-03-25 DIAGNOSIS — Z9889 Other specified postprocedural states: Secondary | ICD-10-CM | POA: Diagnosis not present

## 2022-03-25 DIAGNOSIS — H906 Mixed conductive and sensorineural hearing loss, bilateral: Secondary | ICD-10-CM | POA: Diagnosis not present

## 2022-03-25 DIAGNOSIS — E11319 Type 2 diabetes mellitus with unspecified diabetic retinopathy without macular edema: Secondary | ICD-10-CM | POA: Diagnosis not present

## 2022-03-25 DIAGNOSIS — E119 Type 2 diabetes mellitus without complications: Secondary | ICD-10-CM | POA: Diagnosis not present

## 2022-03-25 DIAGNOSIS — Z888 Allergy status to other drugs, medicaments and biological substances status: Secondary | ICD-10-CM | POA: Diagnosis not present

## 2022-03-25 DIAGNOSIS — E039 Hypothyroidism, unspecified: Secondary | ICD-10-CM | POA: Diagnosis not present

## 2022-03-25 DIAGNOSIS — Z9622 Myringotomy tube(s) status: Secondary | ICD-10-CM | POA: Diagnosis not present

## 2022-03-25 DIAGNOSIS — E063 Autoimmune thyroiditis: Secondary | ICD-10-CM | POA: Diagnosis not present

## 2022-03-25 DIAGNOSIS — H7411 Adhesive right middle ear disease: Secondary | ICD-10-CM | POA: Diagnosis not present

## 2022-03-25 LAB — MICROALBUMIN/CREATININE RATIO, UR
Creatinine, POC: 50 mg/dL
Microalb Creat Ratio: 22.3
Microalbumin, Urine: 1.12

## 2022-03-25 MED ORDER — TRULICITY 0.75 MG/0.5ML ~~LOC~~ SOAJ
0.7500 mg | SUBCUTANEOUS | 4 refills | Status: DC
  Filled 2022-03-25: qty 6, 84d supply, fill #0
  Filled 2022-06-03 – 2022-06-09 (×3): qty 6, 84d supply, fill #1
  Filled 2022-08-23: qty 6, 84d supply, fill #2
  Filled 2022-12-06: qty 6, 84d supply, fill #3
  Filled 2023-02-19: qty 6, 84d supply, fill #4
  Filled ????-??-??: fill #1

## 2022-03-26 ENCOUNTER — Other Ambulatory Visit (HOSPITAL_COMMUNITY): Payer: Self-pay

## 2022-03-29 ENCOUNTER — Other Ambulatory Visit (HOSPITAL_COMMUNITY): Payer: Self-pay

## 2022-03-29 ENCOUNTER — Encounter: Payer: Self-pay | Admitting: Podiatry

## 2022-04-01 DIAGNOSIS — K4090 Unilateral inguinal hernia, without obstruction or gangrene, not specified as recurrent: Secondary | ICD-10-CM | POA: Diagnosis not present

## 2022-04-01 DIAGNOSIS — Z7901 Long term (current) use of anticoagulants: Secondary | ICD-10-CM | POA: Diagnosis not present

## 2022-04-01 DIAGNOSIS — I480 Paroxysmal atrial fibrillation: Secondary | ICD-10-CM | POA: Diagnosis not present

## 2022-04-01 DIAGNOSIS — Z6824 Body mass index (BMI) 24.0-24.9, adult: Secondary | ICD-10-CM | POA: Diagnosis not present

## 2022-04-02 ENCOUNTER — Other Ambulatory Visit: Payer: Self-pay

## 2022-04-02 ENCOUNTER — Encounter: Payer: Self-pay | Admitting: Podiatry

## 2022-04-02 ENCOUNTER — Ambulatory Visit: Payer: Medicare PPO | Admitting: Podiatry

## 2022-04-02 DIAGNOSIS — E876 Hypokalemia: Secondary | ICD-10-CM | POA: Diagnosis not present

## 2022-04-02 DIAGNOSIS — E119 Type 2 diabetes mellitus without complications: Secondary | ICD-10-CM

## 2022-04-02 DIAGNOSIS — Z794 Long term (current) use of insulin: Secondary | ICD-10-CM

## 2022-04-02 NOTE — Progress Notes (Signed)
This patient presents to the office for diabetic foot exam.  This patient says there is no pain or discomfort in her feet.  No history of infection or drainage.  This patient presents to the office for foot exam due to having a history of diabetes.  Patient is taking xarelto.  Vascular  Dorsalis pedis and posterior tibial pulses are palpable  B/L.  Capillary return  WNL.  Temperature gradient is  WNL.  Skin turgor  WNL  Sensorium  Senn Weinstein monofilament wire  WNL. Normal tactile sensation.  Nail Exam  Patient has normal nails with no evidence of bacterial or fungal infection.  Orthopedic  Exam  Muscle tone and muscle strength  WNL.  No limitations of motion feet  B/L.  No crepitus or joint effusion noted.  Foot type is unremarkable and digits show no abnormalities.  Bony prominences are unremarkable.  Skin  No open lesions.  Normal skin texture and turgor.   Diabetes with no complications  Diabetic foot exam was performed.  There is no evidence of vascular or neurologic pathology.  RTC  1 year.   Gardiner Barefoot DPM

## 2022-04-05 ENCOUNTER — Other Ambulatory Visit (HOSPITAL_COMMUNITY): Payer: Self-pay

## 2022-04-06 ENCOUNTER — Other Ambulatory Visit (HOSPITAL_COMMUNITY): Payer: Self-pay

## 2022-04-06 MED ORDER — SPIRONOLACTONE 25 MG PO TABS
25.0000 mg | ORAL_TABLET | Freq: Every day | ORAL | 2 refills | Status: DC
  Filled 2022-04-06: qty 90, 90d supply, fill #0
  Filled 2022-07-02: qty 90, 90d supply, fill #1
  Filled 2022-09-30: qty 90, 90d supply, fill #2

## 2022-04-08 ENCOUNTER — Other Ambulatory Visit (HOSPITAL_COMMUNITY): Payer: Self-pay

## 2022-04-12 ENCOUNTER — Encounter (INDEPENDENT_AMBULATORY_CARE_PROVIDER_SITE_OTHER): Payer: Medicare PPO | Admitting: Ophthalmology

## 2022-04-12 DIAGNOSIS — I10 Essential (primary) hypertension: Secondary | ICD-10-CM

## 2022-04-12 DIAGNOSIS — H35033 Hypertensive retinopathy, bilateral: Secondary | ICD-10-CM | POA: Diagnosis not present

## 2022-04-12 DIAGNOSIS — H43813 Vitreous degeneration, bilateral: Secondary | ICD-10-CM | POA: Diagnosis not present

## 2022-04-12 DIAGNOSIS — H35372 Puckering of macula, left eye: Secondary | ICD-10-CM

## 2022-04-12 DIAGNOSIS — H33303 Unspecified retinal break, bilateral: Secondary | ICD-10-CM

## 2022-04-12 DIAGNOSIS — E113312 Type 2 diabetes mellitus with moderate nonproliferative diabetic retinopathy with macular edema, left eye: Secondary | ICD-10-CM

## 2022-04-12 DIAGNOSIS — H353112 Nonexudative age-related macular degeneration, right eye, intermediate dry stage: Secondary | ICD-10-CM

## 2022-04-13 ENCOUNTER — Other Ambulatory Visit (HOSPITAL_COMMUNITY): Payer: Self-pay

## 2022-04-13 DIAGNOSIS — E1169 Type 2 diabetes mellitus with other specified complication: Secondary | ICD-10-CM | POA: Diagnosis not present

## 2022-04-13 DIAGNOSIS — I10 Essential (primary) hypertension: Secondary | ICD-10-CM | POA: Diagnosis not present

## 2022-04-13 DIAGNOSIS — E039 Hypothyroidism, unspecified: Secondary | ICD-10-CM | POA: Diagnosis not present

## 2022-04-13 DIAGNOSIS — Z7984 Long term (current) use of oral hypoglycemic drugs: Secondary | ICD-10-CM | POA: Diagnosis not present

## 2022-04-13 DIAGNOSIS — E7800 Pure hypercholesterolemia, unspecified: Secondary | ICD-10-CM | POA: Diagnosis not present

## 2022-04-13 MED ORDER — ACCU-CHEK GUIDE VI STRP
ORAL_STRIP | 4 refills | Status: AC
  Filled 2022-04-13: qty 100, 50d supply, fill #0
  Filled 2022-07-28: qty 100, 50d supply, fill #1
  Filled 2022-12-27 (×2): qty 100, 50d supply, fill #2
  Filled 2023-02-19: qty 100, 50d supply, fill #3

## 2022-04-14 ENCOUNTER — Other Ambulatory Visit (HOSPITAL_COMMUNITY): Payer: Self-pay

## 2022-04-17 ENCOUNTER — Other Ambulatory Visit (HOSPITAL_COMMUNITY): Payer: Self-pay

## 2022-04-21 ENCOUNTER — Telehealth: Payer: Self-pay | Admitting: Interventional Cardiology

## 2022-04-21 DIAGNOSIS — K4090 Unilateral inguinal hernia, without obstruction or gangrene, not specified as recurrent: Secondary | ICD-10-CM | POA: Diagnosis not present

## 2022-04-21 DIAGNOSIS — I4819 Other persistent atrial fibrillation: Secondary | ICD-10-CM | POA: Diagnosis not present

## 2022-04-21 DIAGNOSIS — E113292 Type 2 diabetes mellitus with mild nonproliferative diabetic retinopathy without macular edema, left eye: Secondary | ICD-10-CM | POA: Diagnosis not present

## 2022-04-21 NOTE — Telephone Encounter (Signed)
Left message for patient to callback to discuss lab results received from Dr. Daune Perch office.  Potassium level was 5.7 on 03/25/2022.  Dr. Katrinka Blazing would like to know if any medication changes were made and if there have been any follow-up labs since this time.  Note on lab sheet states Dr. Sharl Ma recommended low potassium diet and avoiding any potassium supplements. Also notes recommending a repeat potassium level the following week.

## 2022-04-26 ENCOUNTER — Telehealth: Payer: Self-pay

## 2022-04-26 NOTE — Telephone Encounter (Signed)
Patient with diagnosis of A Fib on Xarelto for anticoagulation.  Of note, patient is considering A Fib ablation. Will not be able to hold anticoagulation if he proceeds with ablation.   Procedure: ROBOTIC LEFT INGUINAL HERNIA REPAIR  Date of procedure: TBD   CHA2DS2-VASc Score = 4  This indicates a 4.8% annual risk of stroke. The patient's score is based upon: CHF History: 0 HTN History: 1 Diabetes History: 1 Stroke History: 0 Vascular Disease History: 1 Age Score: 1 Gender Score: 0   CrCl 72 mL/min Platelet count 213K   Per office protocol, patient can hold Xarelto  for 2 days prior to procedure.    Patient will not need bridging with Lovenox (enoxaparin) around procedure.  **This guidance is not considered finalized until pre-operative APP has relayed final recommendations.**

## 2022-04-26 NOTE — Telephone Encounter (Signed)
...     Pre-operative Risk Assessment    Patient Name: Anthony Cole  DOB: [birth year] MRN: [id number]      Request for Surgical Clearance    Procedure:   ROBOTIC LEFT INGUINAL HERNIA REPAIR Date of Surgery:  Clearance TBD                                 Surgeon:  Luretha Murphy MD Surgeon's Group or Practice Name:  [facility] Phone number:  [phone] Fax number:  [phone]   Type of Clearance Requested:   - Medical  - Pharmacy:  Hold Rivaroxaban (Xarelto)     Type of Anesthesia:  General    Additional requests/questions:    Jola Babinski   04/26/2022, 12:57 PM

## 2022-04-26 NOTE — Telephone Encounter (Signed)
Pt has appt 05/06/22 with DR. Smith. I have added need pre op clearance to appt notes

## 2022-04-26 NOTE — Telephone Encounter (Signed)
   Name: Anthony Cole  DOB: 1951/05/22  MRN: [id number]  Primary Cardiologist: Lesleigh Noe, MD   Preoperative team, please contact this patient and set up a phone call appointment for further preoperative risk assessment. Please obtain consent and complete medication review. Thank you for your help.  I confirm that guidance regarding antiplatelet and oral anticoagulation therapy has been completed and, if necessary, noted below.  Pharmacy has provided recommendations for holding anticoagulation.   Ronney Asters, NP 04/26/2022, 2:03 PM [facility]

## 2022-04-27 ENCOUNTER — Telehealth: Payer: Self-pay | Admitting: Interventional Cardiology

## 2022-04-27 NOTE — Telephone Encounter (Signed)
Left message for patient to callback.  Informed him (in message left on voicemail) we have received request to review recent labs/office visit regarding Xarelto and update on potassium level.  Patient has appt for pre-op clearance with Dr. Katrinka Blazing on 05/06/2022. Will discuss further at this visit.  Provided office number for callback if any questions or needs other than what is mentioned above.

## 2022-04-27 NOTE — Telephone Encounter (Signed)
Patient is calling to talk with Dr. Katrinka Blazing or nurse. Please call patient back

## 2022-04-30 ENCOUNTER — Other Ambulatory Visit (HOSPITAL_COMMUNITY): Payer: Self-pay

## 2022-05-01 ENCOUNTER — Other Ambulatory Visit (HOSPITAL_COMMUNITY): Payer: Self-pay

## 2022-05-01 MED FILL — Metoprolol Succinate Tab ER 24HR 50 MG (Tartrate Equiv): ORAL | 90 days supply | Qty: 90 | Fill #0 | Status: AC

## 2022-05-03 ENCOUNTER — Other Ambulatory Visit (HOSPITAL_COMMUNITY): Payer: Self-pay

## 2022-05-04 NOTE — Progress Notes (Incomplete)
Cardiology Office Note:    Date:  05/06/2022   ID:  CARI VANDEBERG, DOB 02-01-1951, MRN [id number]  PCP:  Lawerance Cruel, MD  Cardiologist:  Sinclair Grooms, MD   Referring MD: Lawerance Cruel, MD   Chief Complaint  Patient presents with   Atrial Fibrillation   Hypertension   Coronary Artery Disease   Advice Only    Anticoagulation therapy Health anxiety    History of Present Illness:    KOHEN REITHER is a 71 y.o. male with a hx of  obstructive sleep apnea on CPAP, hypertension, hyperlipidemia, CAD with coronary calcium score 250, diabetes mellitus II, paroxysmal atrial fibrillation s/p ablation 08/2017 & recurrent AFIB => Tykosyn10/07/2017. Repeat ablation discussed but not felt to be successful, and Tikosyn despite high burden  AF/flutter.     Rocky is doing okay but has many questions today.  He has developed a left inguinal hernia and needs to have a repair by Dr. Johnathan Hausen.  Dr. Hassell Done wants him off of Eliquis for 72 hours prior to surgery.  The patient is worried about stroke risk.  I encouraged the patient to go for with operation based upon the recommendations of Dr. Hassell Done.  In my absence, he is concerned about who will care for him in the future from a general cardiology and electrophysiology standpoint.  I gave him names of those that he should consider for EP and for general cardiology.  He will research and make a decision.   Past Medical History:  Diagnosis Date   Arthritis    Atrial fibrillation ([place])    Diabetes mellitus without complication ([place])    type 2   Hyperlipidemia    Hypertension    Hypothyroidism    Obesity (BMI 30-39.9) 01/25/2016   OSA on CPAP 07/16/2014   Moderate with AHI 19/hr on CPAP at 8cm H2O   Pre-diabetes    Visit for monitoring Tikosyn therapy 10/25/2017    Past Surgical History:  Procedure Laterality Date   ATRIAL FIBRILLATION ABLATION N/A 08/25/2017   Procedure: ATRIAL FIBRILLATION ABLATION;  Surgeon: Thompson Grayer, MD;  Location: [facility];  Service: Cardiovascular;  Laterality: N/A;   CARDIOVERSION  11/04/2011   Procedure: CARDIOVERSION;  Surgeon: Sinclair Grooms, MD;  Location: [facility];  Service: Cardiovascular;  Laterality: N/A;   CARDIOVERSION N/A 07/02/2013   Procedure: CARDIOVERSION;  Surgeon: Sinclair Grooms, MD;  Location: [facility];  Service: Cardiovascular;  Laterality: N/A;  10:07 elective cardioversion Lido 42m, Propofol 629mIV...200 joules, synched, pt presently in A flutter,...cardioverted to NSR...   CARDIOVERSION N/A 09/19/2013   Procedure: CARDIOVERSION;  Surgeon: HeSinclair GroomsMD;  [facility];  Service: Cardiovascular;  Laterality: N/A;   CARDIOVERSION N/A 08/13/2014   Procedure: CARDIOVERSION;  Surgeon: KaDorothy SparkMD;  [facility];  Service: Cardiovascular;  Laterality: N/A;   CARDIOVERSION N/A 05/23/2017   Procedure: CARDIOVERSION;  Surgeon: NaAcie FredricksonhWonda ChengMD;  [facility];  Service: Cardiovascular;  Laterality: N/A;   CARDIOVERSION N/A 09/09/2017   Procedure: CARDIOVERSION;  Surgeon: RoFay RecordsMD;  [facility];  Service: Cardiovascular;  Laterality: N/A;   CARDIOVERSION N/A 09/30/2017   Procedure: CARDIOVERSION;  Surgeon: KoHerminio CommonsMD;  [facility] Service: Cardi[facility];  Laterality: N/A;   EYFayetteeft "when I was a kid"   FRACTURE SURGERY     KNEE ARTHROSCOPY W/ DEBRIDEMENT  Bilateral    RETINAL LASER PROCEDURE Bilateral    "tears on both; not detachments"   SHOULDER ARTHROSCOPY W/ ROTATOR CUFF REPAIR Bilateral    STAPEDES SURGERY Right    TONSILLECTOMY     TYMPANOPLASTY Bilateral    "skin grafts to ear drums"    Current Medications: Current Meds  Medication Sig   ACCU-CHEK GUIDE test strip CHECK BLOOD SUGAR ONCE A DAY. 50   Accu-Chek Softclix Lancets lancets Use to check blood glucose twice daily.   acyclovir (ZOVIRAX) 200 MG capsule  Take 200 mg by mouth 2 (two) times daily as needed (first sign of fever blister).    Bevacizumab (AVASTIN IV) Inject into the vein every 30 (thirty) days. Eye Injection   Blood Glucose Monitoring Suppl (ACCU-CHEK GUIDE) w/Device KIT 1 each as directed   Cholecalciferol 50 MCG (2000 UT) TABS Take by mouth.   ciprofloxacin (CILOXAN) 0.3 % ophthalmic solution INSTILL ONE DROP INTO LEFT EYE 4 TIMES A DAY FOR 2 DAYS AFTER EACH MONTHLY EYE INJECTION   dofetilide (TIKOSYN) 500 MCG capsule TAKE 1 CAPSULE BY MOUTH TWICE DAILY.   doxycycline (VIBRA-TABS) 100 MG tablet Take 1 tablet (100 mg total) by mouth 2 (two) times daily.   Dulaglutide (TRULICITY) 3.55 HR/4.1UL SOPN Inject 0.75 mg into the skin once a week.   empagliflozin (JARDIANCE) 10 MG TABS tablet Take 1 tablet (10 mg total) by mouth daily.   glucose blood (ACCU-CHEK GUIDE) test strip Use to test blood sugars twice daily.   JARDIANCE 10 MG TABS tablet Take 10 mg by mouth daily.   levothyroxine (SYNTHROID) 112 MCG tablet Take 112 mcg by mouth every morning.   Multiple Vitamins-Minerals (PRESERVISION AREDS 2) CAPS Take 2 capsules by mouth daily.   Multiple Vitamins-Minerals (PRESERVISION AREDS 2+MULTI VIT PO) Take 1 tablet by mouth in the morning and at bedtime.   spironolactone (ALDACTONE) 25 MG tablet Take 1 tablet (25 mg total) by mouth daily.   [DISCONTINUED] losartan (COZAAR) 100 MG tablet Take 1 tablet (100 mg total) by mouth daily.   [DISCONTINUED] metoprolol succinate (TOPROL-XL) 50 MG 24 hr tablet Take 1 tablet (50 mg total) by mouth daily. Take with or immediately following a meal.   [DISCONTINUED] potassium chloride SA (KLOR-CON M) 20 MEQ tablet    [DISCONTINUED] rivaroxaban (XARELTO) 20 MG TABS tablet Take 1 tablet (20 mg total) by mouth daily   [DISCONTINUED] rosuvastatin (CRESTOR) 40 MG tablet Take 1 tablet (40 mg total) by mouth daily.   Current Facility-Administered Medications for the 05/06/22 encounter (Office Visit) with Belva Crome, MD  Medication   0.9 %  sodium chloride infusion   sodium chloride flush (NS) 0.9 % injection 3 mL   sodium chloride flush (NS) 0.9 % injection 3 mL     Allergies:   Gabapentin, Other, Atorvastatin, and Tamsulosin hcl   Social History   Socioeconomic History   Marital status: Single    Spouse name: Not on file   Number of children: Not on file   Years of education: Not on file   Highest education level: Not on file  Occupational History   Not on file  Tobacco Use   Smoking status: Never   Smokeless tobacco: Never  Vaping Use   Vaping Use: Never used  Substance and Sexual Activity   Alcohol use: No   Drug use: No   Sexual activity: Not Currently  Other Topics Concern   Not on file  Social History Narrative   Right handed  Social Determinants of Health   Financial Resource Strain: Not on file  Food Insecurity: Not on file  Transportation Needs: Not on file  Physical Activity: Not on file  Stress: Not on file  Social Connections: Not on file     Family History: The patient's family history includes Hypertension in his mother; Stroke in his brother and maternal grandmother.  ROS:   Please see the history of present illness.    He has not had any bleeding.  He is trying to decide whether he will have ablation by Dr. Omelia Blackwater.  Dr. Omelia Blackwater gave him a deadline of early summer 2024 to make a decision.  He still does not know what he will do.  All other systems reviewed and are negative.  EKGs/Labs/Other Studies Reviewed:    The following studies were reviewed today:  2D Doppler echocardiogram 09/24/2020: IMPRESSIONS   1. Left ventricular ejection fraction, by estimation, is 60 to 65%. The  left ventricle has normal function. The left ventricle has no regional  wall motion abnormalities. There is moderate concentric left ventricular  hypertrophy. Left ventricular  diastolic function could not be evaluated.   2. Right ventricular systolic function is normal.  The right ventricular  size is normal. Tricuspid regurgitation signal is inadequate for assessing  PA pressure.   3. Left atrial size was severely dilated.   4. Right atrial size was severely dilated.   5. The mitral valve is normal in structure. No evidence of mitral valve  regurgitation.   6. The aortic valve is tricuspid. Aortic valve regurgitation is not  visualized. Mild aortic valve sclerosis is present, with no evidence of  aortic valve stenosis.   7. Aortic dilatation noted. There is mild dilatation of the aortic root,  measuring 41 mm. There is borderline dilatation of the ascending aorta,  measuring 37 mm.   8. The inferior vena cava is dilated in size with <50% respiratory  variability, suggesting right atrial pressure of 15 mmHg.   Comparison(s): No significant change from prior study. Prior images  reviewed side by side.   EKG:  EKG atrial fibrillation/flutter with controlled ventricular response  Recent Labs: 10/15/2021: BUN 28; Creatinine, Ser 1.06; Hemoglobin 13.6; Magnesium 2.3; Platelets 213; Potassium 5.0; Sodium 139  Recent Lipid Panel    Component Value Date/Time   CHOL 131 08/10/2018 0926   TRIG 91 08/10/2018 0926   HDL 39 (L) 08/10/2018 0926   CHOLHDL 3.4 08/10/2018 0926   LDLCALC 74 08/10/2018 0926    Physical Exam:    VS:  BP 128/72   Pulse 70   Ht _0  (1.753 m)   Wt 163 lb 12.8 oz (74.3 kg)   SpO2 98%   BMI 24.19 kg/m     Wt Readings from Last 3 Encounters:  05/06/22 163 lb 12.8 oz (74.3 kg)  10/15/21 180 lb (81.6 kg)  08/03/21 174 lb 9.6 oz (79.2 kg)     GEN: Anxious appearing. No acute distress HEENT: Normal NECK: No JVD. LYMPHATICS: No lymphadenopathy CARDIAC: No murmur. RRR no gallop, or edema. VASCULAR:  Normal Pulses. No bruits. RESPIRATORY:  Clear to auscultation without rales, wheezing or rhonchi  ABDOMEN: Soft, non-tender, non-distended, No pulsatile mass, MUSCULOSKELETAL: No deformity  SKIN: Warm and dry NEUROLOGIC:   Alert and oriented x 3 PSYCHIATRIC:  Normal affect   ASSESSMENT:    1. Persistent atrial fibrillation ([place])   2. OSA (obstructive sleep apnea)   3. Essential hypertension   4. Long term current use  of anticoagulant therapy   5. Hyperlipidemia with target LDL less than 70   6. Preoperative clearance    PLAN:    In order of problems listed above:  Persistent atrial flutter/coarse atrial fibrillation with good rate control and no evidence of volume overload.  He continues on dofetilide therapy.  He is in discussion with Dr. Omelia Blackwater about repeat ablation.  Deadline would be early summer 2024 to consider going forward. Encouraged CPAP. Excellent blood pressure control.  Continue current medication Continue Eliquis therapy but pause per the instructions of Dr. Johnathan Hausen prior to herniorrhaphy Continue statin therapy Cleared for upcoming herniorrhaphy.  Okay to pause Eliquis for 48 to 72 hours prior to surgery without overlap Lovenox.  Clinical follow-up with [facility] for general cardiology and [facility] for EP.   Medication Adjustments/Labs and Tests Ordered: Current medicines are reviewed at length with the patient today.  Concerns regarding medicines are outlined above.  Orders Placed This Encounter  Procedures   Ambulatory referral to Cardiac Electrophysiology   EKG 12-Lead   Meds ordered this encounter  Medications   rivaroxaban (XARELTO) 20 MG TABS tablet    Sig: Take 1 tablet (20 mg total) by mouth daily    Dispense:  90 tablet    Refill:  3   rosuvastatin (CRESTOR) 40 MG tablet    Sig: Take 1 tablet (40 mg total) by mouth daily.    Dispense:  90 tablet    Refill:  3   losartan (COZAAR) 100 MG tablet    Sig: Take 1 tablet (100 mg total) by mouth daily.    Dispense:  90 tablet    Refill:  3   metoprolol succinate (TOPROL-XL) 50 MG 24 hr tablet    Sig: Take 1 tablet (50 mg total) by mouth daily. Take with or immediately following a meal.     Dispense:  90 tablet    Refill:  3   potassium chloride SA (KLOR-CON M) 20 MEQ tablet    Sig: Take 1 tablet (20 mEq total) by mouth daily.    Dispense:  90 tablet    Refill:  3    Patient Instructions  Medication Instructions:  Your physician recommends that you continue on your current medications as directed. Please refer to the Current Medication list given to you today.  *If you need a refill on your cardiac medications before your next appointment, please call your pharmacy*  Follow-Up: At [facility], you and your health needs are our priority.  As part of our continuing mission to provide you with exceptional heart care, we have created designated Provider Care Teams.  These Care Teams include your primary Cardiologist (physician) and Advanced Practice Providers (APPs -  Physician Assistants and Nurse Practitioners) who all work together to provide you with the care you need, when you need it.  Your next appointment:   6 month(s) with Dr. Marlou Porch, Dr. Johney Frame, or Dr. Gasper Sells 6-12 month(s) with Dr. Curt Bears (EP referral placed)  The format for your next appointment:   In Person  Provider:   Candee Furbish, MD or Gwyndolyn Kaufman, MD or Rudean Haskell, MD  Important Information About Sugar         Signed, Sinclair Grooms, MD  05/06/2022 1:30 PM    [place]

## 2022-05-06 ENCOUNTER — Other Ambulatory Visit (HOSPITAL_COMMUNITY): Payer: Self-pay

## 2022-05-06 ENCOUNTER — Ambulatory Visit: Payer: Medicare PPO | Attending: Interventional Cardiology | Admitting: Interventional Cardiology

## 2022-05-06 ENCOUNTER — Encounter: Payer: Self-pay | Admitting: Interventional Cardiology

## 2022-05-06 VITALS — BP 128/72 | HR 70 | Ht 69.0 in | Wt 163.8 lb

## 2022-05-06 DIAGNOSIS — Z01818 Encounter for other preprocedural examination: Secondary | ICD-10-CM

## 2022-05-06 DIAGNOSIS — Z7901 Long term (current) use of anticoagulants: Secondary | ICD-10-CM

## 2022-05-06 DIAGNOSIS — E785 Hyperlipidemia, unspecified: Secondary | ICD-10-CM

## 2022-05-06 DIAGNOSIS — I4819 Other persistent atrial fibrillation: Secondary | ICD-10-CM

## 2022-05-06 DIAGNOSIS — I10 Essential (primary) hypertension: Secondary | ICD-10-CM

## 2022-05-06 DIAGNOSIS — G4733 Obstructive sleep apnea (adult) (pediatric): Secondary | ICD-10-CM

## 2022-05-06 MED ORDER — ROSUVASTATIN CALCIUM 40 MG PO TABS
40.0000 mg | ORAL_TABLET | Freq: Every day | ORAL | 3 refills | Status: DC
  Filled 2022-05-06 – 2022-05-14 (×2): qty 90, 90d supply, fill #0
  Filled 2022-08-12: qty 90, 90d supply, fill #1
  Filled 2022-11-12 – 2022-11-13 (×2): qty 90, 90d supply, fill #2
  Filled 2023-02-19 – 2023-04-25 (×2): qty 90, 90d supply, fill #3

## 2022-05-06 MED ORDER — RIVAROXABAN 20 MG PO TABS
20.0000 mg | ORAL_TABLET | Freq: Every day | ORAL | 3 refills | Status: DC
  Filled 2022-05-06: qty 90, fill #0
  Filled 2022-05-15: qty 90, 90d supply, fill #0
  Filled 2022-08-12: qty 90, 90d supply, fill #1
  Filled 2022-10-26: qty 90, 90d supply, fill #2

## 2022-05-06 MED ORDER — POTASSIUM CHLORIDE CRYS ER 20 MEQ PO TBCR
20.0000 meq | EXTENDED_RELEASE_TABLET | Freq: Every day | ORAL | 3 refills | Status: DC
  Filled 2022-05-06: qty 90, 90d supply, fill #0

## 2022-05-06 MED ORDER — METOPROLOL SUCCINATE ER 50 MG PO TB24
50.0000 mg | ORAL_TABLET | Freq: Every day | ORAL | 3 refills | Status: DC
  Filled 2022-05-06 – 2022-07-28 (×2): qty 90, 90d supply, fill #0
  Filled 2022-10-23: qty 90, 90d supply, fill #1

## 2022-05-06 MED ORDER — LOSARTAN POTASSIUM 100 MG PO TABS
100.0000 mg | ORAL_TABLET | Freq: Every day | ORAL | 3 refills | Status: DC
  Filled 2022-05-06 – 2022-05-24 (×2): qty 90, 90d supply, fill #0
  Filled 2022-08-23: qty 90, 90d supply, fill #1
  Filled 2022-11-12 – 2022-11-13 (×2): qty 90, 90d supply, fill #2
  Filled 2023-02-19: qty 90, 90d supply, fill #3

## 2022-05-06 NOTE — Patient Instructions (Signed)
Medication Instructions:  Your physician recommends that you continue on your current medications as directed. Please refer to the Current Medication list given to you today.  *If you need a refill on your cardiac medications before your next appointment, please call your pharmacy*  Follow-Up: At [facility], you and your health needs are our priority.  As part of our continuing mission to provide you with exceptional heart care, we have created designated Provider Care Teams.  These Care Teams include your primary Cardiologist (physician) and Advanced Practice Providers (APPs -  Physician Assistants and Nurse Practitioners) who all work together to provide you with the care you need, when you need it.  Your next appointment:   6 month(s) with Dr. Anne Fu, Dr. Shari Prows, or Dr. Izora Ribas 6-12 month(s) with Dr. Elberta Fortis (EP referral placed)  The format for your next appointment:   In Person  Provider:   Donato Schultz, MD or Laurance Flatten, MD or Riley Lam, MD  Important Information About Sugar

## 2022-05-08 ENCOUNTER — Other Ambulatory Visit (HOSPITAL_COMMUNITY): Payer: Self-pay

## 2022-05-10 ENCOUNTER — Encounter (INDEPENDENT_AMBULATORY_CARE_PROVIDER_SITE_OTHER): Payer: Medicare PPO | Admitting: Ophthalmology

## 2022-05-10 DIAGNOSIS — E113291 Type 2 diabetes mellitus with mild nonproliferative diabetic retinopathy without macular edema, right eye: Secondary | ICD-10-CM

## 2022-05-10 DIAGNOSIS — H353112 Nonexudative age-related macular degeneration, right eye, intermediate dry stage: Secondary | ICD-10-CM

## 2022-05-10 DIAGNOSIS — E113312 Type 2 diabetes mellitus with moderate nonproliferative diabetic retinopathy with macular edema, left eye: Secondary | ICD-10-CM | POA: Diagnosis not present

## 2022-05-10 DIAGNOSIS — H35033 Hypertensive retinopathy, bilateral: Secondary | ICD-10-CM | POA: Diagnosis not present

## 2022-05-10 DIAGNOSIS — H43813 Vitreous degeneration, bilateral: Secondary | ICD-10-CM | POA: Diagnosis not present

## 2022-05-10 DIAGNOSIS — H33303 Unspecified retinal break, bilateral: Secondary | ICD-10-CM | POA: Diagnosis not present

## 2022-05-10 DIAGNOSIS — I10 Essential (primary) hypertension: Secondary | ICD-10-CM

## 2022-05-11 ENCOUNTER — Other Ambulatory Visit (HOSPITAL_COMMUNITY): Payer: Self-pay

## 2022-05-14 ENCOUNTER — Other Ambulatory Visit (HOSPITAL_COMMUNITY): Payer: Self-pay

## 2022-05-15 ENCOUNTER — Other Ambulatory Visit (HOSPITAL_COMMUNITY): Payer: Self-pay

## 2022-05-17 ENCOUNTER — Other Ambulatory Visit (HOSPITAL_COMMUNITY): Payer: Self-pay

## 2022-05-18 ENCOUNTER — Other Ambulatory Visit (HOSPITAL_COMMUNITY): Payer: Self-pay

## 2022-05-20 ENCOUNTER — Ambulatory Visit: Payer: Medicare PPO | Admitting: Neurology

## 2022-05-24 ENCOUNTER — Other Ambulatory Visit (HOSPITAL_COMMUNITY): Payer: Self-pay

## 2022-05-24 ENCOUNTER — Other Ambulatory Visit: Payer: Self-pay

## 2022-06-03 ENCOUNTER — Other Ambulatory Visit (HOSPITAL_COMMUNITY): Payer: Self-pay

## 2022-06-08 NOTE — Progress Notes (Signed)
Sent message, via epic in basket, requesting orders in epic from surgeon.  

## 2022-06-09 ENCOUNTER — Other Ambulatory Visit: Payer: Self-pay

## 2022-06-10 ENCOUNTER — Other Ambulatory Visit (HOSPITAL_BASED_OUTPATIENT_CLINIC_OR_DEPARTMENT_OTHER): Payer: Self-pay

## 2022-06-10 ENCOUNTER — Encounter (INDEPENDENT_AMBULATORY_CARE_PROVIDER_SITE_OTHER): Payer: Medicare PPO | Admitting: Ophthalmology

## 2022-06-10 DIAGNOSIS — I10 Essential (primary) hypertension: Secondary | ICD-10-CM

## 2022-06-10 DIAGNOSIS — H43813 Vitreous degeneration, bilateral: Secondary | ICD-10-CM

## 2022-06-10 DIAGNOSIS — E113312 Type 2 diabetes mellitus with moderate nonproliferative diabetic retinopathy with macular edema, left eye: Secondary | ICD-10-CM

## 2022-06-10 DIAGNOSIS — E113291 Type 2 diabetes mellitus with mild nonproliferative diabetic retinopathy without macular edema, right eye: Secondary | ICD-10-CM

## 2022-06-10 DIAGNOSIS — H33303 Unspecified retinal break, bilateral: Secondary | ICD-10-CM | POA: Diagnosis not present

## 2022-06-10 DIAGNOSIS — H35033 Hypertensive retinopathy, bilateral: Secondary | ICD-10-CM | POA: Diagnosis not present

## 2022-06-10 DIAGNOSIS — H35372 Puckering of macula, left eye: Secondary | ICD-10-CM

## 2022-06-10 MED ORDER — COMIRNATY 30 MCG/0.3ML IM SUSY
PREFILLED_SYRINGE | INTRAMUSCULAR | 0 refills | Status: DC
  Filled 2022-06-10: qty 0.3, 1d supply, fill #0

## 2022-06-10 NOTE — Patient Instructions (Addendum)
SURGICAL WAITING ROOM VISITATION Patients having surgery or a procedure may have no more than 2 support people in the waiting area - these visitors may rotate.    If the patient needs to stay at the hospital during part of their recovery, the visitor guidelines for inpatient rooms apply. Pre-op nurse will coordinate an appropriate time for 1 support person to accompany patient in pre-op.  This support person may not rotate.    Please refer to the [facility] website for the visitor guidelines for Inpatients (after your surgery is over and you are in a regular room).   Due to an increase in RSV and influenza rates and associated hospitalizations, children ages 8 and under may not visit patients in [place].     Your procedure is scheduled on: 06-25-22   Report to [facility] Main Entrance    Report to admitting at 8:15 AM   Call this number if you have problems the morning of surgery [phone]   Do not eat food :After Midnight.   After Midnight you may have the following liquids until 7:30 AM DAY OF SURGERY  Water Non-Citrus Juices (without pulp, NO RED) Carbonated Beverages Black Coffee (NO MILK/CREAM OR CREAMERS, sugar ok)  Clear Tea (NO MILK/CREAM OR CREAMERS, sugar ok) regular and decaf                             Plain Jell-O (NO RED)                                           Fruit ices (not with fruit pulp, NO RED)                                     Popsicles (NO RED)                                                               Sports drinks like Gatorade (NO RED)                       If you have questions, please contact your surgeon's office.   FOLLOW ANY ADDITIONAL PRE OP INSTRUCTIONS YOU RECEIVED FROM YOUR SURGEON'S OFFICE!!!     Oral Hygiene is also important to reduce your risk of infection.                                    Remember - BRUSH YOUR TEETH THE MORNING OF SURGERY WITH YOUR REGULAR TOOTHPASTE   Do NOT smoke after  Midnight   Take these medicines the morning of surgery with A SIP OF WATER:   Dofetilide  Doxycycline  Levothyroxine  Metoprolol  Rosuvastatin  Hold Xarelto 72 hours before surgery   How to Manage Your Diabetes Before and After Surgery  Why is it important to control my blood sugar before and after surgery? Improving blood sugar levels before and after surgery helps healing and can limit problems.  A way of improving blood sugar control is eating a healthy diet by:  Eating less sugar and carbohydrates  Increasing activity/exercise  Talking with your doctor about reaching your blood sugar goals High blood sugars (greater than 180 mg/dL) can raise your risk of infections and slow your recovery, so you will need to focus on controlling your diabetes during the weeks before surgery. Make sure that the doctor who takes care of your diabetes knows about your planned surgery including the date and location.  How do I manage my blood sugar before surgery? Check your blood sugar at least 4 times a day, starting 2 days before surgery, to make sure that the level is not too high or low. Check your blood sugar the morning of your surgery when you wake up and every 2 hours until you get to the Short Stay unit. If your blood sugar is less than 70 mg/dL, you will need to treat for low blood sugar: Do not take insulin. Treat a low blood sugar (less than 70 mg/dL) with  cup of clear juice (cranberry or apple), 4 glucose tablets, OR glucose gel. Recheck blood sugar in 15 minutes after treatment (to make sure it is greater than 70 mg/dL). If your blood sugar is not greater than 70 mg/dL on recheck, call [phone] for further instructions. Report your blood sugar to the short stay nurse when you get to Short Stay.  If you are admitted to the hospital after surgery: Your blood sugar will be checked by the staff and you will probably be given insulin after surgery (instead of oral diabetes medicines)  to make sure you have good blood sugar levels. The goal for blood sugar control after surgery is 80-180 mg/dL.   WHAT DO I DO ABOUT MY DIABETES MEDICATION?  Do not take oral diabetes medicines (pills) the morning of surgery.  Hold Jardiance 3 days before surgery (do not take after 0000000)   Hold Trulicity 7 days before surgery (do not take after 06-17-22).  DO NOT TAKE THE FOLLOWING 7 DAYS PRIOR TO SURGERY: Ozempic, Wegovy, Rybelsus (Semaglutide), Byetta (exenatide), Bydureon (exenatide ER), Victoza, Saxenda (liraglutide), or Trulicity (dulaglutide) Mounjaro (Tirzepatide) Adlyxin (Lixisenatide), Polyethylene Glycol Loxenatide.  Reviewed and Endorsed by [facility] Patient Education Committee, August 2015Y  Bring CPAP mask and tubing day of surgery.                              You may not have any metal on your body including  jewelry, and body piercing             Do not wear  lotions, powders, cologne or deodorant              Men may shave face and neck.   Do not bring valuables to the hospital. [place].   Contacts, dentures or bridgework may not be worn into surgery.   Bring small overnight bag day of surgery.   DO NOT [place]. PHARMACY WILL DISPENSE MEDICATIONS LISTED ON YOUR MEDICATION LIST TO YOU DURING YOUR ADMISSION [place]!                Please read over the following fact sheets you were given: IF Amasa Gwen  If you received a COVID test during your pre-op visit  it is  requested that you wear a mask when out in public, stay away from anyone that may not be feeling well and notify your surgeon if you develop symptoms. If you test positive for Covid or have been in contact with anyone that has tested positive in the last 10 days please notify you surgeon.  [place] - Preparing for Surgery Before surgery, you can play  an important role.  Because skin is not sterile, your skin needs to be as free of germs as possible.  You can reduce the number of germs on your skin by washing with CHG (chlorahexidine gluconate) soap before surgery.  CHG is an antiseptic cleaner which kills germs and bonds with the skin to continue killing germs even after washing. Please DO NOT use if you have an allergy to CHG or antibacterial soaps.  If your skin becomes reddened/irritated stop using the CHG and inform your nurse when you arrive at Short Stay. Do not shave (including legs and underarms) for at least 48 hours prior to the first CHG shower.  You may shave your face/neck.  Please follow these instructions carefully:  1.  Shower with CHG Soap the night before surgery and the  morning of surgery.  2.  If you choose to wash your hair, wash your hair first as usual with your normal  shampoo.  3.  After you shampoo, rinse your hair and body thoroughly to remove the shampoo.                             4.  Use CHG as you would any other liquid soap.  You can apply chg directly to the skin and wash.  Gently with a scrungie or clean washcloth.  5.  Apply the CHG Soap to your body ONLY FROM THE NECK DOWN.   Do   not use on face/ open                           Wound or open sores. Avoid contact with eyes, ears mouth and   genitals (private parts).                       Wash face,  Genitals (private parts) with your normal soap.             6.  Wash thoroughly, paying special attention to the area where your    surgery  will be performed.  7.  Thoroughly rinse your body with warm water from the neck down.  8.  DO NOT shower/wash with your normal soap after using and rinsing off the CHG Soap.                9.  Pat yourself dry with a clean towel.            10.  Wear clean pajamas.            11.  Place clean sheets on your bed the night of your first shower and do not  sleep with pets. Day of Surgery : Do not apply any lotions/deodorants  the morning of surgery.  Please wear clean clothes to the hospital/surgery center.  FAILURE TO FOLLOW THESE INSTRUCTIONS MAY RESULT IN THE CANCELLATION OF YOUR SURGERY  PATIENT SIGNATURE_________________________________  NURSE SIGNATURE__________________________________  ________________________________________________________________________

## 2022-06-10 NOTE — Progress Notes (Addendum)
COVID Vaccine Completed: Yes, booster 06-10-22  Date of COVID positive in last 90 days:  No  PCP - C. Melinda Crutch, MD Cardiologist - Daneen Schick, MD (Also seen at [facility] cardiology) Electrophysiologist - Thompson Grayer, MD (no longer at practice)  Cardiac clearance in Epic dated 05-06-22 by Dr. Daneen Schick  Chest x-ray - N/A EKG - 05-06-22 Epic Stress Test - 05-14-15 Epic ECHO - 09-24-20 Epic Cardiac Cath - N/A Pacemaker/ICD device last checked: Spinal Cord Stimulator:  N/A Long Term Monitor - 12-03-20 Epic Cardiac CT - 08-09-17 Epic  Bowel Prep - N/A  Sleep Study - Yes, +sleep apnea CPAP - Yes  Fasting Blood Sugar - 98 to 116 Checks Blood Sugar - several times a week   Trulicity Last dose of GLP1 agonist-  06-15-22 GLP1 instructions:  Hold 7 days before surgery, ldo not take after 06-17-22   Jardiance Last dose of SGLT-2 inhibitors-   SGLT-2 instructions: Hold 3 days before surgery, do not take after 06-21-22  Blood Thinner Instructions: Xarelto.  Hold x72 hours.  Patient aware Aspirin Instructions: Last Dose:  Activity level:  Can go up a flight of stairs and perform activities of daily living without stopping and without symptoms of chest pain or shortness of breath.  Able to exercise without symptoms, goes to gym daily   Anesthesia review: Afib/flutter with hx of ablation, HTN, OSA, DM  Patient denies shortness of breath, fever, cough and chest pain at PAT appointment  Patient verbalized understanding of instructions that were given to them at the PAT appointment. Patient was also instructed that they will need to review over the PAT instructions again at home before surgery.

## 2022-06-11 NOTE — Progress Notes (Signed)
Second request for pre op orders: Spoke with Abigail Butts Dr. Earlie Server office

## 2022-06-14 ENCOUNTER — Ambulatory Visit: Payer: Self-pay | Admitting: Surgery

## 2022-06-15 ENCOUNTER — Encounter (HOSPITAL_COMMUNITY): Payer: Self-pay

## 2022-06-15 ENCOUNTER — Other Ambulatory Visit: Payer: Self-pay

## 2022-06-15 ENCOUNTER — Encounter (HOSPITAL_COMMUNITY)
Admission: RE | Admit: 2022-06-15 | Discharge: 2022-06-15 | Disposition: A | Discharge status: Home / Self Care | Payer: Medicare PPO | Source: Ambulatory Visit | Attending: Surgery | Admitting: Surgery

## 2022-06-15 VITALS — BP 133/82 | HR 95 | Temp 97.7°F | Resp 16 | Ht 69.0 in | Wt 159.0 lb

## 2022-06-15 DIAGNOSIS — Z01812 Encounter for preprocedural laboratory examination: Secondary | ICD-10-CM | POA: Insufficient documentation

## 2022-06-15 DIAGNOSIS — K4090 Unilateral inguinal hernia, without obstruction or gangrene, not specified as recurrent: Secondary | ICD-10-CM | POA: Insufficient documentation

## 2022-06-15 DIAGNOSIS — E119 Type 2 diabetes mellitus without complications: Secondary | ICD-10-CM | POA: Diagnosis not present

## 2022-06-15 DIAGNOSIS — I10 Essential (primary) hypertension: Secondary | ICD-10-CM | POA: Diagnosis not present

## 2022-06-15 DIAGNOSIS — I2510 Atherosclerotic heart disease of native coronary artery without angina pectoris: Secondary | ICD-10-CM

## 2022-06-15 DIAGNOSIS — Z01818 Encounter for other preprocedural examination: Secondary | ICD-10-CM

## 2022-06-15 DIAGNOSIS — G4733 Obstructive sleep apnea (adult) (pediatric): Secondary | ICD-10-CM | POA: Diagnosis not present

## 2022-06-15 DIAGNOSIS — I4891 Unspecified atrial fibrillation: Secondary | ICD-10-CM | POA: Insufficient documentation

## 2022-06-15 LAB — HEMOGLOBIN A1C
Hgb A1c MFr Bld: 5.7 % — ABNORMAL HIGH (ref 4.8–5.6)
Mean Plasma Glucose: 116.89 mg/dL

## 2022-06-15 LAB — CBC
HCT: 42.6 % (ref 39.0–52.0)
Hemoglobin: 13.7 g/dL (ref 13.0–17.0)
MCH: 30.8 pg (ref 26.0–34.0)
MCHC: 32.2 g/dL (ref 30.0–36.0)
MCV: 95.7 fL (ref 80.0–100.0)
Platelets: 209 10*3/uL (ref 150–400)
RBC: 4.45 MIL/uL (ref 4.22–5.81)
RDW: 13.6 % (ref 11.5–15.5)
WBC: 6.8 10*3/uL (ref 4.0–10.5)
nRBC: 0 % (ref 0.0–0.2)

## 2022-06-15 LAB — BASIC METABOLIC PANEL
Anion gap: 7 (ref 5–15)
BUN: 28 mg/dL — ABNORMAL HIGH (ref 8–23)
CO2: 27 mmol/L (ref 22–32)
Calcium: 9.2 mg/dL (ref 8.9–10.3)
Chloride: 104 mmol/L (ref 98–111)
Creatinine, Ser: 0.88 mg/dL (ref 0.61–1.24)
GFR, Estimated: >60 mL/min (ref >60)
Glucose, Bld: 105 mg/dL — ABNORMAL HIGH (ref 70–99)
Potassium: 5 mmol/L (ref 3.5–5.1)
Sodium: 138 mmol/L (ref 135–145)

## 2022-06-15 LAB — GLUCOSE, CAPILLARY: Glucose-Capillary: 115 mg/dL — ABNORMAL HIGH (ref 70–99)

## 2022-06-16 NOTE — Progress Notes (Signed)
Anesthesia Chart Review   Case: [id number] Date/Time: 06/25/22 1015   Procedure: XI ROBOTIC ASSISTED LEFT INGUINAL HERNIA REPAIR (Left)   Anesthesia type: General   Pre-op diagnosis: left inguinal hernia   Location: [facility]   Surgeons: Johnathan Hausen, MD       DISCUSSION:71 y.o. never smoker with h/o HTN, atrial fibrillation, DM II, OSA on CPAP, left inguinal hernia scheduled for above procedure 06/25/2022 with Dr. Johnathan Hausen.   Pt last seen by cardiology 05/06/2022. Per OV note, "Cleared for upcoming herniorrhaphy. Okay to pause Eliquis for 48 to 72 hours prior to surgery without overlap Lovenox. "  Pt reports he will hold Xaretlo 72 hours prior to procedure.   Pt reports last dose of Ozempic is 06/15/2022.  VS: BP 133/82   Pulse 95   Temp 36.5 C (Oral)   Resp 16   Ht 5\' 9"  (1.753 m)   Wt 72.1 kg   SpO2 100%   BMI 23.48 kg/m   PROVIDERS: Lawerance Cruel, MD is PCP   Daneen Schick, MD is Cardiologist  LABS: Labs reviewed: Acceptable for surgery. (all labs ordered are listed, but only abnormal results are displayed)  Labs Reviewed  HEMOGLOBIN A1C - Abnormal; Notable for the following components:      Result Value   Hgb A1c MFr Bld 5.7 (*)    All other components within normal limits  BASIC METABOLIC PANEL - Abnormal; Notable for the following components:   Glucose, Bld 105 (*)    BUN 28 (*)    All other components within normal limits  GLUCOSE, CAPILLARY - Abnormal; Notable for the following components:   Glucose-Capillary 115 (*)    All other components within normal limits  CBC     IMAGES:   EKG:   CV: Echo 09/24/2020 1. Left ventricular ejection fraction, by estimation, is 60 to 65%. The  left ventricle has normal function. The left ventricle has no regional  wall motion abnormalities. There is moderate concentric left ventricular  hypertrophy. Left ventricular  diastolic function could not be evaluated.   2. Right ventricular systolic  function is normal. The right ventricular  size is normal. Tricuspid regurgitation signal is inadequate for assessing  PA pressure.   3. Left atrial size was severely dilated.   4. Right atrial size was severely dilated.   5. The mitral valve is normal in structure. No evidence of mitral valve  regurgitation.   6. The aortic valve is tricuspid. Aortic valve regurgitation is not  visualized. Mild aortic valve sclerosis is present, with no evidence of  aortic valve stenosis.   7. Aortic dilatation noted. There is mild dilatation of the aortic root,  measuring 41 mm. There is borderline dilatation of the ascending aorta,  measuring 37 mm.   8. The inferior vena cava is dilated in size with <50% respiratory  variability, suggesting right atrial pressure of 15 mmHg.   Myocardial Perfusion 05/14/2015 Nuclear stress EF: 59%. ST segment depression was noted during stress in the II, III, aVF and V5 leads. The perfusion study is normal. No T.I.D. This is a low risk study. False positive stress treadmill (ST depression). Fatigue only. If symptoms worsen or become more worrisome, further cardiac testing may be required. Past Medical History:  Diagnosis Date   Arthritis    Atrial fibrillation ([place])    Diabetes mellitus without complication ([place])    type 2   Hyperlipidemia    Hypertension    Hypothyroidism  Obesity (BMI 30-39.9) 01/25/2016   OSA on CPAP 07/16/2014   Moderate with AHI 19/hr on CPAP at 8cm H2O   Visit for monitoring Tikosyn therapy 10/25/2017    Past Surgical History:  Procedure Laterality Date   ATRIAL FIBRILLATION ABLATION N/A 08/25/2017   Procedure: ATRIAL FIBRILLATION ABLATION;  Surgeon: Thompson Grayer, MD;  Location: [place] [facility];  Service: Cardiovascular;  Laterality: N/A;   CARDIOVERSION  11/04/2011   Procedure: CARDIOVERSION;  Surgeon: Sinclair Grooms, MD;  Location: [place];  Service: Cardiovascular;  Laterality: N/A;   CARDIOVERSION N/A 07/02/2013    Procedure: CARDIOVERSION;  Surgeon: Sinclair Grooms, MD;  Location: [facility] ENDOSCOPY;  Service: Cardiovascular;  Laterality: N/A;  10:07 elective cardioversion Lido 40mg , Propofol 60mg ,IV...200 joules, synched, pt presently in A flutter,...cardioverted to NSR...   CARDIOVERSION N/A 09/19/2013   Procedure: CARDIOVERSION;  Surgeon: Sinclair Grooms, MD;  Location: [facility] ENDOSCOPY;  Service: Cardiovascular;  Laterality: N/A;   CARDIOVERSION N/A 08/13/2014   Procedure: CARDIOVERSION;  Surgeon: Dorothy Spark, MD;  Location: [place];  Service: Cardiovascular;  Laterality: N/A;   CARDIOVERSION N/A 05/23/2017   Procedure: CARDIOVERSION;  Surgeon: Thayer Headings, MD;  Location: [facility] ENDOSCOPY;  Service: Cardiovascular;  Laterality: N/A;   CARDIOVERSION N/A 09/09/2017   Procedure: CARDIOVERSION;  Surgeon: Fay Records, MD;  Location: [place];  Service: Cardiovascular;  Laterality: N/A;   CARDIOVERSION N/A 09/30/2017   Procedure: CARDIOVERSION;  Surgeon: Herminio Commons, MD;  Location: [place] ENDOSCOPY;  Service: Cardiovascular;  Laterality: N/A;   EYE SURGERY     FINGER FRACTURE SURGERY Left "when I was a kid"   FRACTURE SURGERY     KNEE ARTHROSCOPY W/ DEBRIDEMENT Bilateral    RETINAL LASER PROCEDURE Bilateral    "tears on both; not detachments"   SHOULDER ARTHROSCOPY W/ ROTATOR CUFF REPAIR Bilateral    STAPEDES SURGERY Right    TONSILLECTOMY     TYMPANOPLASTY Bilateral    "skin grafts to ear drums"    MEDICATIONS:  ACCU-CHEK GUIDE test strip   Accu-Chek Softclix Lancets lancets   acyclovir (ZOVIRAX) 200 MG capsule   Bevacizumab (AVASTIN IV)   Blood Glucose Monitoring Suppl (ACCU-CHEK GUIDE) w/Device KIT   Cholecalciferol 50 MCG (2000 UT) TABS   ciprofloxacin (CILOXAN) 0.3 % ophthalmic solution   COVID-19 mRNA vaccine 2023-2024 (COMIRNATY) syringe   dofetilide (TIKOSYN) 500 MCG capsule   doxycycline (VIBRA-TABS) 100 MG tablet   Dulaglutide (TRULICITY) 1.69 CV/8.9FY SOPN    empagliflozin (JARDIANCE) 10 MG TABS tablet   empagliflozin (JARDIANCE) 10 MG TABS tablet   glucose blood (ACCU-CHEK GUIDE) test strip   levothyroxine (SYNTHROID) 112 MCG tablet   losartan (COZAAR) 100 MG tablet   metoprolol succinate (TOPROL-XL) 50 MG 24 hr tablet   Multiple Vitamins-Minerals (PRESERVISION AREDS 2) CAPS   Multiple Vitamins-Minerals (PRESERVISION AREDS 2+MULTI VIT PO)   potassium chloride SA (KLOR-CON M) 20 MEQ tablet   rivaroxaban (XARELTO) 20 MG TABS tablet   rosuvastatin (CRESTOR) 40 MG tablet   spironolactone (ALDACTONE) 25 MG tablet    0.9 %  sodium chloride infusion   sodium chloride flush (NS) 0.9 % injection 3 mL   sodium chloride flush (NS) 0.9 % injection 3 mL    [facility] Ward, PA-C [facility] Pre-Surgical Testing [phone]

## 2022-06-23 NOTE — Anesthesia Preprocedure Evaluation (Addendum)
Anesthesia Evaluation  Patient identified by MRN, date of birth, ID band Patient awake  General Assessment Comment:Patient had trulicity 2/42 and Jardiance 1/17  Reviewed: Allergy & Precautions, NPO status , Patient's Chart, lab work & pertinent test results, reviewed documented beta blocker date and time   Airway Mallampati: II  TM Distance: >3 FB Neck ROM: Full    Dental no notable dental hx. (+) Teeth Intact, Dental Advisory Given   Pulmonary sleep apnea and Continuous Positive Airway Pressure Ventilation    Pulmonary exam normal breath sounds clear to auscultation       Cardiovascular hypertension, Pt. on home beta blockers and Pt. on medications Normal cardiovascular exam+ dysrhythmias Atrial Fibrillation  Rhythm:Regular Rate:Normal  TTE 2022  1. Left ventricular ejection fraction, by estimation, is 60 to 65%. The  left ventricle has normal function. The left ventricle has no regional  wall motion abnormalities. There is moderate concentric left ventricular  hypertrophy. Left ventricular  diastolic function could not be evaluated.   2. Right ventricular systolic function is normal. The right ventricular  size is normal. Tricuspid regurgitation signal is inadequate for assessing  PA pressure.   3. Left atrial size was severely dilated.   4. Right atrial size was severely dilated.   5. The mitral valve is normal in structure. No evidence of mitral valve  regurgitation.   6. The aortic valve is tricuspid. Aortic valve regurgitation is not  visualized. Mild aortic valve sclerosis is present, with no evidence of  aortic valve stenosis.   7. Aortic dilatation noted. There is mild dilatation of the aortic root,  measuring 41 mm. There is borderline dilatation of the ascending aorta,  measuring 37 mm.   8. The inferior vena cava is dilated in size with <50% respiratory  variability, suggesting right atrial pressure of 15 mmHg.      Neuro/Psych negative neurological ROS  negative psych ROS   GI/Hepatic negative GI ROS, Neg liver ROS,,,  Endo/Other  diabetes, Type 2, Oral Hypoglycemic AgentsHypothyroidism    Renal/GU negative Renal ROS  negative genitourinary   Musculoskeletal  (+) Arthritis ,    Abdominal   Peds  Hematology  (+) Blood dyscrasia (xarelto)   Anesthesia Other Findings   Reproductive/Obstetrics                             Anesthesia Physical Anesthesia Plan  ASA: 3  Anesthesia Plan: General   Post-op Pain Management: Tylenol PO (pre-op)*   Induction: Intravenous  PONV Risk Score and Plan: 2 and Dexamethasone, Ondansetron and Treatment may vary due to age or medical condition  Airway Management Planned: Oral ETT  Additional Equipment:   Intra-op Plan:   Post-operative Plan: Extubation in OR  Informed Consent: I have reviewed the patients History and Physical, chart, labs and discussed the procedure including the risks, benefits and alternatives for the proposed anesthesia with the patient or authorized representative who has indicated his/her understanding and acceptance.     Dental advisory given  Plan Discussed with: CRNA  Anesthesia Plan Comments: (2 IVs)       Anesthesia Quick Evaluation

## 2022-06-23 NOTE — Progress Notes (Signed)
Patient phoned today stating that he took his Trulicity today and is scheduled for surgery on 06-25-22.  Discussed with Lyndon Code, PA-C who recommended that he proceed as planned and follow instructions that were given to him at his PAT appointment.

## 2022-06-23 NOTE — H&P (Signed)
REFERRING PHYSICIAN: Lawerance Cruel, MD  PROVIDER: Joya San, MD  MRN: [id number] DOB: 12/14/1950   Chief Complaint: New Consultation (Inguinal hernia)   History of Present Illness: Anthony Cole is a 72 y.o. male who is seen today as an office consultation at the request of Dr. Harrington Challenger for evaluation of New Consultation (Inguinal hernia) .   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 He takes Xarelto for his chronic atrial fibrillation. He has had a failed ablation in the past. 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  We had a rather lengthy consultation regarding his surgery and I explained open as well as robotic left inguinal hernia repair in some detail. He lives alone so he would need to stay overnight in the hospital. I recommended that he come off his Xarelto for 3 days prior to the surgery. I did not recommend a Lovenox bridge. I would then probably operate on him keep him overnight and let him resume his Xarelto on postop day 2. He is very concerned about his stroke risk and I recognize that I said that he could and I will avoid surgery and wear a belt to support his hernia but I think he wants to get this repaired.  Review of Systems: See HPI as well for other ROS.  ROS   Medical History: Past Medical History:  Diagnosis Date  Arrhythmia  Atrial fibrillation (CMS-HCC)  Encounter for monitoring flecainide therapy 02/15/2014  History of amiodarone therapy 02/15/2014  Hypertension  Long term current use of antiarrhythmic drug 02/15/2014  Prior amiodarone Flecainide 11/2013  Thyroid disease   Patient Active Problem List  Diagnosis  BPH (benign prostatic hyperplasia)  HTN (hypertension)  Hypercholesteremia  Rosacea  Persistent atrial fibrillation (CMS-HCC)  Brachial neuritis  Hypogonadism male  Hypothyroidism  Encounter for long-term  (current) use of other medications  Chronic anticoagulation  Encounter for monitoring flecainide therapy  Long term current use of antiarrhythmic drug  History of amiodarone therapy  On dofetilide therapy  S/P ablation of atrial fibrillation  Adhesive otitis, right  Aortic atherosclerosis (CMS-HCC)  Asymmetrical hearing loss  Atrial flutter (CMS-HCC)  Combined forms of age-related cataract of both eyes  Diabetes mellitus type II, controlled (CMS-HCC)  Epiretinal membrane (ERM) of left eye  Eustachian tube dysfunction, bilateral  Impotence of organic origin  Mixed conductive and sensorineural hearing loss of right ear with restricted hearing of left ear  Mixed hearing loss, bilateral  Obesity (BMI 30-39.9), unspecified  OSA (obstructive sleep apnea)  Pain of left hand  PVD (posterior vitreous detachment), bilateral  Renal cyst  Retinal tears, multiple, without detachment, bilateral   Past Surgical History:  Procedure Laterality Date  ARTHROSCOPIC ROTATOR CUFF REPAIR Bilateral  KNEE ARTHROSCOPY Bilateral  MYRINGOTOMY    Allergies  Allergen Reactions  Gabapentin Other (See Comments)  Causes Extreme sedation  Tamsulosin Hcl Unknown  drowsiness  Glucosamine Rash   Current Outpatient Medications on File Prior to Visit  Medication Sig Dispense Refill  ACCU-CHEK GUIDE GLUCOSE METER Misc 1 each as directed  cholecalciferol (VITAMIN D3) 2,000 unit tablet Take 2,000 Units by mouth once daily  dofetilide (TIKOSYN) 500 MCG capsule Take 500 mcg by mouth every 12 (twelve) hours Twice daily  doxycycline (VIBRAMYCIN) 100 MG capsule Take 100 mg by mouth once daily  GLUCOSE BLOOD test strip 1 strip once daily  JARDIANCE 10  mg tablet Take 1 tablet by mouth once daily  levothyroxine (SYNTHROID) 112 MCG tablet Take 88 mcg by mouth once daily. Take on an empty stomach with a glass of water at least 30-60 minutes before breakfast.  losartan (COZAAR) 100 MG tablet Take 100 mg by mouth once  daily 1 tablet daily  metoprolol succinate (TOPROL-XL) 50 MG XL tablet Take 50 mg by mouth once daily 1 tablet daily  rivaroxaban (XARELTO) 20 mg tablet Take 1 tablet (20 mg total) by mouth once daily 90 tablet 2  rosuvastatin (CRESTOR) 20 MG tablet Take 20 mg by mouth once daily 1 tablet daily  spironolactone (ALDACTONE) 25 MG tablet Take 25 mg by mouth once daily  vit C/E/Zn/coppr/lutein/zeaxan (PRESERVISION AREDS-2 ORAL) Take 1 tablet by mouth 2 (two) times daily   No current facility-administered medications on file prior to visit.   Family History  Problem Relation Age of Onset  Stroke Maternal Grandmother    Social History   Tobacco Use  Smoking Status Never  Smokeless Tobacco Never    Social History   Socioeconomic History  Marital status: Single  Tobacco Use  Smoking status: Never  Smokeless tobacco: Never  Vaping Use  Vaping Use: Never used  Substance and Sexual Activity  Alcohol use: No  Alcohol/week: 0.0 standard drinks of alcohol  Drug use: Never   Objective:   Vitals:  04/21/22 1409  BP: 120/72  Pulse: 77  SpO2: 98%  Weight: 74.1 kg (163 lb 6.4 oz)  Height: 175.3 cm (5\' 9" )   Body mass index is 24.13 kg/m.  Physical Exam General: Thin white male in no acute distress, he wears glasses and has had some diabetes related retinopathy for which he has gotten a Avastin injections in his by Tempie Hoist. HEENT glasses Chest clear Heart SR-in and out of AF Abdomen is flat and nontender GU prominent left inguinal hernia that is bulging and easily visible. It likely contains the sigmoid colon as a sliding hernia. There is no right inguinal hernia noted.  Labs, Imaging and Diagnostic Testing: None to review  Assessment and Plan:  Diagnoses and all orders for this visit:  Left inguinal hernia  Persistent atrial fibrillation (CMS-HCC)  Controlled type 2 diabetes mellitus with mild nonproliferative retinopathy of left eye, macular edema presence  unspecified, unspecified whether long term insulin use (CMS-HCC)    We will schedule robotic left inguinal hernia at [facility] long under general anesthesia. He should stay off his Xarelto for 3 days prior to his surgery and we will keep him overnight.    Mikailah Morel Donia Pounds, MD

## 2022-06-25 ENCOUNTER — Ambulatory Visit (HOSPITAL_BASED_OUTPATIENT_CLINIC_OR_DEPARTMENT_OTHER): Payer: Medicare PPO | Admitting: Anesthesiology

## 2022-06-25 ENCOUNTER — Observation Stay (HOSPITAL_COMMUNITY)
Admission: RE | Admit: 2022-06-25 | Discharge: 2022-06-26 | Disposition: A | Discharge status: Home / Self Care | Payer: Medicare PPO | Source: Ambulatory Visit | Attending: Surgery | Admitting: Surgery

## 2022-06-25 ENCOUNTER — Encounter (HOSPITAL_COMMUNITY): Payer: Self-pay | Admitting: Surgery

## 2022-06-25 ENCOUNTER — Encounter (HOSPITAL_COMMUNITY)
Admission: RE | Disposition: A | Discharge status: Home / Self Care | Payer: Self-pay | Source: Ambulatory Visit | Attending: Surgery

## 2022-06-25 ENCOUNTER — Ambulatory Visit (HOSPITAL_COMMUNITY): Payer: Medicare PPO | Admitting: Physician Assistant

## 2022-06-25 ENCOUNTER — Other Ambulatory Visit: Payer: Self-pay

## 2022-06-25 DIAGNOSIS — K4090 Unilateral inguinal hernia, without obstruction or gangrene, not specified as recurrent: Secondary | ICD-10-CM | POA: Diagnosis not present

## 2022-06-25 DIAGNOSIS — I4819 Other persistent atrial fibrillation: Secondary | ICD-10-CM | POA: Diagnosis not present

## 2022-06-25 DIAGNOSIS — Z8719 Personal history of other diseases of the digestive system: Secondary | ICD-10-CM

## 2022-06-25 DIAGNOSIS — I10 Essential (primary) hypertension: Secondary | ICD-10-CM

## 2022-06-25 DIAGNOSIS — Z7901 Long term (current) use of anticoagulants: Secondary | ICD-10-CM | POA: Insufficient documentation

## 2022-06-25 DIAGNOSIS — Z79899 Other long term (current) drug therapy: Secondary | ICD-10-CM | POA: Diagnosis not present

## 2022-06-25 DIAGNOSIS — Z7984 Long term (current) use of oral hypoglycemic drugs: Secondary | ICD-10-CM | POA: Diagnosis not present

## 2022-06-25 DIAGNOSIS — E119 Type 2 diabetes mellitus without complications: Secondary | ICD-10-CM

## 2022-06-25 DIAGNOSIS — H7093 Unspecified mastoiditis, bilateral: Secondary | ICD-10-CM | POA: Diagnosis not present

## 2022-06-25 DIAGNOSIS — Z9989 Dependence on other enabling machines and devices: Secondary | ICD-10-CM | POA: Diagnosis not present

## 2022-06-25 DIAGNOSIS — G4733 Obstructive sleep apnea (adult) (pediatric): Secondary | ICD-10-CM

## 2022-06-25 LAB — CREATININE, SERUM
Creatinine, Ser: 0.88 mg/dL (ref 0.61–1.24)
GFR, Estimated: >60 mL/min (ref >60)

## 2022-06-25 LAB — CBC
HCT: 43.2 % (ref 39.0–52.0)
Hemoglobin: 14.1 g/dL (ref 13.0–17.0)
MCH: 30.8 pg (ref 26.0–34.0)
MCHC: 32.6 g/dL (ref 30.0–36.0)
MCV: 94.3 fL (ref 80.0–100.0)
Platelets: 205 10*3/uL (ref 150–400)
RBC: 4.58 MIL/uL (ref 4.22–5.81)
RDW: 13.9 % (ref 11.5–15.5)
WBC: 10.8 10*3/uL — ABNORMAL HIGH (ref 4.0–10.5)
nRBC: 0 % (ref 0.0–0.2)

## 2022-06-25 LAB — GLUCOSE, CAPILLARY
Glucose-Capillary: 130 mg/dL — ABNORMAL HIGH (ref 70–99)
Glucose-Capillary: 126 mg/dL — ABNORMAL HIGH (ref 70–99)
Glucose-Capillary: 195 mg/dL — ABNORMAL HIGH (ref 70–99)

## 2022-06-25 SURGERY — HERNIORRHAPHY, INGUINAL, ROBOT-ASSISTED, LAPAROSCOPIC
Anesthesia: General | Site: Pelvis | Laterality: Left

## 2022-06-25 MED ORDER — HEPARIN SODIUM (PORCINE) 5000 UNIT/ML IJ SOLN
5000.0000 [IU] | Freq: Three times a day (TID) | INTRAMUSCULAR | Status: DC
  Administered 2022-06-25 – 2022-06-26 (×2): 5000 [IU] via SUBCUTANEOUS
  Filled 2022-06-25 (×2): qty 1

## 2022-06-25 MED ORDER — SODIUM CHLORIDE (PF) 0.9 % IJ SOLN
INTRAMUSCULAR | Status: DC | PRN
  Administered 2022-06-25: 10 mL

## 2022-06-25 MED ORDER — LACTATED RINGERS IV SOLN: INTRAVENOUS | Status: DC | PRN

## 2022-06-25 MED ORDER — BUPIVACAINE LIPOSOME 1.3 % IJ SUSP
INTRAMUSCULAR | Status: AC
  Filled 2022-06-25: qty 20

## 2022-06-25 MED ORDER — DEXAMETHASONE SODIUM PHOSPHATE 10 MG/ML IJ SOLN
INTRAMUSCULAR | Status: DC | PRN
  Administered 2022-06-25: 6 mg via INTRAVENOUS

## 2022-06-25 MED ORDER — LACTATED RINGERS IV SOLN: INTRAVENOUS | Status: DC

## 2022-06-25 MED ORDER — PANTOPRAZOLE SODIUM 40 MG IV SOLR
40.0000 mg | Freq: Every day | INTRAVENOUS | Status: DC
  Administered 2022-06-25: 40 mg via INTRAVENOUS
  Filled 2022-06-25: qty 10

## 2022-06-25 MED ORDER — FENTANYL CITRATE PF 50 MCG/ML IJ SOSY
PREFILLED_SYRINGE | INTRAMUSCULAR | Status: AC
  Filled 2022-06-25: qty 1

## 2022-06-25 MED ORDER — FENTANYL CITRATE PF 50 MCG/ML IJ SOSY
25.0000 ug | PREFILLED_SYRINGE | INTRAMUSCULAR | Status: DC | PRN
  Administered 2022-06-25 (×2): 25 ug via INTRAVENOUS

## 2022-06-25 MED ORDER — FENTANYL CITRATE (PF) 100 MCG/2ML IJ SOLN
INTRAMUSCULAR | Status: DC | PRN
  Administered 2022-06-25: 50 ug via INTRAVENOUS

## 2022-06-25 MED ORDER — INSULIN ASPART 100 UNIT/ML IJ SOLN
0.0000 [IU] | INTRAMUSCULAR | Status: DC
  Administered 2022-06-25: 1 [IU] via SUBCUTANEOUS
  Administered 2022-06-25 – 2022-06-26 (×2): 2 [IU] via SUBCUTANEOUS

## 2022-06-25 MED ORDER — ONDANSETRON HCL 4 MG/2ML IJ SOLN
INTRAMUSCULAR | Status: DC | PRN
  Administered 2022-06-25: 4 mg via INTRAVENOUS

## 2022-06-25 MED ORDER — SCOPOLAMINE 1 MG/3DAYS TD PT72
1.0000 | MEDICATED_PATCH | TRANSDERMAL | Status: DC
  Filled 2022-06-25: qty 1

## 2022-06-25 MED ORDER — ONDANSETRON 4 MG PO TBDP: 4.0000 mg | ORAL_TABLET | Freq: Four times a day (QID) | ORAL | Status: DC | PRN

## 2022-06-25 MED ORDER — ORAL CARE MOUTH RINSE: 15.0000 mL | Freq: Once | OROMUCOSAL | Status: AC

## 2022-06-25 MED ORDER — LIDOCAINE 2% (20 MG/ML) 5 ML SYRINGE
INTRAMUSCULAR | Status: DC | PRN
  Administered 2022-06-25: 60 mg via INTRAVENOUS
  Administered 2022-06-25: 1.5 mg/kg/h via INTRAVENOUS

## 2022-06-25 MED ORDER — LEVOTHYROXINE SODIUM 112 MCG PO TABS
112.0000 ug | ORAL_TABLET | Freq: Every day | ORAL | Status: DC
  Administered 2022-06-26: 112 ug via ORAL
  Filled 2022-06-25: qty 1

## 2022-06-25 MED ORDER — PROPOFOL 10 MG/ML IV BOLUS
INTRAVENOUS | Status: DC | PRN
  Administered 2022-06-25: 120 mg via INTRAVENOUS

## 2022-06-25 MED ORDER — SUGAMMADEX SODIUM 200 MG/2ML IV SOLN
INTRAVENOUS | Status: DC | PRN
  Administered 2022-06-25: 200 mg via INTRAVENOUS

## 2022-06-25 MED ORDER — ONDANSETRON HCL 4 MG/2ML IJ SOLN: 4.0000 mg | Freq: Four times a day (QID) | INTRAMUSCULAR | Status: DC | PRN

## 2022-06-25 MED ORDER — DOFETILIDE 250 MCG PO CAPS
500.0000 ug | ORAL_CAPSULE | Freq: Two times a day (BID) | ORAL | Status: DC
  Administered 2022-06-25 – 2022-06-26 (×2): 500 ug via ORAL
  Filled 2022-06-25 (×2): qty 2

## 2022-06-25 MED ORDER — MIDAZOLAM HCL 2 MG/2ML IJ SOLN
INTRAMUSCULAR | Status: AC
  Filled 2022-06-25: qty 2

## 2022-06-25 MED ORDER — SODIUM CHLORIDE (PF) 0.9 % IJ SOLN
INTRAMUSCULAR | Status: AC
  Filled 2022-06-25: qty 10

## 2022-06-25 MED ORDER — METOPROLOL SUCCINATE ER 50 MG PO TB24
50.0000 mg | ORAL_TABLET | Freq: Every day | ORAL | Status: DC
  Administered 2022-06-26: 50 mg via ORAL
  Filled 2022-06-25: qty 1

## 2022-06-25 MED ORDER — MIDAZOLAM HCL 5 MG/5ML IJ SOLN
INTRAMUSCULAR | Status: DC | PRN
  Administered 2022-06-25: 2 mg via INTRAVENOUS

## 2022-06-25 MED ORDER — CEFAZOLIN SODIUM-DEXTROSE 2-4 GM/100ML-% IV SOLN
2.0000 g | Freq: Three times a day (TID) | INTRAVENOUS | Status: AC
  Administered 2022-06-25: 2 g via INTRAVENOUS
  Filled 2022-06-25: qty 100

## 2022-06-25 MED ORDER — ROCURONIUM BROMIDE 10 MG/ML (PF) SYRINGE
PREFILLED_SYRINGE | INTRAVENOUS | Status: DC | PRN
  Administered 2022-06-25: 70 mg via INTRAVENOUS
  Administered 2022-06-25: 20 mg via INTRAVENOUS

## 2022-06-25 MED ORDER — METOPROLOL TARTRATE 5 MG/5ML IV SOLN: 5.0000 mg | Freq: Four times a day (QID) | INTRAVENOUS | Status: DC | PRN

## 2022-06-25 MED ORDER — SPIRONOLACTONE 25 MG PO TABS
25.0000 mg | ORAL_TABLET | Freq: Every day | ORAL | Status: DC
  Administered 2022-06-25 – 2022-06-26 (×2): 25 mg via ORAL
  Filled 2022-06-25 (×2): qty 1

## 2022-06-25 MED ORDER — CHLORHEXIDINE GLUCONATE 0.12 % MT SOLN
15.0000 mL | Freq: Once | OROMUCOSAL | Status: AC
  Administered 2022-06-25: 15 mL via OROMUCOSAL

## 2022-06-25 MED ORDER — CHLORHEXIDINE GLUCONATE CLOTH 2 % EX PADS: 6.0000 | MEDICATED_PAD | Freq: Once | CUTANEOUS | Status: DC

## 2022-06-25 MED ORDER — CEFAZOLIN SODIUM-DEXTROSE 2-4 GM/100ML-% IV SOLN
2.0000 g | INTRAVENOUS | Status: AC
  Administered 2022-06-25: 2 g via INTRAVENOUS
  Filled 2022-06-25: qty 100

## 2022-06-25 MED ORDER — PHENYLEPHRINE 80 MCG/ML (10ML) SYRINGE FOR IV PUSH (FOR BLOOD PRESSURE SUPPORT)
PREFILLED_SYRINGE | INTRAVENOUS | Status: DC | PRN
  Administered 2022-06-25: 80 ug via INTRAVENOUS

## 2022-06-25 MED ORDER — BUPIVACAINE LIPOSOME 1.3 % IJ SUSP
INTRAMUSCULAR | Status: DC | PRN
  Administered 2022-06-25: 20 mL

## 2022-06-25 MED ORDER — KCL IN DEXTROSE-NACL 20-5-0.45 MEQ/L-%-% IV SOLN: INTRAVENOUS | Status: DC

## 2022-06-25 MED ORDER — PROPOFOL 10 MG/ML IV BOLUS
INTRAVENOUS | Status: AC
  Filled 2022-06-25: qty 20

## 2022-06-25 MED ORDER — BUPIVACAINE LIPOSOME 1.3 % IJ SUSP: 20.0000 mL | Freq: Once | INTRAMUSCULAR | Status: DC

## 2022-06-25 MED ORDER — HYDROCODONE-ACETAMINOPHEN 5-325 MG PO TABS
1.0000 | ORAL_TABLET | ORAL | Status: DC | PRN
  Administered 2022-06-25 – 2022-06-26 (×2): 1 via ORAL
  Filled 2022-06-25 (×2): qty 1

## 2022-06-25 MED ORDER — ACETAMINOPHEN 500 MG PO TABS
1000.0000 mg | ORAL_TABLET | ORAL | Status: AC
  Administered 2022-06-25: 1000 mg via ORAL
  Filled 2022-06-25: qty 2

## 2022-06-25 MED ORDER — FENTANYL CITRATE (PF) 250 MCG/5ML IJ SOLN
INTRAMUSCULAR | Status: AC
  Filled 2022-06-25: qty 5

## 2022-06-25 SURGICAL SUPPLY — 53 items
ANTIFOG SOL W/FOAM PAD STRL (MISCELLANEOUS) ×1
APPLICATOR COTTON TIP 6 STRL (MISCELLANEOUS) IMPLANT
APPLICATOR COTTON TIP 6IN STRL (MISCELLANEOUS)
BLADE SURG 15 STRL LF DISP TIS (BLADE) ×1 IMPLANT
BLADE SURG 15 STRL SS (BLADE) ×1
CATH FOLEY 2WAY SLVR  5CC 12FR (CATHETERS) ×1
CATH FOLEY 2WAY SLVR 5CC 12FR (CATHETERS) IMPLANT
CHLORAPREP W/TINT 26 (MISCELLANEOUS) ×1 IMPLANT
COVER MAYO STAND STRL (DRAPES) ×1 IMPLANT
COVER SURGICAL LIGHT HANDLE (MISCELLANEOUS) ×1 IMPLANT
COVER TIP SHEARS 8 DVNC (MISCELLANEOUS) ×1 IMPLANT
COVER TIP SHEARS 8MM DA VINCI (MISCELLANEOUS) ×1
DRAPE ARM DVNC X/XI (DISPOSABLE) ×3 IMPLANT
DRAPE COLUMN DVNC XI (DISPOSABLE) ×1 IMPLANT
DRAPE DA VINCI XI ARM (DISPOSABLE) ×3
DRAPE DA VINCI XI COLUMN (DISPOSABLE) ×1
ELECT REM PT RETURN 15FT ADLT (MISCELLANEOUS) ×1 IMPLANT
GLOVE SURG LX STRL 8.0 MICRO (GLOVE) ×2 IMPLANT
GOWN STRL REUS W/ TWL XL LVL3 (GOWN DISPOSABLE) ×2 IMPLANT
GOWN STRL REUS W/TWL XL LVL3 (GOWN DISPOSABLE) ×2
GRASPER SUT TROCAR 14GX15 (MISCELLANEOUS) IMPLANT
IRRIG SUCT STRYKERFLOW 2 WTIP (MISCELLANEOUS)
IRRIGATION SUCT STRKRFLW 2 WTP (MISCELLANEOUS) IMPLANT
KIT BASIN OR (CUSTOM PROCEDURE TRAY) ×1 IMPLANT
KIT TURNOVER KIT A (KITS) IMPLANT
MARKER SKIN DUAL TIP RULER LAB (MISCELLANEOUS) ×1 IMPLANT
MESH 3DMAX MID 4X6 LT LRG (Mesh General) IMPLANT
NEEDLE HYPO 22GX1.5 SAFETY (NEEDLE) ×1 IMPLANT
OBTURATOR OPTICAL STANDARD 8MM (TROCAR) ×1
OBTURATOR OPTICAL STND 8 DVNC (TROCAR) ×1
OBTURATOR OPTICALSTD 8 DVNC (TROCAR) ×1 IMPLANT
PACK CARDIOVASCULAR III (CUSTOM PROCEDURE TRAY) ×1 IMPLANT
PAD POSITIONING PINK XL (MISCELLANEOUS) ×1 IMPLANT
SCISSORS LAP 5X35 DISP (ENDOMECHANICALS) IMPLANT
SEAL CANN UNIV 5-8 DVNC XI (MISCELLANEOUS) ×3 IMPLANT
SEAL XI 5MM-8MM UNIVERSAL (MISCELLANEOUS) ×3
SOLUTION ANTFG W/FOAM PAD STRL (MISCELLANEOUS) ×1 IMPLANT
SOLUTION ELECTROLUBE (MISCELLANEOUS) ×1 IMPLANT
SPIKE FLUID TRANSFER (MISCELLANEOUS) ×1 IMPLANT
SUT MNCRL AB 4-0 PS2 18 (SUTURE) ×2 IMPLANT
SUT V-LOC BARB 180 2/0GR6 GS22 (SUTURE)
SUT VIC AB 2-0 SH 27 (SUTURE) ×2
SUT VIC AB 2-0 SH 27X BRD (SUTURE) ×1 IMPLANT
SUT VICRYL 0 TIES 12 18 (SUTURE) IMPLANT
SUT VLOC 180 2-0 9IN GS21 (SUTURE) ×1 IMPLANT
SUTURE V-LC BRB 180 2/0GR6GS22 (SUTURE) IMPLANT
SYR 20ML LL LF (SYRINGE) ×1 IMPLANT
TAPE STRIPS DRAPE STRL (GAUZE/BANDAGES/DRESSINGS) ×1 IMPLANT
TOWEL OR 17X26 10 PK STRL BLUE (TOWEL DISPOSABLE) ×1 IMPLANT
TOWEL OR NON WOVEN STRL DISP B (DISPOSABLE) ×1 IMPLANT
TRAY FOLEY MTR SLVR 14FR STAT (SET/KITS/TRAYS/PACK) IMPLANT
TROCAR XCEL NON-BLD 5MMX100MML (ENDOMECHANICALS) ×1 IMPLANT
TUBING INSUFFLATION 10FT LAP (TUBING) ×1 IMPLANT

## 2022-06-25 NOTE — Progress Notes (Signed)
Patient does not want to wear cpap and asked me to take it out of the room

## 2022-06-25 NOTE — Interval H&P Note (Signed)
History and Physical Interval Note:  06/25/2022 10:06 AM  Anthony Cole  has presented today for surgery, with the diagnosis of left inguinal hernia.  The various methods of treatment have been discussed with the patient and family. After consideration of risks, benefits and other options for treatment, the patient has consented to  Procedure(s): XI ROBOTIC ASSISTED LEFT INGUINAL HERNIA REPAIR (Left) as a surgical intervention.  The patient's history has been reviewed, patient examined, no change in status, stable for surgery.  I have reviewed the patient's chart and labs.  Questions were answered to the patient's satisfaction.     Pedro Earls

## 2022-06-25 NOTE — Op Note (Signed)
Anthony Cole  [birth year]   06/25/2022    PCP:  Lawerance Cruel, MD   Surgeon: Kaylyn Lim, MD, FACS  Asst:  None  Anes:  General  Preop Dx: Left inguinal hernia Postop Dx: Left indirect inguinal hernia with large  Procedure: Robotic assisted left inguinal hernia repair with medium weight large 3D max mesh Location Surgery: [place] [facility] long room 5 Complications: None noted  EBL:   minimal cc  Drains: Minimal  Description of Procedure:  The patient was taken to OR 5.  After anesthesia was administered and the patient was prepped  with ChloraPrep as well as Betadine on the scrotum and a timeout was performed.  Access to the abdomen was achieved to the left upper quadrant with an Optiview without difficulty.  Following insufflation the patient was placed in about 10 degrees of Trendelenburg and 2 other trocars about 12 cm apart were placed using robotic 8 mm trocars.  I then exchanged the 5 mm that I placed with an 8.  With the scope in I did an anterior block on the anterior superior leg spine on both the right and left sides under direct vision and then injected into the 3 trocar sites.  The robot was brought in and docked and attention was directed to the left lower quadrant where there was a large indirect hernia noted.  It went down into his scrotum.  A made an incision medially to the urachus and took the medial remnant vessel and went into the retrorectus space where I visualized the muscle and took that dissection down to the pubis and a little below.  Patient had a 12 Foley in place preop.  Carried this laterally.  I opened the the flaps laterally creating the 2 spaces of the visceral and parietal peritoneal spaces and then subsequently took down the membrane between the 2.  The sac was somewhat flimsy but an big and broad.  I dissected it free preserving the vas deferens and the vessels to the spermatic cord.  When I had this dissection complete and wide and deep paste placed a  piece of 3D max mesh from the left suture noted medially at the pubis anterior to that and then laterally.  Those were stitch secured with 4-0 Vicryl.  The flap was then closed with a running V-Loc suture from medial to lateral.  There was an area where the hernia sac had pulled away and there was an opening down posteriorly.  I closed all that incorporating portion of the sac with a V-Loc suture and buttressed it with another Vicryl suture as well.  Everything appeared to be in order with a good overlay of the mesh and it seated nicely.  Surveyed the abdomen everything appeared to be in order and then I closed the skin with 4-0 Monocryl after decompressing.  The patient lives alone will could be kept overnight.  Patient tolerated procedure well  The patient tolerated the procedure well and was taken to the PACU in stable condition.     Matt B. Hassell Done, [place], [facility], [place]

## 2022-06-25 NOTE — Anesthesia Procedure Notes (Signed)
Procedure Name: Intubation Date/Time: 06/25/2022 10:37 AM  Performed by: Gean Maidens, CRNAPre-anesthesia Checklist: Patient identified, Emergency Drugs available, Suction available, Patient being monitored and Timeout performed Patient Re-evaluated:Patient Re-evaluated prior to induction Oxygen Delivery Method: Circle system utilized Preoxygenation: Pre-oxygenation with 100% oxygen Induction Type: IV induction Ventilation: Mask ventilation without difficulty Laryngoscope Size: Mac and 4 Grade View: Grade I Tube type: Oral Tube size: 7.0 mm Number of attempts: 1 Airway Equipment and Method: Stylet Placement Confirmation: ETT inserted through vocal cords under direct vision, positive ETCO2 and breath sounds checked- equal and bilateral Secured at: 23 cm Tube secured with: Tape Dental Injury: Teeth and Oropharynx as per pre-operative assessment

## 2022-06-25 NOTE — Transfer of Care (Signed)
Immediate Anesthesia Transfer of Care Note  Patient: Anthony Cole  Procedure(s) Performed: XI ROBOTIC ASSISTED LEFT INGUINAL HERNIA REPAIR (Left: Pelvis)  Patient Location: PACU  Anesthesia Type:General  Level of Consciousness: sedated, patient cooperative, and responds to stimulation  Airway & Oxygen Therapy: Patient Spontanous Breathing and Patient connected to face mask oxygen  Post-op Assessment: Report given to RN and Post -op Vital signs reviewed and stable  Post vital signs: Reviewed and stable  Last Vitals:  Vitals Value Taken Time  BP 151/101 06/25/22 1345  Temp 36.9 C 06/25/22 1345  Pulse 93 06/25/22 1346  Resp 23 06/25/22 1346  SpO2 100 % 06/25/22 1346  Vitals shown include unvalidated device data.  Last Pain:  Vitals:   06/25/22 0851  TempSrc: Oral  PainSc:       Patients Stated Pain Goal: 4 (75/64/33 2951)  Complications: No notable events documented.

## 2022-06-26 ENCOUNTER — Other Ambulatory Visit (HOSPITAL_COMMUNITY): Payer: Self-pay

## 2022-06-26 DIAGNOSIS — I10 Essential (primary) hypertension: Secondary | ICD-10-CM | POA: Diagnosis not present

## 2022-06-26 DIAGNOSIS — Z7901 Long term (current) use of anticoagulants: Secondary | ICD-10-CM | POA: Diagnosis not present

## 2022-06-26 DIAGNOSIS — I4819 Other persistent atrial fibrillation: Secondary | ICD-10-CM | POA: Diagnosis not present

## 2022-06-26 DIAGNOSIS — Z79899 Other long term (current) drug therapy: Secondary | ICD-10-CM | POA: Diagnosis not present

## 2022-06-26 DIAGNOSIS — E119 Type 2 diabetes mellitus without complications: Secondary | ICD-10-CM | POA: Diagnosis not present

## 2022-06-26 DIAGNOSIS — K4090 Unilateral inguinal hernia, without obstruction or gangrene, not specified as recurrent: Secondary | ICD-10-CM | POA: Diagnosis not present

## 2022-06-26 LAB — GLUCOSE, CAPILLARY
Glucose-Capillary: 119 mg/dL — ABNORMAL HIGH (ref 70–99)
Glucose-Capillary: 119 mg/dL — ABNORMAL HIGH (ref 70–99)

## 2022-06-26 MED ORDER — TRAMADOL HCL 50 MG PO TABS
50.0000 mg | ORAL_TABLET | Freq: Four times a day (QID) | ORAL | 0 refills | Status: DC | PRN
  Filled 2022-06-26: qty 10, 3d supply, fill #0

## 2022-06-26 NOTE — Progress Notes (Signed)
0500, pt complained of chest pain 1-2/10. He stated it started at 0200 but he did not think it is necessary to let anybody know.   RN notified rapid response team for assessment. Notified MD on call.  EKG done and transmitted. Put pt on oxygen, pain med given.  Pt's vitals: BP 154/92, HR 77, 02 is 100 room air, RR 16.   Will monitor pt.

## 2022-06-26 NOTE — Progress Notes (Signed)
Reviewed written d/c instructions w pt, allowed pt time to read over each page of instructions. Pt took copious notes and asked multiple questions, all questions answered and he verbalized understanding. D/C via w/c w all belongings in stable condition.

## 2022-06-26 NOTE — TOC CM/SW Note (Signed)
Transition of Care [facility]) Screening Note  Patient Details  Name: Anthony Cole Date of Birth: 1951-03-07  Transition of Care [facility]) CM/SW Contact:    Sherie Don, LCSW Phone Number: 06/26/2022, 8:39 AM  Transition of Care Department [facility]) has reviewed patient and no TOC needs have been identified at this time. We will continue to monitor patient advancement through interdisciplinary progression rounds. If new patient transition needs arise, please place a TOC consult.

## 2022-06-26 NOTE — Progress Notes (Signed)
Rapid Response Event Note   Reason for Call : chest pain 1 out of 10   Initial Focused Assessment: pt A/O carrying on a full conversation.  Explaining that he has had chest pain during the night a 4/10.  But, now he has chest pain rating it at 1/10.  Pt doesn't seem to be be in any distress.  Pt has [place] applied.  See flowsheet for VS.  Breast sounds decreased.  No complaints of N &V.  Pt does say he can feel a small pinch of pain at his lower surgery site occasionally.    Interventions:  EKG done per protocol and General Surgery paged.   Plan of Care: Pt now denies CP, however explained the importance of informing his care team when he has CP.  Pt seems to feel that nothing can be done about his CP.  Made pt Primary RN aware to report to day RN to follow up with General Surgery MD regarding pt episodes of CP.  Pt continues to deny CP.  Event Summary:   MD Notified: MD paged, awaiting response  Call Time: 6659 Arrival Time: 9357 End Time: 0177  Dyann Ruddle, RN

## 2022-06-26 NOTE — Progress Notes (Signed)
1 Day Post-Op   Subjective/Chief Complaint: Doing fine had some left chest pain on back but this resolved when he turned on his side, doing fine now, tol diet, voided   Objective: Vital signs in last 24 hours: Temp:  [97.4 F (36.3 C)-98.5 F (36.9 C)] 97.7 F (36.5 C) (01/20 0531) Pulse Rate:  [68-118] 84 (01/20 0632) Resp:  [0-18] 15 (01/20 0632) BP: (136-156)/(82-111) 148/99 (01/20 6222) SpO2:  [97 %-100 %] 100 % (01/20 9798) Last BM Date : 06/23/22  Intake/Output from previous day: 01/19 0701 - 01/20 0700 In: 2520 [P.O.:720; I.V.:1700; IV Piggyback:100] Out: 9211 [Urine:1450; Blood:100] Intake/Output this shift: Total I/O In: 120 [P.O.:120] Out: 300 [Urine:300]  Ab soft nontender incisions clean, left groin mild swelling  Lab Results:  Recent Labs    06/25/22 1551  WBC 10.8*  HGB 14.1  HCT 43.2  PLT 205   BMET Recent Labs    06/25/22 1551  CREATININE 0.88   PT/INR No results for input(s): "LABPROT", "INR" in the last 72 hours. ABG No results for input(s): "PHART", "HCO3" in the last 72 hours.  Invalid input(s): "PCO2", "PO2"  Studies/Results: No results found.  Anti-infectives: Anti-infectives (From admission, onward)    Start     Dose/Rate Route Frequency Ordered Stop   06/25/22 2000  ceFAZolin (ANCEF) IVPB 2g/100 mL premix        2 g 200 mL/hr over 30 Minutes Intravenous Every 8 hours 06/25/22 1523 06/25/22 2036   06/25/22 0830  ceFAZolin (ANCEF) IVPB 2g/100 mL premix        2 g 200 mL/hr over 30 Minutes Intravenous On call to O.R. 06/25/22 0828 06/25/22 1046       Assessment/Plan: POD 1 LIH -home today -chest discomfort was positional, vitals fine, has had before, do not think needs anything further   Rolm Bookbinder 06/26/2022

## 2022-06-26 NOTE — Discharge Instructions (Signed)
[facility]  UMBILICAL OR INGUINAL HERNIA REPAIR: POST OP INSTRUCTIONS Restart xarelto on Monday Always review your discharge instruction sheet given to you by the facility where your surgery was performed. IF YOU HAVE DISABILITY OR FAMILY LEAVE FORMS, YOU MUST BRING THEM TO THE OFFICE FOR PROCESSING.   DO NOT GIVE THEM TO YOUR DOCTOR.  A  prescription for pain medication may be given to you upon discharge.  Take your pain medication as prescribed, if needed.  If narcotic pain medicine is not needed, then you may take acetaminophen (Tylenol), naprosyn (Alleve) or ibuprofen (Advil) as needed. Take your usually prescribed medications unless otherwise directed. If you need a refill on your pain medication, please contact your pharmacy.  They will contact our office to request authorization. Prescriptions will not be filled after 5 pm or on week-ends. You should follow a light diet the first 24 hours after arrival home, such as soup and crackers, etc.  Be sure to include lots of fluids daily.  Resume your normal diet the day after surgery. Most patients will experience some swelling and bruising around the umbilicus or in the groin and scrotum.  Ice packs and reclining will help.  Swelling and bruising can take several days to resolve.  It is common to experience some constipation if taking pain medication after surgery.  Increasing fluid intake and taking a stool softener (such as Colace) will usually help or prevent this problem from occurring.  A mild laxative (Milk of Magnesia or Miralax) should be taken according to package directions if there are no bowel movements after 48 hours. Unless discharge instructions indicate otherwise, you may remove your bandages 48 hours after surgery, and you may shower at that time.  You may have steri-strips (small skin tapes) in place directly over the incision.  These strips should be left on the skin for 7-10 days and will come off on their  own.  If your surgeon used skin glue on the incision, you may shower in 24 hours.  The glue will flake off over the next 2-3 weeks.  Any sutures or staples will be removed at the office during your follow-up visit. ACTIVITIES:  You may resume regular (light) daily activities beginning the next day--such as daily self-care, walking, climbing stairs--gradually increasing activities as tolerated.  You may have sexual intercourse when it is comfortable.  Refrain from any heavy lifting or straining until approved by your doctor. You may drive when you are no longer taking prescription pain medication, you can comfortably wear a seatbelt, and you can safely maneuver your car and apply brakes. RETURN TO WORK:  __________________________________________________________ Dennis Bast should see your doctor in the office for a follow-up appointment approximately 2-3 weeks after your surgery.  Make sure that you call for this appointment within a day or two after you arrive home to insure a convenient appointment time. OTHER INSTRUCTIONS:  __________________________________________________________________________________________________________________________________________________________________________________________  WHEN TO CALL YOUR DOCTOR: Fever over 101.0 Inability to urinate Nausea and/or vomiting Extreme swelling or bruising Continued bleeding from incision. Increased pain, redness, or drainage from the incision  The clinic staff is available to answer your questions during regular business hours.  Please don't hesitate to call and ask to speak to one of the nurses for clinical concerns.  If you have a medical emergency, go to the nearest emergency room or call 911.  A surgeon from [facility] is always on call at the hospital   [address] [place],  [place], [place]  [address] ?  [address], [place], [place]   [phone] ? [phone] ? FAX [phone] Web site:  [email or web address]

## 2022-06-28 LAB — GLUCOSE, CAPILLARY: Glucose-Capillary: 159 mg/dL — ABNORMAL HIGH (ref 70–99)

## 2022-06-28 NOTE — Discharge Summary (Signed)
Physician Discharge Summary  Patient ID: Anthony Cole MRN: [id number] DOB/AGE: 71-07-1950 71 y.o.  PCP: Lawerance Cruel, MD  Admit date: 06/25/2022 Discharge date: 06/28/2022  Admission Diagnoses:  [facility]  Discharge Diagnoses:  Left indirect inguinal hernia  Principal Problem:   S/P left inguinal hernia repair   Surgery:  Xi robotic left inguinal hernia  Discharged Condition: improved  Hospital Course:   The patient underwent robotic LIH on Friday.  Kept overnight (patient lives alone and had no next of kin) and discharged on Saturday per Dr. Donne Hazel.  Advised to hold Xarelto until Monday.    Consults: none  Significant Diagnostic Studies: none    Discharge Exam: Blood pressure (!) 148/99, pulse 84, temperature 97.7 F (36.5 C), temperature source Oral, resp. rate 15, height 5\' 9"  (1.753 m), weight 72.1 kg, SpO2 100 %. Incisions OK  Disposition: Discharge disposition: 01-Home or Self Care        Allergies as of 06/26/2022       Reactions   Gabapentin Other (See Comments)   Causes Extreme sedation (Drowsy)   Other    Not sure of name of med-for overactive bladder-caused some adverse reactions, stopped taking immediately-per patient   Androgel [testosterone]    Pt preference    Flomax [tamsulosin Hcl] Other (See Comments)   drowsiness   Lipitor [atorvastatin] Other (See Comments)   Responds better with Crestor         Medication List     TAKE these medications    Accu-Chek Guide test strip Generic drug: glucose blood CHECK BLOOD SUGAR ONCE A DAY. 50   Accu-Chek Guide test strip Generic drug: glucose blood Use to test blood sugars twice daily.   Accu-Chek Guide w/Device Kit 1 each as directed   Accu-Chek Softclix Lancets lancets Use to check blood glucose twice daily.   acyclovir 200 MG capsule Commonly known as: ZOVIRAX Take 200 mg by mouth 2 (two) times daily as needed (first sign of fever blister).   AVASTIN IV Inject into the vein  every 30 (thirty) days. Eye Injection   Cholecalciferol 50 MCG (2000 UT) Tabs Take 2,000 Units by mouth daily.   ciprofloxacin 0.3 % ophthalmic solution Commonly known as: CILOXAN INSTILL ONE DROP INTO LEFT EYE 4 TIMES A DAY FOR 2 DAYS AFTER EACH MONTHLY EYE INJECTION   Comirnaty syringe Generic drug: COVID-19 mRNA vaccine 2023-2024 Inject into the muscle.   dofetilide 500 MCG capsule Commonly known as: TIKOSYN TAKE 1 CAPSULE BY MOUTH TWICE DAILY.   doxycycline 100 MG tablet Commonly known as: VIBRA-TABS Take 1 tablet (100 mg total) by mouth 2 (two) times daily. What changed:  when to take this additional instructions   empagliflozin 10 MG Tabs tablet Commonly known as: JARDIANCE Take 1 tablet (10 mg total) by mouth daily.   Jardiance 10 MG Tabs tablet Generic drug: empagliflozin Take 1 tablet (10 mg total) by mouth daily.   levothyroxine 112 MCG tablet Commonly known as: SYNTHROID Take 112 mcg by mouth every morning.   losartan 100 MG tablet Commonly known as: COZAAR Take 1 tablet (100 mg total) by mouth daily.   metoprolol succinate 50 MG 24 hr tablet Commonly known as: TOPROL-XL Take 1 tablet (50 mg total) by mouth daily. Take with or immediately following a meal.   potassium chloride SA 20 MEQ tablet Commonly known as: KLOR-CON M Take 1 tablet (20 mEq total) by mouth daily.   PRESERVISION AREDS 2+MULTI VIT PO Take 1 tablet by mouth in  the morning and at bedtime.   PreserVision AREDS 2 Caps Take 2 capsules by mouth daily.   rosuvastatin 40 MG tablet Commonly known as: CRESTOR Take 1 tablet (40 mg total) by mouth daily.   spironolactone 25 MG tablet Commonly known as: ALDACTONE Take 1 tablet (25 mg total) by mouth daily.   traMADol 50 MG tablet Commonly known as: ULTRAM Take 1 tablet (50 mg total) by mouth every 6 (six) hours as needed.   Trulicity 6.44 IH/4.7QQ Sopn Generic drug: Dulaglutide Inject 0.75 mg into the skin once a week.   Xarelto  20 MG Tabs tablet Generic drug: rivaroxaban Take 1 tablet (20 mg total) by mouth daily        Follow-up Information     Johnathan Hausen, MD Follow up in 3 week(s).   Specialty: General Surgery Contact information: [place] [phone]                 Signed: Pedro Earls 06/28/2022, 7:53 AM

## 2022-06-28 NOTE — Anesthesia Postprocedure Evaluation (Signed)
Anesthesia Post Note  Patient: Anthony Cole  Procedure(s) Performed: XI ROBOTIC ASSISTED LEFT INGUINAL HERNIA REPAIR (Left: Pelvis)     Patient location during evaluation: PACU Anesthesia Type: General Level of consciousness: awake and alert Pain management: pain level controlled Vital Signs Assessment: post-procedure vital signs reviewed and stable Respiratory status: spontaneous breathing, nonlabored ventilation, respiratory function stable and patient connected to nasal cannula oxygen Cardiovascular status: blood pressure returned to baseline and stable Postop Assessment: no apparent nausea or vomiting Anesthetic complications: no   No notable events documented.  Last Vitals:  Vitals:   06/26/22 0531 06/26/22 0632  BP: (!) 154/92 (!) 148/99  Pulse: 77 84  Resp: 16 15  Temp: 36.5 C   SpO2: 100% 100%    Last Pain:  Vitals:   06/26/22 0800  TempSrc:   PainSc: 0-No pain                 [name]

## 2022-07-03 ENCOUNTER — Other Ambulatory Visit (HOSPITAL_COMMUNITY): Payer: Self-pay

## 2022-07-05 ENCOUNTER — Other Ambulatory Visit: Payer: Self-pay

## 2022-07-05 ENCOUNTER — Other Ambulatory Visit (HOSPITAL_COMMUNITY): Payer: Self-pay

## 2022-07-05 ENCOUNTER — Encounter: Payer: Self-pay | Admitting: Cardiology

## 2022-07-05 ENCOUNTER — Ambulatory Visit: Payer: Medicare PPO | Attending: Cardiology | Admitting: Cardiology

## 2022-07-05 VITALS — BP 138/80 | HR 89 | Ht 69.0 in | Wt 157.0 lb

## 2022-07-05 DIAGNOSIS — I4819 Other persistent atrial fibrillation: Secondary | ICD-10-CM | POA: Diagnosis not present

## 2022-07-05 DIAGNOSIS — D6869 Other thrombophilia: Secondary | ICD-10-CM | POA: Diagnosis not present

## 2022-07-05 DIAGNOSIS — Z79899 Other long term (current) drug therapy: Secondary | ICD-10-CM

## 2022-07-05 MED ORDER — LEVOTHYROXINE SODIUM 112 MCG PO TABS
112.0000 ug | ORAL_TABLET | Freq: Every day | ORAL | 2 refills | Status: DC
  Filled 2022-07-05 – 2022-07-06 (×2): qty 90, 90d supply, fill #0
  Filled 2022-09-30: qty 90, 90d supply, fill #1
  Filled 2022-12-27: qty 90, 90d supply, fill #2

## 2022-07-05 NOTE — Progress Notes (Signed)
Electrophysiology Office Note   Date:  07/05/2022   ID:  Anthony Cole, Anthony Cole Oct 20, 1950, MRN [id number]  PCP:  Lawerance Cruel, MD  Cardiologist:  [facility] Primary Electrophysiologist:  Paddy Neis Meredith Leeds, MD    Chief Complaint: AF   History of Present Illness: Anthony Cole is a 72 y.o. male who is being seen today for the evaluation of AF at the request of Belva Crome, MD. Presenting today for electrophysiology evaluation.  He has a history significant for atrial fibrillation, type 2 diabetes, hypertension, hyperlipidemia, OSA on CPAP.  He is on dofetilide.  He is status post ablation in 2019.  He unfortunately has been in atrial fibrillation for potentially a year.  He is quite concerned.  He feels weak and fatigued at times, but otherwise feels well.  He does have concerns about ablation as he is worried about the risks.  Despite that, he would like to get back into normal rhythm.  Today, he denies symptoms of palpitations, chest pain, shortness of breath, orthopnea, PND, lower extremity edema, claudication, dizziness, presyncope, syncope, bleeding, or neurologic sequela. The patient is tolerating medications without difficulties.    Past Medical History:  Diagnosis Date   Arthritis    Atrial fibrillation ([place])    Diabetes mellitus without complication ([place])    type 2   Hyperlipidemia    Hypertension    Hypothyroidism    Obesity (BMI 30-39.9) 01/25/2016   OSA on CPAP 07/16/2014   Moderate with AHI 19/hr on CPAP at 8cm H2O   Visit for monitoring Tikosyn therapy 10/25/2017   Past Surgical History:  Procedure Laterality Date   ATRIAL FIBRILLATION ABLATION N/A 08/25/2017   Procedure: ATRIAL FIBRILLATION ABLATION;  Surgeon: Thompson Grayer, MD;  Location: [place] [facility];  Service: Cardiovascular;  Laterality: N/A;   CARDIOVERSION  11/04/2011   Procedure: CARDIOVERSION;  Surgeon: Sinclair Grooms, MD;  Location: [facility];  Service: Cardiovascular;   Laterality: N/A;   CARDIOVERSION N/A 07/02/2013   Procedure: CARDIOVERSION;  Surgeon: Sinclair Grooms, MD;  Location: [facility];  Service: Cardiovascular;  Laterality: N/A;  10:07 elective cardioversion Lido 40mg , Propofol 60mg ,IV...200 joules, synched, pt presently in A flutter,...cardioverted to NSR...   CARDIOVERSION N/A 09/19/2013   Procedure: CARDIOVERSION;  Surgeon: Sinclair Grooms, MD;  Location: [facility];  Service: Cardiovascular;  Laterality: N/A;   CARDIOVERSION N/A 08/13/2014   Procedure: CARDIOVERSION;  Surgeon: Dorothy Spark, MD;  Location: [facility];  Service: Cardiovascular;  Laterality: N/A;   CARDIOVERSION N/A 05/23/2017   Procedure: CARDIOVERSION;  Surgeon: Acie Fredrickson Wonda Cheng, MD;  Location: [facility];  Service: Cardiovascular;  Laterality: N/A;   CARDIOVERSION N/A 09/09/2017   Procedure: CARDIOVERSION;  Surgeon: Fay Records, MD;  Location: [facility];  Service: Cardiovascular;  Laterality: N/A;   CARDIOVERSION N/A 09/30/2017   Procedure: CARDIOVERSION;  Surgeon: Herminio Commons, MD;  Location: [facility];  Service: Cardiovascular;  Laterality: N/A;   EYE SURGERY     FINGER FRACTURE SURGERY Left "when I was a kid"   FRACTURE SURGERY     KNEE ARTHROSCOPY W/ DEBRIDEMENT Bilateral    RETINAL LASER PROCEDURE Bilateral    "tears on both; not detachments"   SHOULDER ARTHROSCOPY W/ ROTATOR CUFF REPAIR Bilateral    STAPEDES SURGERY Right    TONSILLECTOMY     TYMPANOPLASTY Bilateral    "skin grafts to ear drums"     Current Outpatient Medications  Medication Sig Dispense Refill   ACCU-CHEK  GUIDE test strip CHECK BLOOD SUGAR ONCE A DAY. 50     Accu-Chek Softclix Lancets lancets Use to check blood glucose twice daily. 100 each 4   acyclovir (ZOVIRAX) 200 MG capsule Take 200 mg by mouth 2 (two) times daily as needed (first sign of fever blister).      Bevacizumab (AVASTIN IV) Inject into the vein every 30 (thirty) days. Eye Injection     Blood Glucose  Monitoring Suppl (ACCU-CHEK GUIDE) w/Device KIT 1 each as directed     Cholecalciferol 50 MCG (2000 UT) TABS Take 2,000 Units by mouth daily.     ciprofloxacin (CILOXAN) 0.3 % ophthalmic solution INSTILL ONE DROP INTO LEFT EYE 4 TIMES A DAY FOR 2 DAYS AFTER EACH MONTHLY EYE INJECTION 2.5 mL 12   COVID-19 mRNA vaccine 2023-2024 (COMIRNATY) syringe Inject into the muscle. 0.3 mL 0   dofetilide (TIKOSYN) 500 MCG capsule TAKE 1 CAPSULE BY MOUTH TWICE DAILY. 180 capsule 3   doxycycline (VIBRA-TABS) 100 MG tablet Take 1 tablet (100 mg total) by mouth 2 (two) times daily. (Patient taking differently: Take 100 mg by mouth daily. May take an additional dose if needed for rosacea) 180 tablet 4   Dulaglutide (TRULICITY) 0.75 MG/0.5ML SOPN Inject 0.75 mg into the skin once a week. 6 mL 4   empagliflozin (JARDIANCE) 10 MG TABS tablet Take 1 tablet (10 mg total) by mouth daily. 90 tablet 4   empagliflozin (JARDIANCE) 10 MG TABS tablet Take 1 tablet (10 mg total) by mouth daily. 30 tablet 12   glucose blood (ACCU-CHEK GUIDE) test strip Use to test blood sugars twice daily. 100 each 4   levothyroxine (SYNTHROID) 112 MCG tablet Take 112 mcg by mouth every morning.     losartan (COZAAR) 100 MG tablet Take 1 tablet (100 mg total) by mouth daily. 90 tablet 3   metoprolol succinate (TOPROL-XL) 50 MG 24 hr tablet Take 1 tablet (50 mg total) by mouth daily. Take with or immediately following a meal. 90 tablet 3   Multiple Vitamins-Minerals (PRESERVISION AREDS 2) CAPS Take 2 capsules by mouth daily. 120 capsule 5   Multiple Vitamins-Minerals (PRESERVISION AREDS 2+MULTI VIT PO) Take 1 tablet by mouth in the morning and at bedtime.     potassium chloride SA (KLOR-CON M) 20 MEQ tablet Take 1 tablet (20 mEq total) by mouth daily. 90 tablet 3   rivaroxaban (XARELTO) 20 MG TABS tablet Take 1 tablet (20 mg total) by mouth daily 90 tablet 3   rosuvastatin (CRESTOR) 40 MG tablet Take 1 tablet (40 mg total) by mouth daily. 90 tablet  3   spironolactone (ALDACTONE) 25 MG tablet Take 1 tablet (25 mg total) by mouth daily. 90 tablet 2   traMADol (ULTRAM) 50 MG tablet Take 1 tablet (50 mg total) by mouth every 6 (six) hours as needed. 10 tablet 0   Current Facility-Administered Medications  Medication Dose Route Frequency Provider Last Rate Last Admin   0.9 %  sodium chloride infusion  250 mL Intravenous Continuous [name], MD       sodium chloride flush (NS) 0.9 % injection 3 mL  3 mL Intravenous Q12H [name], MD       sodium chloride flush (NS) 0.9 % injection 3 mL  3 mL Intravenous PRN [name], MD        Allergies:   Gabapentin, Other, Androgel [testosterone], Flomax [tamsulosin hcl], and Lipitor [atorvastatin]   Social History:  The patient  reports that  he has never smoked. He has never used smokeless tobacco. He reports that he does not drink alcohol and does not use drugs.   Family History:  The patient's family history includes Hypertension in his mother; Stroke in his brother and maternal grandmother.    ROS:  Please see the history of present illness.   Otherwise, review of systems is positive for none.   All other systems are reviewed and negative.    PHYSICAL EXAM: VS:  BP 138/80   Pulse 89   Ht 5\' 9"  (1.753 m)   Wt 157 lb (71.2 kg)   SpO2 99%   BMI 23.18 kg/m  , BMI Body mass index is 23.18 kg/m. GEN: Well nourished, well developed, in no acute distress  HEENT: normal  Neck: no JVD, carotid bruits, or masses Cardiac: irregualr; no murmurs, rubs, or gallops,no edema  Respiratory:  clear to auscultation bilaterally, normal work of breathing GI: soft, nontender, nondistended, + BS MS: no deformity or atrophy  Skin: warm and dry Neuro:  Strength and sensation are intact Psych: euthymic mood, full affect  EKG:  EKG is not ordered today. Personal review of the ekg ordered 06/26/22 shows AF, rate 80  Recent Labs: 10/15/2021: Magnesium 2.3 06/15/2022: BUN 28; Potassium 5.0; Sodium  138 06/25/2022: Creatinine, Ser 0.88; Hemoglobin 14.1; Platelets 205    Lipid Panel     Component Value Date/Time   CHOL 131 08/10/2018 0926   TRIG 91 08/10/2018 0926   HDL 39 (L) 08/10/2018 0926   CHOLHDL 3.4 08/10/2018 0926   LDLCALC 74 08/10/2018 0926     Wt Readings from Last 3 Encounters:  07/05/22 157 lb (71.2 kg)  06/25/22 159 lb (72.1 kg)  06/15/22 159 lb (72.1 kg)      Other studies Reviewed: Additional studies/ records that were reviewed today include: TTE 09/24/20  Review of the above records today demonstrates:   1. Left ventricular ejection fraction, by estimation, is 60 to 65%. The  left ventricle has normal function. The left ventricle has no regional  wall motion abnormalities. There is moderate concentric left ventricular  hypertrophy. Left ventricular  diastolic function could not be evaluated.   2. Right ventricular systolic function is normal. The right ventricular  size is normal. Tricuspid regurgitation signal is inadequate for assessing  PA pressure.   3. Left atrial size was severely dilated.   4. Right atrial size was severely dilated.   5. The mitral valve is normal in structure. No evidence of mitral valve  regurgitation.   6. The aortic valve is tricuspid. Aortic valve regurgitation is not  visualized. Mild aortic valve sclerosis is present, with no evidence of  aortic valve stenosis.   7. Aortic dilatation noted. There is mild dilatation of the aortic root,  measuring 41 mm. There is borderline dilatation of the ascending aorta,  measuring 37 mm.   8. The inferior vena cava is dilated in size with <50% respiratory  variability, suggesting right atrial pressure of 15 mmHg.    ASSESSMENT AND PLAN:  1.  Persistent atrial fibrillation: CHA2DS2-VASc of 3.  Currently on Xarelto.  I feel that the only way to get him back into rhythm is with a repeat ablation.  He was already on dofetilide.  I do not see much benefit in switching him to amiodarone.   Additionally at his age, would prefer to avoid amiodarone if possible.  I talked about endocardial ablation versus hybrid ablation.  Abrie Egloff refer him to cardiac surgery to  further discuss hybrid ablation.  2.  Hypertension: Currently well-controlled  3.  Obstructive sleep apnea: CPAP compliance encouraged  4.  Secondary hypercoagulable state: Currently on Xarelto for atrial fibrillation as above  5.  High risk medication monitoring: Currently on dofetilide.  QTc and recent labs without abnormality.  Filbert Craze check magnesium today.  Current medicines are reviewed at length with the patient today.   The patient does not have concerns regarding his medicines.  The following changes were made today:  none  Labs/ tests ordered today include:  Orders Placed This Encounter  Procedures   Magnesium   Ambulatory referral to Cardiothoracic Surgery     Disposition:   FU with Kalayla Shadden 6 months  Signed, [place] Meredith Leeds, MD  07/05/2022 3:27 PM     [place] [address] [place] [address] [phone] (office) [phone] (fax)

## 2022-07-05 NOTE — Patient Instructions (Signed)
Medication Instructions:  Your physician recommends that you continue on your current medications as directed. Please refer to the Current Medication list given to you today.  *If you need a refill on your cardiac medications before your next appointment, please call your pharmacy*   Lab Work: Tikosyn surveillance lab today:  Magnesium level  If you have labs (blood work) drawn today and your tests are completely normal, you will receive your results only by: MyChart Message (if you have MyChart) OR A paper copy in the mail If you have any lab test that is abnormal or we need to change your treatment, we will call you to review the results.   Testing/Procedures: None ordered   Follow-Up: At [facility], you and your health needs are our priority.  As part of our continuing mission to provide you with exceptional heart care, we have created designated Provider Care Teams.  These Care Teams include your primary Cardiologist (physician) and Advanced Practice Providers (APPs -  Physician Assistants and Nurse Practitioners) who all work together to provide you with the care you need, when you need it.    You have been referred to Dr. Tenny Craw to discuss hybrid ablation.   Your next appointment:   6 month(s)  The format for your next appointment:   In Person  Provider:   Allegra Lai, MD{   Thank you for choosing [facility]!!   Trinidad Curet, RN [phone]

## 2022-07-06 ENCOUNTER — Other Ambulatory Visit (HOSPITAL_COMMUNITY): Payer: Self-pay

## 2022-07-06 ENCOUNTER — Other Ambulatory Visit: Payer: Self-pay

## 2022-07-06 DIAGNOSIS — E119 Type 2 diabetes mellitus without complications: Secondary | ICD-10-CM | POA: Diagnosis not present

## 2022-07-06 DIAGNOSIS — E11319 Type 2 diabetes mellitus with unspecified diabetic retinopathy without macular edema: Secondary | ICD-10-CM | POA: Diagnosis not present

## 2022-07-06 DIAGNOSIS — E063 Autoimmune thyroiditis: Secondary | ICD-10-CM | POA: Diagnosis not present

## 2022-07-06 DIAGNOSIS — E039 Hypothyroidism, unspecified: Secondary | ICD-10-CM | POA: Diagnosis not present

## 2022-07-06 DIAGNOSIS — I6930 Unspecified sequelae of cerebral infarction: Secondary | ICD-10-CM | POA: Diagnosis not present

## 2022-07-06 LAB — MAGNESIUM: Magnesium: 2.3 mg/dL (ref 1.6–2.3)

## 2022-07-07 ENCOUNTER — Other Ambulatory Visit (HOSPITAL_COMMUNITY): Payer: Self-pay

## 2022-07-07 DIAGNOSIS — I480 Paroxysmal atrial fibrillation: Secondary | ICD-10-CM | POA: Diagnosis not present

## 2022-07-07 DIAGNOSIS — I700 Atherosclerosis of aorta: Secondary | ICD-10-CM | POA: Diagnosis not present

## 2022-07-07 DIAGNOSIS — Z6822 Body mass index (BMI) 22.0-22.9, adult: Secondary | ICD-10-CM | POA: Diagnosis not present

## 2022-07-07 DIAGNOSIS — E11319 Type 2 diabetes mellitus with unspecified diabetic retinopathy without macular edema: Secondary | ICD-10-CM | POA: Diagnosis not present

## 2022-07-07 DIAGNOSIS — Z125 Encounter for screening for malignant neoplasm of prostate: Secondary | ICD-10-CM | POA: Diagnosis not present

## 2022-07-07 DIAGNOSIS — D6869 Other thrombophilia: Secondary | ICD-10-CM | POA: Diagnosis not present

## 2022-07-07 DIAGNOSIS — E7800 Pure hypercholesterolemia, unspecified: Secondary | ICD-10-CM | POA: Diagnosis not present

## 2022-07-08 ENCOUNTER — Encounter (INDEPENDENT_AMBULATORY_CARE_PROVIDER_SITE_OTHER): Payer: Medicare PPO | Admitting: Ophthalmology

## 2022-07-08 DIAGNOSIS — I10 Essential (primary) hypertension: Secondary | ICD-10-CM

## 2022-07-08 DIAGNOSIS — H35033 Hypertensive retinopathy, bilateral: Secondary | ICD-10-CM | POA: Diagnosis not present

## 2022-07-08 DIAGNOSIS — E113291 Type 2 diabetes mellitus with mild nonproliferative diabetic retinopathy without macular edema, right eye: Secondary | ICD-10-CM | POA: Diagnosis not present

## 2022-07-08 DIAGNOSIS — H35372 Puckering of macula, left eye: Secondary | ICD-10-CM

## 2022-07-08 DIAGNOSIS — H353111 Nonexudative age-related macular degeneration, right eye, early dry stage: Secondary | ICD-10-CM | POA: Diagnosis not present

## 2022-07-08 DIAGNOSIS — E113312 Type 2 diabetes mellitus with moderate nonproliferative diabetic retinopathy with macular edema, left eye: Secondary | ICD-10-CM | POA: Diagnosis not present

## 2022-07-08 DIAGNOSIS — H33303 Unspecified retinal break, bilateral: Secondary | ICD-10-CM | POA: Diagnosis not present

## 2022-07-08 DIAGNOSIS — H43813 Vitreous degeneration, bilateral: Secondary | ICD-10-CM | POA: Diagnosis not present

## 2022-07-09 ENCOUNTER — Other Ambulatory Visit: Payer: Self-pay

## 2022-07-14 DIAGNOSIS — I480 Paroxysmal atrial fibrillation: Secondary | ICD-10-CM | POA: Diagnosis not present

## 2022-07-14 DIAGNOSIS — E039 Hypothyroidism, unspecified: Secondary | ICD-10-CM | POA: Diagnosis not present

## 2022-07-14 DIAGNOSIS — I10 Essential (primary) hypertension: Secondary | ICD-10-CM | POA: Diagnosis not present

## 2022-07-14 DIAGNOSIS — E11319 Type 2 diabetes mellitus with unspecified diabetic retinopathy without macular edema: Secondary | ICD-10-CM | POA: Diagnosis not present

## 2022-07-14 DIAGNOSIS — E7800 Pure hypercholesterolemia, unspecified: Secondary | ICD-10-CM | POA: Diagnosis not present

## 2022-07-15 ENCOUNTER — Encounter (HOSPITAL_COMMUNITY): Payer: Self-pay | Admitting: *Deleted

## 2022-07-16 DIAGNOSIS — Z5181 Encounter for therapeutic drug level monitoring: Secondary | ICD-10-CM | POA: Diagnosis not present

## 2022-07-16 DIAGNOSIS — I4891 Unspecified atrial fibrillation: Secondary | ICD-10-CM | POA: Diagnosis not present

## 2022-07-16 DIAGNOSIS — Z7901 Long term (current) use of anticoagulants: Secondary | ICD-10-CM | POA: Diagnosis not present

## 2022-07-16 DIAGNOSIS — Z79899 Other long term (current) drug therapy: Secondary | ICD-10-CM | POA: Diagnosis not present

## 2022-07-18 NOTE — Progress Notes (Signed)
[address]       [place] [address]             [phone]                    Crosley B Clare [place] Medical Record [id number] Date of Birth: 09-05-1950  Referring: Lawerance Cruel, MD Primary Care: Lawerance Cruel, MD Primary Cardiologist: Sinclair Grooms, MD (Inactive)  Chief Complaint:   No chief complaint on file.   History of Present Illness:    Anthony Cole 72 y.o. male who presents for evaluation of convergent procedure for atrial fibrillation. He has a history of ablation and multiple cardioversions. Our conversation ranged different subjects, but at times he did not endorse being symptomatic from AF.   Per recent cardiology hpi:  Anthony Cole is a 72 y.o. male who is being seen today for the evaluation of AF at the request of Belva Crome, MD. Presenting today for electrophysiology evaluation.   He has a history significant for atrial fibrillation, type 2 diabetes, hypertension, hyperlipidemia, OSA on CPAP.  He is on dofetilide.  He is status post ablation in 2019.   He unfortunately has been in atrial fibrillation for potentially a year.  He is quite concerned.  He feels weak and fatigued at times, but otherwise feels well.  He does have concerns about ablation as he is worried about the risks.  Despite that, he would like to get back into normal rhythm.   Today, he denies symptoms of palpitations, chest pain, shortness of breath, orthopnea, PND, lower extremity edema, claudication, dizziness, presyncope, syncope, bleeding, or neurologic sequela. The patient is tolerating medications without difficulties.        Past Medical History:  Diagnosis Date   Arthritis    Atrial fibrillation ([place])    Diabetes mellitus without complication ([place])    type 2   Hyperlipidemia    Hypertension    Hypothyroidism    Obesity (BMI 30-39.9) 01/25/2016   OSA on CPAP 07/16/2014   Moderate with AHI 19/hr on CPAP at 8cm H2O   Visit  for monitoring Tikosyn therapy 10/25/2017    Past Surgical History:  Procedure Laterality Date   ATRIAL FIBRILLATION ABLATION N/A 08/25/2017   Procedure: ATRIAL FIBRILLATION ABLATION;  Surgeon: Thompson Grayer, MD;  Location: [place] [facility];  Service: Cardiovascular;  Laterality: N/A;   CARDIOVERSION  11/04/2011   Procedure: CARDIOVERSION;  Surgeon: Sinclair Grooms, MD;  Location: [place];  Service: Cardiovascular;  Laterality: N/A;   CARDIOVERSION N/A 07/02/2013   Procedure: CARDIOVERSION;  Surgeon: Sinclair Grooms, MD;  Location: [facility] ENDOSCOPY;  Service: Cardiovascular;  Laterality: N/A;  10:07 elective cardioversion Lido '40mg'$ , Propofol '60mg'$ ,IV...200 joules, synched, pt presently in A flutter,...cardioverted to NSR...   CARDIOVERSION N/A 09/19/2013   Procedure: CARDIOVERSION;  Surgeon: Sinclair Grooms, MD;  Location[facility] ENDOSCOPY;  Service: Cardiovascular;  Laterality: N/A;   CARDIOVERSION N/A 08/13/2014   Procedure: CARDIOVERSION;  Surgeon: Dorothy Spark, MD;  Location[facility] ENDOSCOPY;  Service: Cardiovascular;  Laterality: N/A;   CARDIOVERSION N/A 05/23/2017   Procedure: CARDIOVERSION;  Surgeon: Acie Fredrickson Wonda Cheng, MD;  Location: [facility] ENDOSCOPY;  Service: Cardiovascular;  Laterality: N/A;   CARDIOVERSION N/A 09/09/2017   Procedure: CARDIOVERSION;  Surgeon: Fay Records, MD;  Location: [facility] ENDOSCOPY;  Service: Cardiovascular;  Laterality: N/A;   CARDIOVERSION N/A 09/30/2017   Procedure: CARDIOVERSION;  Surgeon: Bronson Ing,  Lenise Herald, MD;  Location: [place] ENDOSCOPY;  Service: Cardiovascular;  Laterality: N/A;   EYE SURGERY     FINGER FRACTURE SURGERY Left "when I was a kid"   FRACTURE SURGERY     KNEE ARTHROSCOPY W/ DEBRIDEMENT Bilateral    RETINAL LASER PROCEDURE Bilateral    "tears on both; not detachments"   SHOULDER ARTHROSCOPY W/ ROTATOR CUFF REPAIR Bilateral    STAPEDES SURGERY Right    TONSILLECTOMY     TYMPANOPLASTY Bilateral    "skin grafts to ear drums"    Family History  Problem  Relation Age of Onset   Hypertension Mother    Stroke Brother    Stroke Maternal Grandmother      Social History   Tobacco Use  Smoking Status Never  Smokeless Tobacco Never    Social History   Substance and Sexual Activity  Alcohol Use No     Allergies  Allergen Reactions   Gabapentin Other (See Comments)    Causes Extreme sedation (Drowsy)    Other     Not sure of name of med-for overactive bladder-caused some adverse reactions, stopped taking immediately-per patient   Androgel [Testosterone]     Pt preference    Flomax [Tamsulosin Hcl] Other (See Comments)    drowsiness   Lipitor [Atorvastatin] Other (See Comments)    Responds better with Crestor     Current Outpatient Medications  Medication Sig Dispense Refill   ACCU-CHEK GUIDE test strip CHECK BLOOD SUGAR ONCE A DAY. 50     Accu-Chek Softclix Lancets lancets Use to check blood glucose twice daily. 100 each 4   acyclovir (ZOVIRAX) 200 MG capsule Take 200 mg by mouth 2 (two) times daily as needed (first sign of fever blister).      Bevacizumab (AVASTIN IV) Inject into the vein every 30 (thirty) days. Eye Injection     Blood Glucose Monitoring Suppl (ACCU-CHEK GUIDE) w/Device KIT 1 each as directed     Cholecalciferol 50 MCG (2000 UT) TABS Take 2,000 Units by mouth daily.     ciprofloxacin (CILOXAN) 0.3 % ophthalmic solution INSTILL ONE DROP INTO LEFT EYE 4 TIMES A DAY FOR 2 DAYS AFTER EACH MONTHLY EYE INJECTION 2.5 mL 12   COVID-19 mRNA vaccine 2023-2024 (COMIRNATY) syringe Inject into the muscle. 0.3 mL 0   dofetilide (TIKOSYN) 500 MCG capsule TAKE 1 CAPSULE BY MOUTH TWICE DAILY. 180 capsule 3   doxycycline (VIBRA-TABS) 100 MG tablet Take 1 tablet (100 mg total) by mouth 2 (two) times daily. (Patient taking differently: Take 100 mg by mouth daily. May take an additional dose if needed for rosacea) 180 tablet 4   Dulaglutide (TRULICITY) A999333 0000000 SOPN Inject 0.75 mg into the skin once a week. 6 mL 4    empagliflozin (JARDIANCE) 10 MG TABS tablet Take 1 tablet (10 mg total) by mouth daily. 90 tablet 4   empagliflozin (JARDIANCE) 10 MG TABS tablet Take 1 tablet (10 mg total) by mouth daily. 30 tablet 12   glucose blood (ACCU-CHEK GUIDE) test strip Use to test blood sugars twice daily. 100 each 4   levothyroxine (SYNTHROID) 112 MCG tablet Take 112 mcg by mouth every morning.     levothyroxine (SYNTHROID) 112 MCG tablet Take 1 tablet (112 mcg total) by mouth daily on an empty stomach. 90 tablet 2   losartan (COZAAR) 100 MG tablet Take 1 tablet (100 mg total) by mouth daily. 90 tablet 3   metoprolol succinate (TOPROL-XL) 50 MG 24 hr tablet Take 1  tablet (50 mg total) by mouth daily. Take with or immediately following a meal. 90 tablet 3   Multiple Vitamins-Minerals (PRESERVISION AREDS 2) CAPS Take 2 capsules by mouth daily. 120 capsule 5   Multiple Vitamins-Minerals (PRESERVISION AREDS 2+MULTI VIT PO) Take 1 tablet by mouth in the morning and at bedtime.     potassium chloride SA (KLOR-CON M) 20 MEQ tablet Take 1 tablet (20 mEq total) by mouth daily. 90 tablet 3   rivaroxaban (XARELTO) 20 MG TABS tablet Take 1 tablet (20 mg total) by mouth daily 90 tablet 3   rosuvastatin (CRESTOR) 40 MG tablet Take 1 tablet (40 mg total) by mouth daily. 90 tablet 3   spironolactone (ALDACTONE) 25 MG tablet Take 1 tablet (25 mg total) by mouth daily. 90 tablet 2   traMADol (ULTRAM) 50 MG tablet Take 1 tablet (50 mg total) by mouth every 6 (six) hours as needed. 10 tablet 0   Current Facility-Administered Medications  Medication Dose Route Frequency Provider Last Rate Last Admin   0.9 %  sodium chloride infusion  250 mL Intravenous Continuous Belva Crome, MD       sodium chloride flush (NS) 0.9 % injection 3 mL  3 mL Intravenous Q12H Belva Crome, MD       sodium chloride flush (NS) 0.9 % injection 3 mL  3 mL Intravenous PRN Belva Crome, MD        ROS 14 point ROS reviewed and negative except as per  HPI   PHYSICAL EXAMINATION: There were no vitals taken for this visit.  Gen: NAD Neuro: Alert and oriented Resp: Nonlaboured Abd: Soft, ntnd Extr: WWP  Diagnostic Studies & Laboratory data:     Recent Radiology Findings:   No results found.     I have independently reviewed the above radiology studies  and reviewed the findings with the patient.   Recent Lab Findings: Lab Results  Component Value Date   WBC 10.8 (H) 06/25/2022   HGB 14.1 06/25/2022   HCT 43.2 06/25/2022   PLT 205 06/25/2022   GLUCOSE 105 (H) 06/15/2022   CHOL 131 08/10/2018   TRIG 91 08/10/2018   HDL 39 (L) 08/10/2018   LDLCALC 74 08/10/2018   ALT 31 08/10/2018   AST 29 08/10/2018   NA 138 06/15/2022   K 5.0 06/15/2022   CL 104 06/15/2022   CREATININE 0.88 06/25/2022   BUN 28 (H) 06/15/2022   CO2 27 06/15/2022   TSH 4.183 09/22/2017   INR 1.4 (H) 05/18/2017   HGBA1C 5.7 (H) 06/15/2022     Assessment / Plan:   Anthony Cole 73 y.o. male who presents for evaluation of convergent procedure for atrial fibrillation   We conversed regarding a range of subjects. He was seeing Pernell Dupre before he retired. In particular, the patient mentioned at times he was not symptomatic from AF, so why would we treat it.  He essentially endorses he can do what he was doing five years ago.  By echo he has a normal EF.  I think it would be good for him to decide with finality if he has symptoms of shortness of breath or fatigue.  Our conversation was wide-ranging, and I touched on the salient points of AF care including medications, ablation, and a hybrid ablation strategy. We did not get into all the details due to time constraints.   I hope to see him again in 1-2 months to further discuss. Symptomatology may be more clear  at that time.    I  spent 60 minutes with  the patient face to face in counseling and coordination of care.    Pierre Bali Robley [place] 07/18/2022 5:21 PM

## 2022-07-20 ENCOUNTER — Other Ambulatory Visit (HOSPITAL_COMMUNITY): Payer: Self-pay

## 2022-07-21 ENCOUNTER — Other Ambulatory Visit (HOSPITAL_COMMUNITY): Payer: Self-pay

## 2022-07-22 ENCOUNTER — Institutional Professional Consult (permissible substitution): Payer: Medicare PPO | Admitting: Cardiothoracic Surgery

## 2022-07-22 ENCOUNTER — Other Ambulatory Visit (HOSPITAL_COMMUNITY): Payer: Self-pay

## 2022-07-22 ENCOUNTER — Encounter: Payer: Self-pay | Admitting: Cardiothoracic Surgery

## 2022-07-22 VITALS — BP 125/72 | HR 75 | Resp 20 | Ht 69.0 in | Wt 166.0 lb

## 2022-07-22 DIAGNOSIS — I4819 Other persistent atrial fibrillation: Secondary | ICD-10-CM

## 2022-07-23 ENCOUNTER — Ambulatory Visit: Payer: Medicare PPO | Attending: Cardiology | Admitting: Cardiology

## 2022-07-23 ENCOUNTER — Encounter: Payer: Self-pay | Admitting: Cardiology

## 2022-07-23 VITALS — BP 130/68 | HR 80 | Ht 69.0 in | Wt 161.6 lb

## 2022-07-23 DIAGNOSIS — I10 Essential (primary) hypertension: Secondary | ICD-10-CM

## 2022-07-23 DIAGNOSIS — G4733 Obstructive sleep apnea (adult) (pediatric): Secondary | ICD-10-CM

## 2022-07-23 NOTE — Patient Instructions (Signed)
Medication Instructions:  Your physician recommends that you continue on your current medications as directed. Please refer to the Current Medication list given to you today.  *If you need a refill on your cardiac medications before your next appointment, please call your pharmacy*   Lab Work: None.  If you have labs (blood work) drawn today and your tests are completely normal, you will receive your results only by: [place] (if you have MyChart) OR A paper copy in the mail If you have any lab test that is abnormal or we need to change your treatment, we will call you to review the results.   Testing/Procedures: None.   Follow-Up:  Your next appointment:   1 year(s)  Provider:   Dr. Fransico Him, MD

## 2022-07-23 NOTE — Progress Notes (Addendum)
Cardiology Office Note:    Date:  07/23/2022   ID:  Anthony Cole, DOB [birth year], MRN [id number]  PCP:  Lawerance Cruel, MD  Cardiologist:  Daneen Schick, MD   Referring MD: Lawerance Cruel, MD   Chief Complaint  Patient presents with   Sleep Apnea    History of Present Illness:    Anthony Cole is a 72 y.o. male with a hx of moderate OSA with an AHI of 19 events per hour.  He is on CPAP at 8cm H2O. He is doing well with his PAP device and thinks that he has gotten used to it.  He tolerates the mask and feels the pressure is adequate.  Since going on PAP he feels rested in the am and has no significant daytime sleepiness.  He denies any significant mouth or nasal dryness or nasal congestion.  He does not think that he snores.    Past Medical History:  Diagnosis Date   Arthritis    Atrial fibrillation ([place])    Diabetes mellitus without complication ([place])    type 2   Hyperlipidemia    Hypertension    Hypothyroidism    Obesity (BMI 30-39.9) 01/25/2016   OSA on CPAP 07/16/2014   Moderate with AHI 19/hr on CPAP at 8cm H2O   Visit for monitoring Tikosyn therapy 10/25/2017    Past Surgical History:  Procedure Laterality Date   ATRIAL FIBRILLATION ABLATION N/A 08/25/2017   Procedure: ATRIAL FIBRILLATION ABLATION;  Surgeon: Thompson Grayer, MD;  Location: [place] [facility];  Service: Cardiovascular;  Laterality: N/A;   CARDIOVERSION  11/04/2011   Procedure: CARDIOVERSION;  Surgeon: Sinclair Grooms, MD;  Location: [place];  Service: Cardiovascular;  Laterality: N/A;   CARDIOVERSION N/A 07/02/2013   Procedure: CARDIOVERSION;  Surgeon: Sinclair Grooms, MD;  Location: [facility] ENDOSCOPY;  Service: Cardiovascular;  Laterality: N/A;  10:07 elective cardioversion Lido 7m, Propofol 63mIV...200 joules, synched, pt presently in A flutter,...cardioverted to NSR...   CARDIOVERSION N/A 09/19/2013   Procedure: CARDIOVERSION;  Surgeon: HeSinclair GroomsMD;  [place] [facility]  Service: Cardiovascular;  Laterality: N/A;   CARDIOVERSION N/A 08/13/2014   Procedure: CARDIOVERSION;  Surgeon: KaDorothy SparkMD;  [facility] Service: Cardiovascular;  Laterality: N/A;   CARDIOVERSION N/A 05/23/2017   Procedure: CARDIOVERSION;  Surgeon: NaAcie FredricksonhWonda ChengMD;  [facility] [facility]: [facility];  Laterality: N/A;   CARDIOVERSION N/A 09/09/2017   Procedure: CARDIOVERSION;  Surgeon: RoFay RecordsMD;  [place] [facility] Service: Cardiovascular;  Laterality: N/A;   CARDIOVERSION N/A 09/30/2017   Procedure: CARDIOVERSION;  Surgeon: KoHerminio CommonsMD;  [facility];  Service: Cardiovascular;  Laterality: N/A;   EYE SURGERY     FINGER FRACTURE SURGERY Left "when I was a kid"   FRACTURE SURGERY     KNEE ARTHROSCOPY W/ DEBRIDEMENT Bilateral    RETINAL LASER PROCEDURE Bilateral    "tears on both; not detachments"   SHOULDER ARTHROSCOPY W/ ROTATOR CUFF REPAIR Bilateral    STAPEDES SURGERY Right    TONSILLECTOMY     TYMPANOPLASTY Bilateral    "skin grafts to ear drums"    Current Medications: Current Meds  Medication Sig   ACCU-CHEK GUIDE test strip CHECK BLOOD SUGAR ONCE A DAY. 50   Accu-Chek Softclix Lancets lancets Use to check blood glucose twice daily.   acyclovir (ZOVIRAX) 200 MG capsule Take 200 mg by mouth 2 (two) times daily as needed (  first sign of fever blister).    Bevacizumab (AVASTIN IV) Inject into the vein every 30 (thirty) days. Eye Injection   Blood Glucose Monitoring Suppl (ACCU-CHEK GUIDE) w/Device KIT 1 each as directed   Cholecalciferol 50 MCG (2000 UT) TABS Take 2,000 Units by mouth daily.   ciprofloxacin (CILOXAN) 0.3 % ophthalmic solution INSTILL ONE DROP INTO LEFT EYE 4 TIMES A DAY FOR 2 DAYS AFTER EACH MONTHLY EYE INJECTION   dofetilide (TIKOSYN) 500 MCG capsule TAKE 1 CAPSULE BY MOUTH TWICE DAILY.   doxycycline (VIBRA-TABS) 100 MG tablet Take 1 tablet (100 mg total) by mouth 2 (two) times daily.    Dulaglutide (TRULICITY) A999333 0000000 SOPN Inject 0.75 mg into the skin once a week.   empagliflozin (JARDIANCE) 10 MG TABS tablet Take 1 tablet (10 mg total) by mouth daily.   empagliflozin (JARDIANCE) 10 MG TABS tablet Take 1 tablet (10 mg total) by mouth daily.   glucose blood (ACCU-CHEK GUIDE) test strip Use to test blood sugars twice daily.   levothyroxine (SYNTHROID) 112 MCG tablet Take 112 mcg by mouth every morning.   levothyroxine (SYNTHROID) 112 MCG tablet Take 1 tablet (112 mcg total) by mouth daily on an empty stomach.   losartan (COZAAR) 100 MG tablet Take 1 tablet (100 mg total) by mouth daily.   metoprolol succinate (TOPROL-XL) 50 MG 24 hr tablet Take 1 tablet (50 mg total) by mouth daily. Take with or immediately following a meal.   Multiple Vitamins-Minerals (PRESERVISION AREDS 2) CAPS Take 2 capsules by mouth daily.   Multiple Vitamins-Minerals (PRESERVISION AREDS 2+MULTI VIT PO) Take 1 tablet by mouth in the morning and at bedtime.   potassium chloride SA (KLOR-CON M) 20 MEQ tablet Take 1 tablet (20 mEq total) by mouth daily.   rivaroxaban (XARELTO) 20 MG TABS tablet Take 1 tablet (20 mg total) by mouth daily   rosuvastatin (CRESTOR) 40 MG tablet Take 1 tablet (40 mg total) by mouth daily.   spironolactone (ALDACTONE) 25 MG tablet Take 1 tablet (25 mg total) by mouth daily.   traMADol (ULTRAM) 50 MG tablet Take 1 tablet (50 mg total) by mouth every 6 (six) hours as needed.   Current Facility-Administered Medications for the 07/23/22 encounter (Office Visit) with Sueanne Margarita, MD  Medication   0.9 %  sodium chloride infusion   sodium chloride flush (NS) 0.9 % injection 3 mL   sodium chloride flush (NS) 0.9 % injection 3 mL     Allergies:   Gabapentin, Other, Androgel [testosterone], Flomax [tamsulosin hcl], and Lipitor [atorvastatin]   Social History   Socioeconomic History   Marital status: Single    Spouse name: Not on file   Number of children: Not on file    Years of education: Not on file   Highest education level: Not on file  Occupational History   Not on file  Tobacco Use   Smoking status: Never   Smokeless tobacco: Never  Vaping Use   Vaping Use: Never used  Substance and Sexual Activity   Alcohol use: No   Drug use: No   Sexual activity: Not Currently  Other Topics Concern   Not on file  Social History Narrative   Right handed   Social Determinants of Health   Financial Resource Strain: Not on file  Food Insecurity: No Food Insecurity (06/25/2022)   Hunger Vital Sign    Worried About Running Out of Food in the Last Year: Never true    Ran Out of Food  in the Last Year: Never true  Transportation Needs: No Transportation Needs (06/25/2022)   PRAPARE - Hydrologist (Medical): No    Lack of Transportation (Non-Medical): No  Physical Activity: Not on file  Stress: Not on file  Social Connections: Not on file     Family History: The patient's family history includes Hypertension in his mother; Stroke in his brother and maternal grandmother.  ROS:   Please see the history of present illness.    ROS  All other systems reviewed and negative.   EKGs/Labs/Other Studies Reviewed:    The following studies were reviewed today: PAP compliance download  EKG:  EKG is not ordered today.    Recent Labs: 06/15/2022: BUN 28; Potassium 5.0; Sodium 138 06/25/2022: Creatinine, Ser 0.88; Hemoglobin 14.1; Platelets 205 07/05/2022: Magnesium 2.3   Recent Lipid Panel    Component Value Date/Time   CHOL 131 08/10/2018 0926   TRIG 91 08/10/2018 0926   HDL 39 (L) 08/10/2018 0926   CHOLHDL 3.4 08/10/2018 0926   LDLCALC 74 08/10/2018 0926    Physical Exam:    VS:  BP 130/68   Pulse 80   Ht 5' 9"$  (1.753 m)   Wt 161 lb 9.6 oz (73.3 kg)   SpO2 98%   BMI 23.86 kg/m     Wt Readings from Last 3 Encounters:  07/23/22 161 lb 9.6 oz (73.3 kg)  07/22/22 166 lb (75.3 kg)  07/05/22 157 lb (71.2 kg)     GEN:  Well nourished, well developed in no acute distress HEENT: Normal NECK: No JVD; No carotid bruits LYMPHATICS: No lymphadenopathy CARDIAC:irregularly irregular, no murmurs, rubs, gallops RESPIRATORY:  Clear to auscultation without rales, wheezing or rhonchi  ABDOMEN: Soft, non-tender, non-distended MUSCULOSKELETAL:  No edema; No deformity  SKIN: Warm and dry NEUROLOGIC:  Alert and oriented x 3 PSYCHIATRIC:  Normal affect  ASSESSMENT:    1. OSA (obstructive sleep apnea)   2. Essential hypertension    PLAN:    In order of problems listed above:  1. OSA - The patient is tolerating PAP therapy well without any problems. The PAP download performed by his DME was personally reviewed and interpreted by me today and showed an AHI of 4.4/hr on 10 cm H2O with 97% compliance in using more than 4 hours nightly.  The patient has been using and benefiting from PAP use and will continue to benefit from therapy.    2.  HTN -BP is controlled on exam today -Continue prescription drug management with spironolactone 25 mg daily, losartan 100 mg daily and Toprol-XL 50 mg daily with as needed refills -I have personally reviewed and interpreted outside labs performed by patient's PCP which showed serum creatinine 0.88 and potassium 5 on 06/15/2022  Medication Adjustments/Labs and Tests Ordered: Current medicines are reviewed at length with the patient today.  Concerns regarding medicines are outlined above.  No orders of the defined types were placed in this encounter.  No orders of the defined types were placed in this encounter.   Signed, Fransico Him, MD  07/23/2022 11:12 AM    [place]

## 2022-07-28 ENCOUNTER — Other Ambulatory Visit: Payer: Self-pay

## 2022-07-28 ENCOUNTER — Other Ambulatory Visit (HOSPITAL_COMMUNITY): Payer: Self-pay

## 2022-07-29 ENCOUNTER — Other Ambulatory Visit (HOSPITAL_COMMUNITY): Payer: Self-pay

## 2022-08-04 DIAGNOSIS — E113293 Type 2 diabetes mellitus with mild nonproliferative diabetic retinopathy without macular edema, bilateral: Secondary | ICD-10-CM | POA: Diagnosis not present

## 2022-08-04 DIAGNOSIS — I10 Essential (primary) hypertension: Secondary | ICD-10-CM | POA: Diagnosis not present

## 2022-08-04 DIAGNOSIS — E7800 Pure hypercholesterolemia, unspecified: Secondary | ICD-10-CM | POA: Diagnosis not present

## 2022-08-04 DIAGNOSIS — E11319 Type 2 diabetes mellitus with unspecified diabetic retinopathy without macular edema: Secondary | ICD-10-CM | POA: Diagnosis not present

## 2022-08-04 DIAGNOSIS — E039 Hypothyroidism, unspecified: Secondary | ICD-10-CM | POA: Diagnosis not present

## 2022-08-04 DIAGNOSIS — E119 Type 2 diabetes mellitus without complications: Secondary | ICD-10-CM | POA: Diagnosis not present

## 2022-08-05 ENCOUNTER — Encounter (INDEPENDENT_AMBULATORY_CARE_PROVIDER_SITE_OTHER): Payer: Medicare PPO | Admitting: Ophthalmology

## 2022-08-05 DIAGNOSIS — H43813 Vitreous degeneration, bilateral: Secondary | ICD-10-CM

## 2022-08-05 DIAGNOSIS — H35033 Hypertensive retinopathy, bilateral: Secondary | ICD-10-CM

## 2022-08-05 DIAGNOSIS — I10 Essential (primary) hypertension: Secondary | ICD-10-CM

## 2022-08-05 DIAGNOSIS — E113312 Type 2 diabetes mellitus with moderate nonproliferative diabetic retinopathy with macular edema, left eye: Secondary | ICD-10-CM | POA: Diagnosis not present

## 2022-08-05 DIAGNOSIS — H33313 Horseshoe tear of retina without detachment, bilateral: Secondary | ICD-10-CM | POA: Diagnosis not present

## 2022-08-05 DIAGNOSIS — E113391 Type 2 diabetes mellitus with moderate nonproliferative diabetic retinopathy without macular edema, right eye: Secondary | ICD-10-CM | POA: Diagnosis not present

## 2022-08-12 ENCOUNTER — Other Ambulatory Visit (HOSPITAL_COMMUNITY): Payer: Self-pay

## 2022-08-13 ENCOUNTER — Other Ambulatory Visit (HOSPITAL_COMMUNITY): Payer: Self-pay

## 2022-08-23 ENCOUNTER — Other Ambulatory Visit (HOSPITAL_COMMUNITY): Payer: Self-pay

## 2022-08-24 ENCOUNTER — Other Ambulatory Visit (HOSPITAL_COMMUNITY): Payer: Self-pay

## 2022-08-26 DIAGNOSIS — I10 Essential (primary) hypertension: Secondary | ICD-10-CM | POA: Diagnosis not present

## 2022-08-26 DIAGNOSIS — E7800 Pure hypercholesterolemia, unspecified: Secondary | ICD-10-CM | POA: Diagnosis not present

## 2022-08-26 DIAGNOSIS — E119 Type 2 diabetes mellitus without complications: Secondary | ICD-10-CM | POA: Diagnosis not present

## 2022-08-26 DIAGNOSIS — E039 Hypothyroidism, unspecified: Secondary | ICD-10-CM | POA: Diagnosis not present

## 2022-09-02 ENCOUNTER — Encounter (INDEPENDENT_AMBULATORY_CARE_PROVIDER_SITE_OTHER): Payer: Medicare PPO | Admitting: Ophthalmology

## 2022-09-02 DIAGNOSIS — E113312 Type 2 diabetes mellitus with moderate nonproliferative diabetic retinopathy with macular edema, left eye: Secondary | ICD-10-CM

## 2022-09-02 DIAGNOSIS — H43813 Vitreous degeneration, bilateral: Secondary | ICD-10-CM | POA: Diagnosis not present

## 2022-09-02 DIAGNOSIS — H35033 Hypertensive retinopathy, bilateral: Secondary | ICD-10-CM

## 2022-09-02 DIAGNOSIS — I10 Essential (primary) hypertension: Secondary | ICD-10-CM | POA: Diagnosis not present

## 2022-09-02 DIAGNOSIS — E113391 Type 2 diabetes mellitus with moderate nonproliferative diabetic retinopathy without macular edema, right eye: Secondary | ICD-10-CM | POA: Diagnosis not present

## 2022-09-03 ENCOUNTER — Other Ambulatory Visit: Payer: Self-pay

## 2022-09-03 ENCOUNTER — Other Ambulatory Visit (HOSPITAL_COMMUNITY): Payer: Self-pay

## 2022-09-15 DIAGNOSIS — R972 Elevated prostate specific antigen [PSA]: Secondary | ICD-10-CM | POA: Diagnosis not present

## 2022-09-21 ENCOUNTER — Other Ambulatory Visit (HOSPITAL_BASED_OUTPATIENT_CLINIC_OR_DEPARTMENT_OTHER): Payer: Self-pay

## 2022-10-02 ENCOUNTER — Other Ambulatory Visit (HOSPITAL_COMMUNITY): Payer: Self-pay

## 2022-10-06 ENCOUNTER — Telehealth: Payer: Self-pay | Admitting: Cardiology

## 2022-10-06 ENCOUNTER — Other Ambulatory Visit (HOSPITAL_COMMUNITY): Payer: Self-pay

## 2022-10-06 MED ORDER — DOFETILIDE 500 MCG PO CAPS
ORAL_CAPSULE | ORAL | 1 refills | Status: DC
  Filled 2022-10-06: qty 180, 90d supply, fill #0

## 2022-10-06 NOTE — Telephone Encounter (Signed)
Calling in about irregular heartbeat. Please advise

## 2022-10-06 NOTE — Telephone Encounter (Signed)
Pt informed that appt w/ Dr. Delia Chimes later this summer has been cancelled. Aware that we will discuss plan of care when he sees Dr. Elberta Fortis on 7/30. Patient verbalized understanding and agreeable to plan.   Sent in Tikosyn refill per pt request.

## 2022-10-07 ENCOUNTER — Other Ambulatory Visit (HOSPITAL_COMMUNITY): Payer: Self-pay

## 2022-10-07 ENCOUNTER — Encounter (INDEPENDENT_AMBULATORY_CARE_PROVIDER_SITE_OTHER): Payer: Medicare PPO | Admitting: Ophthalmology

## 2022-10-07 DIAGNOSIS — H35033 Hypertensive retinopathy, bilateral: Secondary | ICD-10-CM

## 2022-10-07 DIAGNOSIS — I10 Essential (primary) hypertension: Secondary | ICD-10-CM | POA: Diagnosis not present

## 2022-10-07 DIAGNOSIS — H43813 Vitreous degeneration, bilateral: Secondary | ICD-10-CM

## 2022-10-07 DIAGNOSIS — E113391 Type 2 diabetes mellitus with moderate nonproliferative diabetic retinopathy without macular edema, right eye: Secondary | ICD-10-CM | POA: Diagnosis not present

## 2022-10-07 DIAGNOSIS — E113312 Type 2 diabetes mellitus with moderate nonproliferative diabetic retinopathy with macular edema, left eye: Secondary | ICD-10-CM | POA: Diagnosis not present

## 2022-10-07 DIAGNOSIS — Z7985 Long-term (current) use of injectable non-insulin antidiabetic drugs: Secondary | ICD-10-CM

## 2022-10-14 DIAGNOSIS — G4733 Obstructive sleep apnea (adult) (pediatric): Secondary | ICD-10-CM | POA: Diagnosis not present

## 2022-10-18 DIAGNOSIS — I4892 Unspecified atrial flutter: Secondary | ICD-10-CM | POA: Diagnosis not present

## 2022-10-18 DIAGNOSIS — Z79899 Other long term (current) drug therapy: Secondary | ICD-10-CM | POA: Diagnosis not present

## 2022-10-18 DIAGNOSIS — Z5181 Encounter for therapeutic drug level monitoring: Secondary | ICD-10-CM | POA: Diagnosis not present

## 2022-10-18 DIAGNOSIS — I4819 Other persistent atrial fibrillation: Secondary | ICD-10-CM | POA: Diagnosis not present

## 2022-10-18 DIAGNOSIS — Z7901 Long term (current) use of anticoagulants: Secondary | ICD-10-CM | POA: Diagnosis not present

## 2022-10-23 ENCOUNTER — Other Ambulatory Visit (HOSPITAL_COMMUNITY): Payer: Self-pay

## 2022-10-26 ENCOUNTER — Other Ambulatory Visit (HOSPITAL_BASED_OUTPATIENT_CLINIC_OR_DEPARTMENT_OTHER): Payer: Self-pay

## 2022-11-11 ENCOUNTER — Encounter (INDEPENDENT_AMBULATORY_CARE_PROVIDER_SITE_OTHER): Payer: Medicare PPO | Admitting: Ophthalmology

## 2022-11-11 DIAGNOSIS — Z7985 Long-term (current) use of injectable non-insulin antidiabetic drugs: Secondary | ICD-10-CM | POA: Diagnosis not present

## 2022-11-11 DIAGNOSIS — E113312 Type 2 diabetes mellitus with moderate nonproliferative diabetic retinopathy with macular edema, left eye: Secondary | ICD-10-CM

## 2022-11-11 DIAGNOSIS — H35033 Hypertensive retinopathy, bilateral: Secondary | ICD-10-CM | POA: Diagnosis not present

## 2022-11-11 DIAGNOSIS — I10 Essential (primary) hypertension: Secondary | ICD-10-CM

## 2022-11-11 DIAGNOSIS — H43813 Vitreous degeneration, bilateral: Secondary | ICD-10-CM | POA: Diagnosis not present

## 2022-11-11 DIAGNOSIS — H2513 Age-related nuclear cataract, bilateral: Secondary | ICD-10-CM | POA: Diagnosis not present

## 2022-11-11 DIAGNOSIS — E113391 Type 2 diabetes mellitus with moderate nonproliferative diabetic retinopathy without macular edema, right eye: Secondary | ICD-10-CM

## 2022-11-11 DIAGNOSIS — H33303 Unspecified retinal break, bilateral: Secondary | ICD-10-CM | POA: Diagnosis not present

## 2022-11-12 ENCOUNTER — Other Ambulatory Visit (HOSPITAL_COMMUNITY): Payer: Self-pay

## 2022-11-13 ENCOUNTER — Other Ambulatory Visit (HOSPITAL_COMMUNITY): Payer: Self-pay

## 2022-11-26 ENCOUNTER — Other Ambulatory Visit (HOSPITAL_COMMUNITY): Payer: Self-pay

## 2022-12-06 ENCOUNTER — Other Ambulatory Visit (HOSPITAL_COMMUNITY): Payer: Self-pay

## 2022-12-16 ENCOUNTER — Encounter (INDEPENDENT_AMBULATORY_CARE_PROVIDER_SITE_OTHER): Payer: Medicare PPO | Admitting: Ophthalmology

## 2022-12-16 DIAGNOSIS — Z7985 Long-term (current) use of injectable non-insulin antidiabetic drugs: Secondary | ICD-10-CM

## 2022-12-16 DIAGNOSIS — H35371 Puckering of macula, right eye: Secondary | ICD-10-CM

## 2022-12-16 DIAGNOSIS — I10 Essential (primary) hypertension: Secondary | ICD-10-CM | POA: Diagnosis not present

## 2022-12-16 DIAGNOSIS — E113312 Type 2 diabetes mellitus with moderate nonproliferative diabetic retinopathy with macular edema, left eye: Secondary | ICD-10-CM | POA: Diagnosis not present

## 2022-12-16 DIAGNOSIS — H33333 Multiple defects of retina without detachment, bilateral: Secondary | ICD-10-CM | POA: Diagnosis not present

## 2022-12-16 DIAGNOSIS — E113391 Type 2 diabetes mellitus with moderate nonproliferative diabetic retinopathy without macular edema, right eye: Secondary | ICD-10-CM

## 2022-12-16 DIAGNOSIS — H35033 Hypertensive retinopathy, bilateral: Secondary | ICD-10-CM

## 2022-12-16 DIAGNOSIS — H43813 Vitreous degeneration, bilateral: Secondary | ICD-10-CM

## 2022-12-27 ENCOUNTER — Other Ambulatory Visit (HOSPITAL_COMMUNITY): Payer: Self-pay

## 2022-12-27 ENCOUNTER — Other Ambulatory Visit: Payer: Self-pay

## 2022-12-27 MED ORDER — SPIRONOLACTONE 25 MG PO TABS
25.0000 mg | ORAL_TABLET | Freq: Every day | ORAL | 0 refills | Status: DC
  Filled 2022-12-27: qty 90, 90d supply, fill #0

## 2023-01-03 DIAGNOSIS — R972 Elevated prostate specific antigen [PSA]: Secondary | ICD-10-CM | POA: Diagnosis not present

## 2023-01-03 DIAGNOSIS — E11319 Type 2 diabetes mellitus with unspecified diabetic retinopathy without macular edema: Secondary | ICD-10-CM | POA: Diagnosis not present

## 2023-01-03 DIAGNOSIS — Z125 Encounter for screening for malignant neoplasm of prostate: Secondary | ICD-10-CM | POA: Diagnosis not present

## 2023-01-03 DIAGNOSIS — Z0000 Encounter for general adult medical examination without abnormal findings: Secondary | ICD-10-CM | POA: Diagnosis not present

## 2023-01-03 DIAGNOSIS — E7800 Pure hypercholesterolemia, unspecified: Secondary | ICD-10-CM | POA: Diagnosis not present

## 2023-01-03 DIAGNOSIS — I10 Essential (primary) hypertension: Secondary | ICD-10-CM | POA: Diagnosis not present

## 2023-01-03 DIAGNOSIS — E039 Hypothyroidism, unspecified: Secondary | ICD-10-CM | POA: Diagnosis not present

## 2023-01-03 DIAGNOSIS — Z6824 Body mass index (BMI) 24.0-24.9, adult: Secondary | ICD-10-CM | POA: Diagnosis not present

## 2023-01-03 LAB — LAB REPORT - SCANNED
A1c: 6.1
EGFR: 83

## 2023-01-04 ENCOUNTER — Other Ambulatory Visit (HOSPITAL_COMMUNITY): Payer: Self-pay

## 2023-01-04 ENCOUNTER — Encounter: Payer: Self-pay | Admitting: Cardiology

## 2023-01-04 ENCOUNTER — Ambulatory Visit: Payer: Medicare PPO | Attending: Cardiology | Admitting: Cardiology

## 2023-01-04 VITALS — BP 120/78 | HR 62 | Ht 69.0 in | Wt 168.2 lb

## 2023-01-04 DIAGNOSIS — I4819 Other persistent atrial fibrillation: Secondary | ICD-10-CM | POA: Diagnosis not present

## 2023-01-04 MED ORDER — RIVAROXABAN 20 MG PO TABS
20.0000 mg | ORAL_TABLET | Freq: Every day | ORAL | 1 refills | Status: DC
  Filled 2023-01-04 – 2023-01-26 (×2): qty 90, 90d supply, fill #0
  Filled 2023-04-29: qty 90, 90d supply, fill #1
  Filled 2023-05-02: qty 90, 90d supply, fill #0

## 2023-01-04 MED ORDER — DOFETILIDE 500 MCG PO CAPS
500.0000 ug | ORAL_CAPSULE | Freq: Two times a day (BID) | ORAL | 1 refills | Status: DC
  Filled 2023-01-04: qty 180, 90d supply, fill #0
  Filled 2023-04-13 (×2): qty 180, 90d supply, fill #1
  Filled 2023-04-13: qty 180, 90d supply, fill #0

## 2023-01-04 MED ORDER — METOPROLOL SUCCINATE ER 50 MG PO TB24
50.0000 mg | ORAL_TABLET | Freq: Every day | ORAL | 1 refills | Status: DC
  Filled 2023-01-04 – 2023-01-26 (×2): qty 90, 90d supply, fill #0
  Filled 2023-02-14 – 2023-04-14 (×2): qty 90, 90d supply, fill #1

## 2023-01-04 NOTE — Patient Instructions (Signed)
Medication Instructions:  Your physician recommends that you continue on your current medications as directed. Please refer to the Current Medication list given to you today.  *If you need a refill on your cardiac medications before your next appointment, please call your pharmacy*   Lab Work: None ordered   Testing/Procedures: None ordered   Follow-Up: At [facility], you and your health needs are our priority.  As part of our continuing mission to provide you with exceptional heart care, we have created designated Provider Care Teams.  These Care Teams include your primary Cardiologist (physician) and Advanced Practice Providers (APPs -  Physician Assistants and Nurse Practitioners) who all work together to provide you with the care you need, when you need it.  Your next appointment:   6 month(s)  The format for your next appointment:   In Person  Provider:   You will follow up in the Atrial Fibrillation Clinic located at [facility]. Your provider will be: Clint R. Fenton, PA-C  Or  Jon Suarez, PA  Thank you for choosing [facility]!!    Sherri Price, RN [phone]          

## 2023-01-04 NOTE — Progress Notes (Signed)
  Electrophysiology Office Note:   Date:  01/04/2023  ID:  SAGE MON, DOB 05-08-1951, MRN [id number]  Primary Cardiologist: Lesleigh Noe, MD (Inactive) Electrophysiologist: Amontae Ng Jorja Loa, MD      History of Present Illness:   Anthony Cole is a 72 y.o. male with h/o atrial fibrillation seen today for routine electrophysiology followup.  Since last being seen in our clinic the patient reports doing well.  He is continue to have short episodes of atrial fibrillation.  He states that he is in and out of atrial fibrillation.  He feels palpitations when he is in atrial fibrillation.  Fortunately, this does not change his daily activities, and he remains quite active.  he denies chest pain, palpitations, dyspnea, PND, orthopnea, nausea, vomiting, dizziness, syncope, edema, weight gain, or early satiety.   Review of systems complete and found to be negative unless listed in HPI.   EP Information / Studies Reviewed:    EKG is ordered today. Personal review as below.  EKG Interpretation Date/Time:  Tuesday January 04 2023 13:46:50 EDT Ventricular Rate:  62 PR Interval:  160 QRS Duration:  72 QT Interval:  422 QTC Calculation: 428 R Axis:   52  Text Interpretation: Normal sinus rhythm Nonspecific ST and T wave abnormality When compared with ECG of 26-Jun-2022 06:22, Sinus rhythm has replaced Atrial fibrillation Confirmed by Abygayle Deltoro ([id number]) on 01/04/2023 1:55:53 PM     Risk Assessment/Calculations:    CHA2DS2-VASc Score = 4   This indicates a 4.8% annual risk of stroke. The patient's score is based upon: CHF History: 0 HTN History: 1 Diabetes History: 1 Stroke History: 0 Vascular Disease History: 1 Age Score: 1 Gender Score: 0             Physical Exam:   VS:  BP 120/78 (BP Location: Left Arm, Patient Position: Sitting, Cuff Size: Normal)   Pulse 62   Ht 5\' 9"  (1.753 m)   Wt 168 lb 3.2 oz (76.3 kg)   SpO2 97%   BMI 24.84 kg/m    Wt Readings from Last  3 Encounters:  01/04/23 168 lb 3.2 oz (76.3 kg)  07/23/22 161 lb 9.6 oz (73.3 kg)  07/22/22 166 lb (75.3 kg)     GEN: Well nourished, well developed in no acute distress NECK: No JVD; No carotid bruits CARDIAC: Regular rate and rhythm, no murmurs, rubs, gallops RESPIRATORY:  Clear to auscultation without rales, wheezing or rhonchi  ABDOMEN: Soft, non-tender, non-distended EXTREMITIES:  No edema; No deformity   ASSESSMENT AND PLAN:    1.  Persistent atrial fibrillation: CHA2DS2-VASc of 3.  Currently on Xarelto and dofetilide.  Status post ablation 08/25/2017.  Continues to have intermittent episodes of atrial fibrillation and mostly in sinus rhythm.  Sincerity Cedar continue with current management.  He may wish ablation in the future.  2.  Hypertension: Currently well-controlled  3.  Obstructive sleep apnea: CPAP compliance encouraged  4.  Secondary hypercoagulable state: Currently on Xarelto for atrial fibrillation  5.  High risk medication monitoring: Currently on dofetilide.  QTc and recent labs without abnormality  Follow up with [facility] Clinic in 6 months  Signed, Kaio Kuhlman Jorja Loa, MD

## 2023-01-05 ENCOUNTER — Other Ambulatory Visit (HOSPITAL_COMMUNITY): Payer: Self-pay

## 2023-01-06 ENCOUNTER — Other Ambulatory Visit (HOSPITAL_COMMUNITY): Payer: Self-pay

## 2023-01-06 DIAGNOSIS — E7800 Pure hypercholesterolemia, unspecified: Secondary | ICD-10-CM | POA: Diagnosis not present

## 2023-01-06 DIAGNOSIS — L719 Rosacea, unspecified: Secondary | ICD-10-CM | POA: Diagnosis not present

## 2023-01-06 DIAGNOSIS — E11319 Type 2 diabetes mellitus with unspecified diabetic retinopathy without macular edema: Secondary | ICD-10-CM | POA: Diagnosis not present

## 2023-01-06 DIAGNOSIS — D6869 Other thrombophilia: Secondary | ICD-10-CM | POA: Diagnosis not present

## 2023-01-06 DIAGNOSIS — Z6823 Body mass index (BMI) 23.0-23.9, adult: Secondary | ICD-10-CM | POA: Diagnosis not present

## 2023-01-06 DIAGNOSIS — I480 Paroxysmal atrial fibrillation: Secondary | ICD-10-CM | POA: Diagnosis not present

## 2023-01-06 DIAGNOSIS — Z0000 Encounter for general adult medical examination without abnormal findings: Secondary | ICD-10-CM | POA: Diagnosis not present

## 2023-01-06 DIAGNOSIS — E039 Hypothyroidism, unspecified: Secondary | ICD-10-CM | POA: Diagnosis not present

## 2023-01-06 DIAGNOSIS — I10 Essential (primary) hypertension: Secondary | ICD-10-CM | POA: Diagnosis not present

## 2023-01-06 MED ORDER — LEVOTHYROXINE SODIUM 112 MCG PO TABS
112.0000 ug | ORAL_TABLET | Freq: Every morning | ORAL | 4 refills | Status: DC
  Filled 2023-01-06 – 2023-03-21 (×3): qty 90, 90d supply, fill #0

## 2023-01-06 MED ORDER — DOXYCYCLINE HYCLATE 100 MG PO TABS
100.0000 mg | ORAL_TABLET | Freq: Two times a day (BID) | ORAL | 4 refills | Status: AC
  Filled 2023-01-06 – 2023-02-14 (×2): qty 180, 90d supply, fill #0
  Filled 2023-05-09 (×2): qty 180, 90d supply, fill #1
  Filled 2023-08-15: qty 180, 90d supply, fill #2

## 2023-01-06 MED ORDER — ROSUVASTATIN CALCIUM 40 MG PO TABS
40.0000 mg | ORAL_TABLET | Freq: Every day | ORAL | 4 refills | Status: DC
  Filled 2023-01-06 – 2023-01-26 (×3): qty 90, 90d supply, fill #0
  Filled 2023-05-09: qty 90, 90d supply, fill #1

## 2023-01-06 MED ORDER — JARDIANCE 10 MG PO TABS
10.0000 mg | ORAL_TABLET | Freq: Every day | ORAL | 4 refills | Status: DC
  Filled 2023-01-06 – 2023-01-26 (×3): qty 90, 90d supply, fill #0
  Filled 2023-05-09: qty 90, 90d supply, fill #1

## 2023-01-06 MED ORDER — SPIRONOLACTONE 25 MG PO TABS
25.0000 mg | ORAL_TABLET | Freq: Every day | ORAL | 4 refills | Status: DC
  Filled 2023-01-06 – 2023-03-15 (×4): qty 90, 90d supply, fill #0
  Filled 2023-06-04: qty 90, 90d supply, fill #1

## 2023-01-10 ENCOUNTER — Other Ambulatory Visit (HOSPITAL_COMMUNITY): Payer: Self-pay

## 2023-01-14 ENCOUNTER — Other Ambulatory Visit (HOSPITAL_COMMUNITY): Payer: Self-pay

## 2023-01-18 ENCOUNTER — Other Ambulatory Visit (HOSPITAL_COMMUNITY): Payer: Self-pay

## 2023-01-20 ENCOUNTER — Encounter (INDEPENDENT_AMBULATORY_CARE_PROVIDER_SITE_OTHER): Payer: Medicare PPO | Admitting: Ophthalmology

## 2023-01-20 ENCOUNTER — Ambulatory Visit: Payer: Medicare PPO | Admitting: Cardiothoracic Surgery

## 2023-01-20 DIAGNOSIS — Z7985 Long-term (current) use of injectable non-insulin antidiabetic drugs: Secondary | ICD-10-CM | POA: Diagnosis not present

## 2023-01-20 DIAGNOSIS — H43813 Vitreous degeneration, bilateral: Secondary | ICD-10-CM

## 2023-01-20 DIAGNOSIS — H35033 Hypertensive retinopathy, bilateral: Secondary | ICD-10-CM | POA: Diagnosis not present

## 2023-01-20 DIAGNOSIS — I10 Essential (primary) hypertension: Secondary | ICD-10-CM

## 2023-01-20 DIAGNOSIS — E113312 Type 2 diabetes mellitus with moderate nonproliferative diabetic retinopathy with macular edema, left eye: Secondary | ICD-10-CM

## 2023-01-20 DIAGNOSIS — H33303 Unspecified retinal break, bilateral: Secondary | ICD-10-CM

## 2023-01-26 ENCOUNTER — Other Ambulatory Visit (HOSPITAL_BASED_OUTPATIENT_CLINIC_OR_DEPARTMENT_OTHER): Payer: Self-pay

## 2023-01-26 ENCOUNTER — Other Ambulatory Visit (HOSPITAL_COMMUNITY): Payer: Self-pay

## 2023-01-27 ENCOUNTER — Other Ambulatory Visit (HOSPITAL_COMMUNITY): Payer: Self-pay

## 2023-01-28 ENCOUNTER — Other Ambulatory Visit (HOSPITAL_COMMUNITY): Payer: Self-pay

## 2023-02-14 ENCOUNTER — Other Ambulatory Visit (HOSPITAL_COMMUNITY): Payer: Self-pay

## 2023-02-15 ENCOUNTER — Other Ambulatory Visit (HOSPITAL_COMMUNITY): Payer: Self-pay

## 2023-02-16 ENCOUNTER — Other Ambulatory Visit (HOSPITAL_COMMUNITY): Payer: Self-pay

## 2023-02-19 ENCOUNTER — Other Ambulatory Visit (HOSPITAL_COMMUNITY): Payer: Self-pay

## 2023-02-21 ENCOUNTER — Ambulatory Visit: Payer: Medicare PPO | Admitting: Cardiology

## 2023-02-24 ENCOUNTER — Other Ambulatory Visit (HOSPITAL_COMMUNITY): Payer: Self-pay

## 2023-02-24 ENCOUNTER — Encounter (INDEPENDENT_AMBULATORY_CARE_PROVIDER_SITE_OTHER): Payer: Medicare PPO | Admitting: Ophthalmology

## 2023-02-24 DIAGNOSIS — H43813 Vitreous degeneration, bilateral: Secondary | ICD-10-CM

## 2023-02-24 DIAGNOSIS — Z7985 Long-term (current) use of injectable non-insulin antidiabetic drugs: Secondary | ICD-10-CM

## 2023-02-24 DIAGNOSIS — I10 Essential (primary) hypertension: Secondary | ICD-10-CM | POA: Diagnosis not present

## 2023-02-24 DIAGNOSIS — H35033 Hypertensive retinopathy, bilateral: Secondary | ICD-10-CM | POA: Diagnosis not present

## 2023-02-24 DIAGNOSIS — E113391 Type 2 diabetes mellitus with moderate nonproliferative diabetic retinopathy without macular edema, right eye: Secondary | ICD-10-CM | POA: Diagnosis not present

## 2023-02-24 DIAGNOSIS — E113312 Type 2 diabetes mellitus with moderate nonproliferative diabetic retinopathy with macular edema, left eye: Secondary | ICD-10-CM

## 2023-02-24 DIAGNOSIS — H33303 Unspecified retinal break, bilateral: Secondary | ICD-10-CM | POA: Diagnosis not present

## 2023-02-24 MED ORDER — CIPROFLOXACIN HCL 0.3 % OP SOLN
1.0000 [drp] | Freq: Four times a day (QID) | OPHTHALMIC | 12 refills | Status: DC
  Filled 2023-02-24: qty 10, 30d supply, fill #0

## 2023-03-02 DIAGNOSIS — Z23 Encounter for immunization: Secondary | ICD-10-CM | POA: Diagnosis not present

## 2023-03-03 ENCOUNTER — Other Ambulatory Visit (HOSPITAL_COMMUNITY): Payer: Self-pay

## 2023-03-10 DIAGNOSIS — Z01118 Encounter for examination of ears and hearing with other abnormal findings: Secondary | ICD-10-CM | POA: Diagnosis not present

## 2023-03-10 DIAGNOSIS — H906 Mixed conductive and sensorineural hearing loss, bilateral: Secondary | ICD-10-CM | POA: Diagnosis not present

## 2023-03-14 DIAGNOSIS — Z23 Encounter for immunization: Secondary | ICD-10-CM | POA: Diagnosis not present

## 2023-03-15 ENCOUNTER — Other Ambulatory Visit (HOSPITAL_BASED_OUTPATIENT_CLINIC_OR_DEPARTMENT_OTHER): Payer: Self-pay

## 2023-03-21 ENCOUNTER — Other Ambulatory Visit (HOSPITAL_BASED_OUTPATIENT_CLINIC_OR_DEPARTMENT_OTHER): Payer: Self-pay

## 2023-03-24 DIAGNOSIS — H906 Mixed conductive and sensorineural hearing loss, bilateral: Secondary | ICD-10-CM | POA: Diagnosis not present

## 2023-03-24 DIAGNOSIS — Z9889 Other specified postprocedural states: Secondary | ICD-10-CM | POA: Diagnosis not present

## 2023-03-24 DIAGNOSIS — H90A31 Mixed conductive and sensorineural hearing loss, unilateral, right ear with restricted hearing on the contralateral side: Secondary | ICD-10-CM | POA: Diagnosis not present

## 2023-03-24 DIAGNOSIS — H6993 Unspecified Eustachian tube disorder, bilateral: Secondary | ICD-10-CM | POA: Diagnosis not present

## 2023-03-24 DIAGNOSIS — H7411 Adhesive right middle ear disease: Secondary | ICD-10-CM | POA: Diagnosis not present

## 2023-03-24 DIAGNOSIS — H918X3 Other specified hearing loss, bilateral: Secondary | ICD-10-CM | POA: Diagnosis not present

## 2023-03-31 ENCOUNTER — Encounter (INDEPENDENT_AMBULATORY_CARE_PROVIDER_SITE_OTHER): Payer: Medicare PPO | Admitting: Ophthalmology

## 2023-03-31 DIAGNOSIS — H35372 Puckering of macula, left eye: Secondary | ICD-10-CM | POA: Diagnosis not present

## 2023-03-31 DIAGNOSIS — I10 Essential (primary) hypertension: Secondary | ICD-10-CM | POA: Diagnosis not present

## 2023-03-31 DIAGNOSIS — H43813 Vitreous degeneration, bilateral: Secondary | ICD-10-CM

## 2023-03-31 DIAGNOSIS — Z7985 Long-term (current) use of injectable non-insulin antidiabetic drugs: Secondary | ICD-10-CM | POA: Diagnosis not present

## 2023-03-31 DIAGNOSIS — E113312 Type 2 diabetes mellitus with moderate nonproliferative diabetic retinopathy with macular edema, left eye: Secondary | ICD-10-CM | POA: Diagnosis not present

## 2023-03-31 DIAGNOSIS — H33303 Unspecified retinal break, bilateral: Secondary | ICD-10-CM | POA: Diagnosis not present

## 2023-03-31 DIAGNOSIS — H35033 Hypertensive retinopathy, bilateral: Secondary | ICD-10-CM

## 2023-03-31 DIAGNOSIS — E113391 Type 2 diabetes mellitus with moderate nonproliferative diabetic retinopathy without macular edema, right eye: Secondary | ICD-10-CM | POA: Diagnosis not present

## 2023-03-31 DIAGNOSIS — H2513 Age-related nuclear cataract, bilateral: Secondary | ICD-10-CM | POA: Diagnosis not present

## 2023-04-04 ENCOUNTER — Encounter: Payer: Self-pay | Admitting: Podiatry

## 2023-04-04 ENCOUNTER — Ambulatory Visit: Payer: Medicare PPO | Admitting: Podiatry

## 2023-04-04 DIAGNOSIS — E119 Type 2 diabetes mellitus without complications: Secondary | ICD-10-CM | POA: Diagnosis not present

## 2023-04-04 DIAGNOSIS — Z794 Long term (current) use of insulin: Secondary | ICD-10-CM

## 2023-04-04 NOTE — Progress Notes (Signed)
This patient presents to the office for diabetic foot exam.  This patient says there is no pain or discomfort in her feet.  No history of infection or drainage.  This patient presents to the office for foot exam due to having a history of diabetes.  Patient is taking xarelto.  Vascular  Dorsalis pedis and posterior tibial pulses are palpable  B/L.  Capillary return  WNL.  Temperature gradient is  WNL.  Skin turgor  WNL  Sensorium  Senn Weinstein monofilament wire  WNL. Normal tactile sensation.  Nail Exam  Patient has normal nails with no evidence of bacterial or fungal infection.  Orthopedic  Exam  Muscle tone and muscle strength  WNL.  No limitations of motion feet  B/L.  No crepitus or joint effusion noted.  Foot type is unremarkable and digits show no abnormalities.  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  Skin  No open lesions.  Normal skin texture and turgor.   Diabetes with no complications  Diabetic foot exam was performed.  There is no evidence of vascular or neurologic pathology.  RTC  1 year.   Helane Gunther DPM

## 2023-04-12 ENCOUNTER — Other Ambulatory Visit: Payer: Self-pay

## 2023-04-12 DIAGNOSIS — E039 Hypothyroidism, unspecified: Secondary | ICD-10-CM | POA: Diagnosis not present

## 2023-04-12 DIAGNOSIS — E113391 Type 2 diabetes mellitus with moderate nonproliferative diabetic retinopathy without macular edema, right eye: Secondary | ICD-10-CM | POA: Diagnosis not present

## 2023-04-12 DIAGNOSIS — E063 Autoimmune thyroiditis: Secondary | ICD-10-CM | POA: Diagnosis not present

## 2023-04-12 DIAGNOSIS — E113312 Type 2 diabetes mellitus with moderate nonproliferative diabetic retinopathy with macular edema, left eye: Secondary | ICD-10-CM | POA: Diagnosis not present

## 2023-04-12 DIAGNOSIS — I6930 Unspecified sequelae of cerebral infarction: Secondary | ICD-10-CM | POA: Diagnosis not present

## 2023-04-13 ENCOUNTER — Other Ambulatory Visit (HOSPITAL_COMMUNITY): Payer: Self-pay

## 2023-04-14 ENCOUNTER — Other Ambulatory Visit (HOSPITAL_COMMUNITY): Payer: Self-pay

## 2023-04-14 DIAGNOSIS — N138 Other obstructive and reflux uropathy: Secondary | ICD-10-CM | POA: Diagnosis not present

## 2023-04-14 DIAGNOSIS — N401 Enlarged prostate with lower urinary tract symptoms: Secondary | ICD-10-CM | POA: Diagnosis not present

## 2023-04-14 DIAGNOSIS — E291 Testicular hypofunction: Secondary | ICD-10-CM | POA: Diagnosis not present

## 2023-04-14 DIAGNOSIS — R972 Elevated prostate specific antigen [PSA]: Secondary | ICD-10-CM | POA: Diagnosis not present

## 2023-04-14 DIAGNOSIS — N529 Male erectile dysfunction, unspecified: Secondary | ICD-10-CM | POA: Diagnosis not present

## 2023-04-14 DIAGNOSIS — N281 Cyst of kidney, acquired: Secondary | ICD-10-CM | POA: Diagnosis not present

## 2023-04-18 ENCOUNTER — Other Ambulatory Visit (HOSPITAL_COMMUNITY): Payer: Self-pay

## 2023-04-25 ENCOUNTER — Encounter: Payer: Medicare PPO | Attending: Internal Medicine | Admitting: Nutrition

## 2023-04-25 ENCOUNTER — Other Ambulatory Visit: Payer: Self-pay

## 2023-04-25 VITALS — Wt 173.6 lb

## 2023-04-25 DIAGNOSIS — E1121 Type 2 diabetes mellitus with diabetic nephropathy: Secondary | ICD-10-CM

## 2023-04-25 DIAGNOSIS — Z713 Dietary counseling and surveillance: Secondary | ICD-10-CM | POA: Diagnosis not present

## 2023-04-25 DIAGNOSIS — E113391 Type 2 diabetes mellitus with moderate nonproliferative diabetic retinopathy without macular edema, right eye: Secondary | ICD-10-CM | POA: Diagnosis not present

## 2023-04-26 NOTE — Patient Instructions (Signed)
Make sure all meals have protein, 15-30 grams of carbohydrate, and no more than 1 tsp of total fat. Drink skim milk with meals, or switch to 1% milk and have only 1 cup Test blood sugar before and 2hr. After one meal each day.

## 2023-04-26 NOTE — Progress Notes (Signed)
Patient came in with diet history of daily diet for last 2 years, but none in last 4 months.  Says would like help with his diet, that his Hgb A1C was up to 6.5, and weight is going up. SBGM:  Accu-Chek meter-once daily-fasting only 90-118, but today 140.  He retested and it was 101.   Diabetes Medications:  Trulicity, [place] Pepper Exercise- 5-6 days/wk.  Walks for 20 minutes sometimes after breakfast and then at gym for 20 minutes, or does stationary bike for 30 minutes.[name]  3X/wk does strength training with machines.   Diet:  Pt. Believes that carbs are bad, and that these are all that will raise his blood sugar.  Patient not specific in quantites eaten, as he mixes many vegetables together and then mixes this with varying amounts of protein and fat.  Typical day: 8AM:  up Thyroid medicine with small amount of water 8:30-9AM: 2-3 ounces of chicken or protein from supper, or greek yogurt with fish, tuna, or sardines and crackers.   10:30 exercise 30 minutes to one hour 12:30 above proteins like can of sardines in water with 4 crackers and mixed vegetables that are mostly non starchy 5:30-7PM: supper as above at lunch.  Cooked mixed vegetables that  are mostly non starchy with rice packet-brown 25 grams, water to drink 10PM glass of skim milk. With small "snack".ice cream, or "something else.  Would not clarify, says is different every night.    Likes to snack on milk chocolate.  Would not tell me quantity or how often during the day.  Says it is a treat he does almost daily.  Denies getting up during the night to eat. Discussion:  Need for all 3 major food groups in each meal- what these are and what food go into each group, and how much he should be eating.  Turns out, he is eating some meals and snacks are high in fat  ice cream or crackers and cheese and milk chocolates.  Discussed the fact that these will also raise blood sugar and that for simplicity, he should look at the total calories of all his  snacks and limit them to no more than 125 calories.   Need for carbohydrate at each meal-at least one, if better, two or three when very active.  Serving sizes shown for rice, beans, etc that he should have a most meals.  Need to stop drinking skim milk at a between meal snack.  This raises blood sugar very quickly.  Encouraged him to have his milk with a meal, or to switch to 1% and limit size to 1 cup. Need to test blood sugar before and 2 hours after one meal per day.  Goals for readings: ac: less than 120, and 2hr. Pc: less than 160 Believer overall understanding is questionable, but notes given to him for above suggestions and phone number given if he has questions after leaving here.  Appt. Made for 4 week follow up to check weight

## 2023-04-28 ENCOUNTER — Other Ambulatory Visit (HOSPITAL_COMMUNITY): Payer: Self-pay

## 2023-04-28 DIAGNOSIS — L814 Other melanin hyperpigmentation: Secondary | ICD-10-CM | POA: Diagnosis not present

## 2023-04-28 DIAGNOSIS — D492 Neoplasm of unspecified behavior of bone, soft tissue, and skin: Secondary | ICD-10-CM | POA: Diagnosis not present

## 2023-04-28 DIAGNOSIS — B001 Herpesviral vesicular dermatitis: Secondary | ICD-10-CM | POA: Diagnosis not present

## 2023-04-28 DIAGNOSIS — D225 Melanocytic nevi of trunk: Secondary | ICD-10-CM | POA: Diagnosis not present

## 2023-04-28 DIAGNOSIS — L718 Other rosacea: Secondary | ICD-10-CM | POA: Diagnosis not present

## 2023-04-28 DIAGNOSIS — L570 Actinic keratosis: Secondary | ICD-10-CM | POA: Diagnosis not present

## 2023-04-28 DIAGNOSIS — L821 Other seborrheic keratosis: Secondary | ICD-10-CM | POA: Diagnosis not present

## 2023-04-28 MED ORDER — TRETINOIN 0.025 % EX CREA
TOPICAL_CREAM | CUTANEOUS | 1 refills | Status: AC
  Filled 2023-04-28: qty 45, 30d supply, fill #0
  Filled 2023-06-04: qty 45, 30d supply, fill #1

## 2023-04-28 MED ORDER — ACYCLOVIR 5 % EX CREA
1.0000 | TOPICAL_CREAM | Freq: Three times a day (TID) | CUTANEOUS | 1 refills | Status: AC | PRN
Start: 2023-04-28 — End: ?
  Filled 2023-04-28: qty 5, 2d supply, fill #0

## 2023-04-28 MED ORDER — METRONIDAZOLE 0.75 % EX GEL
CUTANEOUS | 2 refills | Status: AC
  Filled 2023-04-28: qty 45, 30d supply, fill #0
  Filled 2023-06-04 – 2023-11-01 (×5): qty 45, 30d supply, fill #1

## 2023-04-28 MED ORDER — VALACYCLOVIR HCL 1 G PO TABS
2000.0000 mg | ORAL_TABLET | Freq: Two times a day (BID) | ORAL | 1 refills | Status: AC
  Filled 2023-04-28: qty 4, 1d supply, fill #0
  Filled 2023-05-09 (×2): qty 4, 1d supply, fill #1

## 2023-04-29 ENCOUNTER — Other Ambulatory Visit (HOSPITAL_COMMUNITY): Payer: Self-pay

## 2023-05-02 ENCOUNTER — Other Ambulatory Visit (HOSPITAL_COMMUNITY): Payer: Self-pay

## 2023-05-02 ENCOUNTER — Encounter (INDEPENDENT_AMBULATORY_CARE_PROVIDER_SITE_OTHER): Payer: Medicare PPO | Admitting: Ophthalmology

## 2023-05-02 ENCOUNTER — Other Ambulatory Visit: Payer: Self-pay

## 2023-05-02 DIAGNOSIS — H35033 Hypertensive retinopathy, bilateral: Secondary | ICD-10-CM | POA: Diagnosis not present

## 2023-05-02 DIAGNOSIS — I10 Essential (primary) hypertension: Secondary | ICD-10-CM

## 2023-05-02 DIAGNOSIS — H33303 Unspecified retinal break, bilateral: Secondary | ICD-10-CM | POA: Diagnosis not present

## 2023-05-02 DIAGNOSIS — E113312 Type 2 diabetes mellitus with moderate nonproliferative diabetic retinopathy with macular edema, left eye: Secondary | ICD-10-CM

## 2023-05-02 DIAGNOSIS — Z7985 Long-term (current) use of injectable non-insulin antidiabetic drugs: Secondary | ICD-10-CM | POA: Diagnosis not present

## 2023-05-02 DIAGNOSIS — H43813 Vitreous degeneration, bilateral: Secondary | ICD-10-CM

## 2023-05-02 DIAGNOSIS — E113391 Type 2 diabetes mellitus with moderate nonproliferative diabetic retinopathy without macular edema, right eye: Secondary | ICD-10-CM

## 2023-05-06 ENCOUNTER — Other Ambulatory Visit (HOSPITAL_COMMUNITY): Payer: Self-pay

## 2023-05-09 ENCOUNTER — Other Ambulatory Visit: Payer: Self-pay | Admitting: Cardiology

## 2023-05-09 ENCOUNTER — Other Ambulatory Visit (HOSPITAL_BASED_OUTPATIENT_CLINIC_OR_DEPARTMENT_OTHER): Payer: Self-pay

## 2023-05-09 ENCOUNTER — Encounter (HOSPITAL_BASED_OUTPATIENT_CLINIC_OR_DEPARTMENT_OTHER): Payer: Self-pay

## 2023-05-10 ENCOUNTER — Encounter: Payer: Self-pay | Admitting: Cardiology

## 2023-05-10 ENCOUNTER — Ambulatory Visit: Payer: Medicare PPO | Attending: Cardiology | Admitting: Cardiology

## 2023-05-10 ENCOUNTER — Other Ambulatory Visit (HOSPITAL_BASED_OUTPATIENT_CLINIC_OR_DEPARTMENT_OTHER): Payer: Self-pay

## 2023-05-10 VITALS — BP 122/80 | HR 83 | Resp 16 | Ht 69.0 in | Wt 173.6 lb

## 2023-05-10 DIAGNOSIS — I10 Essential (primary) hypertension: Secondary | ICD-10-CM

## 2023-05-10 DIAGNOSIS — I4819 Other persistent atrial fibrillation: Secondary | ICD-10-CM

## 2023-05-10 DIAGNOSIS — D6869 Other thrombophilia: Secondary | ICD-10-CM

## 2023-05-10 MED FILL — Losartan Potassium Tab 100 MG: ORAL | 90 days supply | Qty: 90 | Fill #0 | Status: AC

## 2023-05-10 NOTE — Patient Instructions (Signed)
Medication Instructions:  The current medical regimen is effective;  continue present plan and medications.  *If you need a refill on your cardiac medications before your next appointment, please call your pharmacy*   Follow-Up: At [facility], you and your health needs are our priority.  As part of our continuing mission to provide you with exceptional heart care, we have created designated Provider Care Teams.  These Care Teams include your primary Cardiologist (physician) and Advanced Practice Providers (APPs -  Physician Assistants and Nurse Practitioners) who all work together to provide you with the care you need, when you need it.  We recommend signing up for the patient portal called "MyChart".  Sign up information is provided on this After Visit Summary.  MyChart is used to connect with patients for Virtual Visits (Telemedicine).  Patients are able to view lab/test results, encounter notes, upcoming appointments, etc.  Non-urgent messages can be sent to your provider as well.   To learn more about what you can do with MyChart, go to [email or web address].    Your next appointment:   2 year(s)  Provider:   Dr Candee Furbish

## 2023-05-10 NOTE — Progress Notes (Signed)
Cardiology Office Note:  .   Date:  05/10/2023  ID:  Anthony Cole, DOB 23-Jul-1950, MRN [id number] PCP: Daisy Floro, MD  [facility] Providers Cardiologist:  Donato Schultz, MD Electrophysiologist:  Will Jorja Loa, MD  Sleep Medicine:  Armanda Magic, MD     History of Present Illness: .   Anthony Cole is a 72 y.o. male Discussed with the use of AI scribe   History of Present Illness   The patient is a 72 year old male with a history of coronary artery disease, type 2 diabetes, and paroxysmal atrial fibrillation. He underwent ablation in 2019, but experienced recurrent atrial fibrillation, leading to the initiation of Tikosyn in October 2019. Despite the medication, the patient continues to have short episodes of atrial fibrillation. An echocardiogram in 2022 revealed an ejection fraction of 65% and a severely dilated left atrium.  The patient has been under the care of multiple specialists, including an electrophysiologist and a cardiologist. He has been considering a repeat ablation, but has expressed concerns about the procedure. The patient is currently on Xarelto for anticoagulation and dofetilide, with regular monitoring of his QTc interval.  In addition to his cardiac issues, the patient has a history of stroke, which occurred approximately two years ago. He did not seek immediate medical attention at the time, but the stroke was later identified through a brain MRI. The patient also has a family history of stroke, with both a brother and a maternal grandmother having experienced this condition.  The patient has also been experiencing issues with his eyesight, including blurriness and the need for regular eye injections. He underwent laser surgery on both retinas approximately four years ago due to tears. The patient has expressed concern about whether his medications could be contributing to his declining eyesight.  The patient's atrial fibrillation has been a  significant concern, with multiple cardioversions and medication changes over the years. Despite these interventions, the patient reports being "in and out" of atrial fibrillation. He has expressed a desire for regular follow-up and management of his prescriptions.   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           Studies Reviewed: .        Results LABS QTc: Normal  DIAGNOSTIC Echocardiogram: Ejection fraction 65%, severely dilated left atrium   12/2021 MRI brain 1.  Remote appearing infarct in the subcortical white matter of the left frontal operculum with findings suggesting the possibility of some acute/recent infarct around the periphery.  2.  Remote lacunar infarcts in the left periatrial white matter, bilateral thalami, and right hemipons.  3.  Few scattered remote microhemorrhages.  4.  No enhancing lesions within the internal auditory canals or cerebellopontine angle cisterns.   These findings were discussed with Dr. Dorma Russell by Dr. Chryl Heck via telephone on 12/21/2021 at 1:50 PM.  Risk Assessment/Calculations:            Physical Exam:   VS:  BP 122/80 (BP Location: Right Arm, Patient Position: Sitting, Cuff Size: Normal)   Pulse 83   Resp 16   Ht 5\' 9"  (1.753 m)   Wt 173 lb 9.6 oz (78.7 kg)   SpO2 98%   BMI 25.64 kg/m    Wt Readings from Last 3 Encounters:  05/10/23 173 lb 9.6 oz (78.7 kg)  04/26/23 173 lb 9.6 oz (78.7 kg)  01/04/23 168 lb 3.2 oz (76.3 kg)    GEN: Well nourished, well developed in no acute distress NECK: No  JVD; No carotid bruits CARDIAC: RRR, no murmurs, no rubs, no gallops RESPIRATORY:  Clear to auscultation without rales, wheezing or rhonchi  ABDOMEN: Soft, non-tender, non-distended EXTREMITIES:  No edema; No deformity   ASSESSMENT AND PLAN: .    Assessment and Plan    Paroxysmal Atrial Fibrillation Recurrent episodes since 2013. Status post ablation in 2019 with continued episodes. Currently on dofetilide (Tikosyn) with  normal QTc. CHADS-VASc score is 4. Echocardiogram in 2022 showed ejection fraction of 65% with severely dilated left atrium. Discussed repeat ablation but patient prefers current management. - Continue dofetilide (Tikosyn) with high-risk medication monitoring (Dr. Elberta Fortis and AFIB clinic) - Continue Xarelto for anticoagulation - Follow up with AFib clinic  Coronary Artery Disease Calcium score of 250. No recent events or management changes. - Continue current management as per primary care physician  Stroke Stroke two years ago he states with no residual symptoms. See MRI above. Family history of stroke noted. - Continue current management as per primary care physician - Maintain anticoagulation with Xarelto  Type 2 Diabetes Mellitus Chronic condition. No specific discussion of current management or complications in this visit. - Continue current management as per primary care physician  Retinal Tears and Blurriness Laser surgery for retinal tears four years ago. Currently receiving eye injections every five weeks for blurriness. - Continue follow-up with ophthalmologist Dr. Alan Mulder  General Health Maintenance No specific issues discussed. - Ensure regular follow-ups with primary care physician for general health maintenance  Follow-up - Schedule follow-up with AFib clinic/EP - Two-year follow-up with general cardiologist.               Signed, Donato Schultz, MD

## 2023-05-11 ENCOUNTER — Other Ambulatory Visit: Payer: Self-pay

## 2023-05-11 ENCOUNTER — Encounter: Payer: Medicare PPO | Attending: Internal Medicine | Admitting: Nutrition

## 2023-05-11 ENCOUNTER — Other Ambulatory Visit (HOSPITAL_BASED_OUTPATIENT_CLINIC_OR_DEPARTMENT_OTHER): Payer: Self-pay

## 2023-05-11 DIAGNOSIS — E11311 Type 2 diabetes mellitus with unspecified diabetic retinopathy with macular edema: Secondary | ICD-10-CM | POA: Insufficient documentation

## 2023-05-11 NOTE — Progress Notes (Signed)
Patient continues to eat small amounts of carbs at each meal-5-10 grams, but will add more in between meal-15-30.  HS snack can contain as much as 30.   Blood sugar readings: FBS: 94-114, and 2hr. Pc: 92-96 Tried to convince him to eat more carbs with meals-15-25, due to walking and other activities he does during the day.  But he reports not feeling comfortable doing this.  He would prefer to do between meal snacks.  Weight has not changed.  Meals are balanced with 7 grams of protein at breakfast, 20-30 at lunch and supper, with lots of veg., and low in fat. All snack options and meats are low in sodium and he also is limiting his saturated fat to no more than 10/day.

## 2023-05-16 ENCOUNTER — Other Ambulatory Visit (HOSPITAL_BASED_OUTPATIENT_CLINIC_OR_DEPARTMENT_OTHER): Payer: Self-pay

## 2023-05-18 ENCOUNTER — Other Ambulatory Visit: Payer: Self-pay

## 2023-05-18 DIAGNOSIS — G4733 Obstructive sleep apnea (adult) (pediatric): Secondary | ICD-10-CM | POA: Diagnosis not present

## 2023-05-20 ENCOUNTER — Ambulatory Visit (HOSPITAL_COMMUNITY): Payer: Medicare PPO | Admitting: Internal Medicine

## 2023-05-23 ENCOUNTER — Other Ambulatory Visit (HOSPITAL_COMMUNITY): Payer: Self-pay

## 2023-05-23 MED ORDER — ACYCLOVIR 5 % EX CREA
TOPICAL_CREAM | CUTANEOUS | 1 refills | Status: AC
  Filled 2023-05-23 – 2023-05-25 (×2): qty 15, 4d supply, fill #0
  Filled 2023-05-31: qty 5, 7d supply, fill #0
  Filled 2023-08-10: qty 15, 4d supply, fill #0
  Filled 2023-11-01: qty 15, 30d supply, fill #0
  Filled 2024-03-06: qty 5, 4d supply, fill #0

## 2023-05-25 ENCOUNTER — Other Ambulatory Visit (HOSPITAL_COMMUNITY): Payer: Self-pay

## 2023-05-30 ENCOUNTER — Other Ambulatory Visit (HOSPITAL_COMMUNITY): Payer: Self-pay

## 2023-05-30 ENCOUNTER — Other Ambulatory Visit (HOSPITAL_BASED_OUTPATIENT_CLINIC_OR_DEPARTMENT_OTHER): Payer: Self-pay

## 2023-05-30 MED ORDER — TRULICITY 0.75 MG/0.5ML ~~LOC~~ SOAJ
0.7500 mg | SUBCUTANEOUS | 3 refills | Status: AC
  Filled 2023-05-30 – 2023-05-31 (×3): qty 6, 84d supply, fill #0
  Filled 2023-08-20: qty 6, 84d supply, fill #1
  Filled 2023-11-14: qty 6, 84d supply, fill #2

## 2023-05-31 ENCOUNTER — Other Ambulatory Visit (HOSPITAL_BASED_OUTPATIENT_CLINIC_OR_DEPARTMENT_OTHER): Payer: Self-pay

## 2023-05-31 ENCOUNTER — Other Ambulatory Visit (HOSPITAL_COMMUNITY): Payer: Self-pay

## 2023-06-04 ENCOUNTER — Other Ambulatory Visit (HOSPITAL_COMMUNITY): Payer: Self-pay

## 2023-06-09 ENCOUNTER — Encounter (INDEPENDENT_AMBULATORY_CARE_PROVIDER_SITE_OTHER): Payer: Medicare PPO | Admitting: Ophthalmology

## 2023-06-09 DIAGNOSIS — E119 Type 2 diabetes mellitus without complications: Secondary | ICD-10-CM | POA: Diagnosis not present

## 2023-06-09 DIAGNOSIS — H2513 Age-related nuclear cataract, bilateral: Secondary | ICD-10-CM | POA: Diagnosis not present

## 2023-06-09 DIAGNOSIS — H35372 Puckering of macula, left eye: Secondary | ICD-10-CM | POA: Diagnosis not present

## 2023-06-09 DIAGNOSIS — H31013 Macula scars of posterior pole (postinflammatory) (post-traumatic), bilateral: Secondary | ICD-10-CM | POA: Diagnosis not present

## 2023-06-10 ENCOUNTER — Other Ambulatory Visit (HOSPITAL_BASED_OUTPATIENT_CLINIC_OR_DEPARTMENT_OTHER): Payer: Self-pay

## 2023-06-10 ENCOUNTER — Encounter (INDEPENDENT_AMBULATORY_CARE_PROVIDER_SITE_OTHER): Payer: Medicare PPO | Admitting: Ophthalmology

## 2023-06-10 DIAGNOSIS — Z7985 Long-term (current) use of injectable non-insulin antidiabetic drugs: Secondary | ICD-10-CM | POA: Diagnosis not present

## 2023-06-10 DIAGNOSIS — I10 Essential (primary) hypertension: Secondary | ICD-10-CM

## 2023-06-10 DIAGNOSIS — H43813 Vitreous degeneration, bilateral: Secondary | ICD-10-CM | POA: Diagnosis not present

## 2023-06-10 DIAGNOSIS — H35033 Hypertensive retinopathy, bilateral: Secondary | ICD-10-CM | POA: Diagnosis not present

## 2023-06-10 DIAGNOSIS — E113391 Type 2 diabetes mellitus with moderate nonproliferative diabetic retinopathy without macular edema, right eye: Secondary | ICD-10-CM

## 2023-06-10 DIAGNOSIS — E113312 Type 2 diabetes mellitus with moderate nonproliferative diabetic retinopathy with macular edema, left eye: Secondary | ICD-10-CM | POA: Diagnosis not present

## 2023-06-13 ENCOUNTER — Ambulatory Visit: Payer: Medicare PPO | Admitting: Nutrition

## 2023-06-24 ENCOUNTER — Other Ambulatory Visit (HOSPITAL_COMMUNITY): Payer: Self-pay

## 2023-06-25 ENCOUNTER — Other Ambulatory Visit (HOSPITAL_COMMUNITY): Payer: Self-pay

## 2023-06-27 ENCOUNTER — Telehealth: Payer: Self-pay | Admitting: Cardiology

## 2023-06-27 ENCOUNTER — Encounter: Payer: Self-pay | Admitting: Cardiology

## 2023-06-27 NOTE — Telephone Encounter (Signed)
Spoke with pt and advised this office did not discontinue his levothyroxine.  Advised to contact PCP to discuss with that office and to request a refill.  Pt states understanding.

## 2023-06-27 NOTE — Telephone Encounter (Signed)
Pt c/o medication issue:  1. Name of Medication:   levothyroxine (SYNTHROID) 112 MCG tablet    2. How are you currently taking this medication (dosage and times per day)?   3. Are you having a reaction (difficulty breathing--STAT)?   4. What is your medication issue? Patient is requesting call back to discuss this medication and his pharmacy stating that this medication was discontinued by cardiologist on 12/03. Requesting call back to get clarification.

## 2023-06-28 ENCOUNTER — Other Ambulatory Visit (HOSPITAL_COMMUNITY): Payer: Self-pay

## 2023-06-28 MED ORDER — LEVOTHYROXINE SODIUM 112 MCG PO TABS
112.0000 ug | ORAL_TABLET | Freq: Every morning | ORAL | 1 refills | Status: DC
  Filled 2023-06-28: qty 90, 90d supply, fill #0
  Filled 2023-09-20 (×2): qty 90, 90d supply, fill #1

## 2023-06-28 NOTE — Telephone Encounter (Signed)
Error

## 2023-06-29 ENCOUNTER — Other Ambulatory Visit (HOSPITAL_COMMUNITY): Payer: Self-pay

## 2023-07-08 ENCOUNTER — Encounter (INDEPENDENT_AMBULATORY_CARE_PROVIDER_SITE_OTHER): Payer: Medicare PPO | Admitting: Ophthalmology

## 2023-07-08 DIAGNOSIS — H2513 Age-related nuclear cataract, bilateral: Secondary | ICD-10-CM | POA: Diagnosis not present

## 2023-07-08 DIAGNOSIS — H43813 Vitreous degeneration, bilateral: Secondary | ICD-10-CM

## 2023-07-08 DIAGNOSIS — H35033 Hypertensive retinopathy, bilateral: Secondary | ICD-10-CM | POA: Diagnosis not present

## 2023-07-08 DIAGNOSIS — Z7985 Long-term (current) use of injectable non-insulin antidiabetic drugs: Secondary | ICD-10-CM | POA: Diagnosis not present

## 2023-07-08 DIAGNOSIS — H33303 Unspecified retinal break, bilateral: Secondary | ICD-10-CM | POA: Diagnosis not present

## 2023-07-08 DIAGNOSIS — E113312 Type 2 diabetes mellitus with moderate nonproliferative diabetic retinopathy with macular edema, left eye: Secondary | ICD-10-CM | POA: Diagnosis not present

## 2023-07-08 DIAGNOSIS — I10 Essential (primary) hypertension: Secondary | ICD-10-CM

## 2023-07-08 DIAGNOSIS — E113391 Type 2 diabetes mellitus with moderate nonproliferative diabetic retinopathy without macular edema, right eye: Secondary | ICD-10-CM

## 2023-07-11 ENCOUNTER — Other Ambulatory Visit (HOSPITAL_BASED_OUTPATIENT_CLINIC_OR_DEPARTMENT_OTHER): Payer: Self-pay

## 2023-07-11 ENCOUNTER — Other Ambulatory Visit: Payer: Self-pay | Admitting: Cardiology

## 2023-07-12 ENCOUNTER — Other Ambulatory Visit: Payer: Self-pay

## 2023-07-12 ENCOUNTER — Ambulatory Visit (HOSPITAL_COMMUNITY)
Admission: RE | Admit: 2023-07-12 | Discharge: 2023-07-12 | Disposition: A | Discharge status: Home / Self Care | Payer: Medicare PPO | Source: Ambulatory Visit | Attending: Internal Medicine | Admitting: Internal Medicine

## 2023-07-12 ENCOUNTER — Other Ambulatory Visit (HOSPITAL_COMMUNITY): Payer: Self-pay

## 2023-07-12 ENCOUNTER — Encounter (HOSPITAL_COMMUNITY): Payer: Self-pay | Admitting: Internal Medicine

## 2023-07-12 VITALS — BP 118/72 | HR 72 | Ht 69.0 in | Wt 176.6 lb

## 2023-07-12 DIAGNOSIS — I4819 Other persistent atrial fibrillation: Secondary | ICD-10-CM | POA: Insufficient documentation

## 2023-07-12 DIAGNOSIS — Z7901 Long term (current) use of anticoagulants: Secondary | ICD-10-CM | POA: Diagnosis not present

## 2023-07-12 DIAGNOSIS — D6869 Other thrombophilia: Secondary | ICD-10-CM | POA: Insufficient documentation

## 2023-07-12 DIAGNOSIS — Z79899 Other long term (current) drug therapy: Secondary | ICD-10-CM | POA: Insufficient documentation

## 2023-07-12 DIAGNOSIS — Z5181 Encounter for therapeutic drug level monitoring: Secondary | ICD-10-CM | POA: Insufficient documentation

## 2023-07-12 LAB — BASIC METABOLIC PANEL
Anion gap: 8 (ref 5–15)
BUN: 23 mg/dL (ref 8–23)
CO2: 26 mmol/L (ref 22–32)
Calcium: 9.4 mg/dL (ref 8.9–10.3)
Chloride: 104 mmol/L (ref 98–111)
Creatinine, Ser: 1.05 mg/dL (ref 0.61–1.24)
GFR, Estimated: >60 mL/min (ref >60)
Glucose, Bld: 126 mg/dL — ABNORMAL HIGH (ref 70–99)
Potassium: 5.2 mmol/L — ABNORMAL HIGH (ref 3.5–5.1)
Sodium: 138 mmol/L (ref 135–145)

## 2023-07-12 LAB — CBC
HCT: 44.3 % (ref 39.0–52.0)
Hemoglobin: 14.7 g/dL (ref 13.0–17.0)
MCH: 31.1 pg (ref 26.0–34.0)
MCHC: 33.2 g/dL (ref 30.0–36.0)
MCV: 93.7 fL (ref 80.0–100.0)
Platelets: 203 10*3/uL (ref 150–400)
RBC: 4.73 MIL/uL (ref 4.22–5.81)
RDW: 13.4 % (ref 11.5–15.5)
WBC: 7.8 10*3/uL (ref 4.0–10.5)
nRBC: 0 % (ref 0.0–0.2)

## 2023-07-12 LAB — MAGNESIUM: Magnesium: 2.3 mg/dL (ref 1.7–2.4)

## 2023-07-12 MED ORDER — DOFETILIDE 500 MCG PO CAPS
500.0000 ug | ORAL_CAPSULE | Freq: Two times a day (BID) | ORAL | 1 refills | Status: DC
  Filled 2023-07-12 (×2): qty 180, 90d supply, fill #0
  Filled 2023-09-20 – 2023-09-27 (×4): qty 180, 90d supply, fill #1

## 2023-07-12 NOTE — Progress Notes (Signed)
 Primary Care Physician: Anthony Carlin Redbird, Cole Referring Physician: Dr. Kelsie Cole: Dr. HILARIO Cole    Anthony Cole is a 73 y.o. male with a h/o afib s/p ablation 08/25/17. He was being seen in the office as pt has noted return of afib since 2 days after ablation. He felt  he was tolerating his afib better since ablation. He was instructed to increase the metoprolol  and his was rate controlled. He denied any swallowing difficulties and had minor bruising at the rt groin site. He was set up for successful cardioversion.   He returns to afib clinic, unfortunately has returned to afib. He believes he held in normal rhythm for about a week. No known trigger. Continues to take flecainde 100 mg bid, and xarelto , no missed doses.   F/u in afib clinic 5/10. Remains in atrial flutter at 84 bpm  He called in last week stating that the 2nd  cardioversion did not hold in SR. Discussed with Anthony Cole and he said to offer pt short term amiodarone  or Tikosyn . We discussed this in clinic in detail and he believes he would rather try Tikosyn . Seems to be tolerating a flutter well.  F/u in afib clinic, 5/31. He was admitted for Tikosyn , and did convert to SR but returned to afib prior to d/c. The plan on d/c was if he continued in afib to cardiovert on tikosyn  and see if he would maintain SR. Pt is in agreement. He is taking tikosyn  and xareoto without missed doses.  F/u in afib clinic 02/14/18. He was in a Cole office recently and irregular HB was noted. He is found to be in afib today. He has noticed some palpitations at times. He is tired when he is in afib, and tired when he is in SR.  Options discussed to restore SR.    F/u in afib clinic 06/14/2018. He is having some afib but overall is feeling weill, exercising and carrying on with life. He has been offered another ablation with Anthony Cole but does not know if he is ready for this. He continues on tikosyn .  F/u in afib clinic, 08/14/19. He is out of  rhythm today but did not know until it was brought to his attention. He feels that he goes in and out. Much more tolerable of it after ablation and tikoysn. He has had both covid shots and feels well today. Goes to the gym on a regular basis. Continues  with xarelto  20 mg qd for a CHA2DS2VASc score of 3.  F/u in afib clinic, 02/13/20. He is in atrial flutter today. He feels he may be out of rhythm more since July. He wore a monitor last June that showed 32% afib burden. He does not feel the afib that he use to. He continues on Tikosyn . No bleeding issues.   F/u in afib clinic, 10/08/20. Pt was out of rhythm on last visit in September of last year  and the plan was to wear a monitor to see if persistent and if so, discuss stopping tikosyn  as he did not feel any different in afib. He called back to office and declined the monitor. Since then he has been out of rhythm when seen by other providers so I feel he has been persistent for the last year. Last echo showed severely dilated left atrium at 5.20 cm with preserved EF. I feel his ability to restore and maintain  SR is limited due to left atrial size and the fact that he has been  in afib for the last year. He is hesitant to stop tikosyn  as he feels it has some inherent benefit to his heart other than trying to keep him in SR. He would like to talk to Dr. Kelsie re repeat ablation,  but I do not think he is the best candidate and this was discussed with the pt.  He also tells me that his brother that lived [place] of [place] died recently with a stroke, with h/o  Afib, that apparently stopped taking his xarelto . He has lost around 15 lbs being more active cleaning out his house and his rental storage unit.   F/u in Afib clinic, 07/12/23. Patient is here for Tikosyn  surveillance. He is currently in Afib. He notes to have intermittent episodes of Afib but remains mostly in NSR. No missed doses of Tikosyn  500 mcg BID or Xarelto  20 mg daily.   Today, he denies symptoms of  palpitations, chest pain, shortness of breath, orthopnea, PND, lower extremity edema, dizziness, presyncope, syncope, or neurologic sequela.+ fatigue. The patient is tolerating medications without difficulties and is otherwise without complaint today.   Past Medical History:  Diagnosis Date   Arthritis    Atrial fibrillation (HCC)    Diabetes mellitus without complication (HCC)    type 2   Hyperlipidemia    Hypertension    Hypothyroidism    Obesity (BMI 30-39.9) 01/25/2016   OSA on CPAP 07/16/2014   Moderate with AHI 19/hr on CPAP at 8cm H2O   Visit for monitoring Tikosyn  therapy 10/25/2017   Past Surgical History:  Procedure Laterality Date   ATRIAL FIBRILLATION ABLATION N/A 08/25/2017   Procedure: ATRIAL FIBRILLATION ABLATION;  Surgeon: Anthony Cole;  [facility]: [facility];  Service: Cardiovascular;  Laterality: N/A;   CARDIOVERSION  11/04/2011   Procedure: CARDIOVERSION;  Surgeon: Anthony LELON Claudene DOUGLAS, Cole;  Location: [facility] OR;  Service: Cardiovascular;  Laterality: N/A;   CARDIOVERSION N/A 07/02/2013   Procedure: CARDIOVERSION;  Surgeon: Anthony LELON Claudene DOUGLAS, Cole;  Location: [facility] ENDOSCOPY;  Service: Cardiovascular;  Laterality: N/A;  10:07 elective cardioversion Lido 40mg , Propofol  60mg ,IV...200 joules, synched, pt presently in A flutter,...cardioverted to NSR...   CARDIOVERSION N/A 09/19/2013   Procedure: CARDIOVERSION;  Surgeon: Anthony LELON Claudene DOUGLAS, Cole;  Location: [facility] ENDOSCOPY;  Service: Cardiovascular;  Laterality: N/A;   CARDIOVERSION N/A 08/13/2014   Procedure: CARDIOVERSION;  Surgeon: Anthony VEAR Moose, Cole;  Location: [facility] ENDOSCOPY;  Service: Cardiovascular;  Laterality: N/A;   CARDIOVERSION N/A 05/23/2017   Procedure: CARDIOVERSION;  Surgeon: Anthony Aleene PARAS, Cole;  Location: [facility] ENDOSCOPY;  Service: Cardiovascular;  Laterality: N/A;   CARDIOVERSION N/A 09/09/2017   Procedure: CARDIOVERSION;  Surgeon: Anthony Vina GAILS, Cole;  Location: [facility] ENDOSCOPY;  Service: Cardiovascular;  Laterality: N/A;    CARDIOVERSION N/A 09/30/2017   Procedure: CARDIOVERSION;  Surgeon: Charls Pearla LABOR, Cole;  Location: [facility] ENDOSCOPY;  Service: Cardiovascular;  Laterality: N/A;   EYE SURGERY     FINGER FRACTURE SURGERY Left when I was a kid   FRACTURE SURGERY     KNEE ARTHROSCOPY W/ DEBRIDEMENT Bilateral    RETINAL LASER PROCEDURE Bilateral    tears on both; not detachments   SHOULDER ARTHROSCOPY W/ ROTATOR CUFF REPAIR Bilateral    STAPEDES SURGERY Right    TONSILLECTOMY     TYMPANOPLASTY Bilateral    skin grafts to ear drums    Current Outpatient Medications  Medication Sig Dispense Refill   ACCU-CHEK GUIDE test strip CHECK BLOOD SUGAR ONCE A DAY. 50  Accu-Chek Softclix Lancets lancets Use to check blood glucose twice daily. 100 each 4   acyclovir  (ZOVIRAX ) 200 MG capsule Take 200 mg by mouth 2 (two) times daily as needed (first sign of fever blister).     acyclovir  cream (ZOVIRAX ) 5 % Apply  topically to lip 3 (three) times daily as needed. 5 g 1   acyclovir  cream (ZOVIRAX ) 5 % Apply to affected area Five times a day for 4 days 15 g 1   Blood Glucose Monitoring Suppl (ACCU-CHEK GUIDE) w/Device KIT 1 each as directed     Cholecalciferol 50 MCG (2000 UT) TABS Take 2,000 Units by mouth daily.     ciprofloxacin  (CILOXAN ) 0.3 % ophthalmic solution INSTILL ONE DROP INTO LEFT EYE 4 TIMES A DAY FOR 2 DAYS AFTER EACH MONTHLY EYE INJECTION 2.5 mL 12   dofetilide  (TIKOSYN ) 500 MCG capsule Take 1 capsule (500 mcg total) by mouth 2 (two) times daily. 180 capsule 1   doxycycline  (VIBRA -TABS) 100 MG tablet Take 1 tablet (100 mg total) by mouth 2 (two) times daily. 180 tablet 4   Dulaglutide  (TRULICITY ) 0.75 MG/0.5ML SOAJ Inject 0.75 mg into the skin once a week. 6 mL 3   empagliflozin  (JARDIANCE ) 10 MG TABS tablet Take 1 tablet (10 mg total) by mouth daily. 90 tablet 4   glucose blood (ACCU-CHEK GUIDE) test strip Use to test blood sugars twice daily. 100 each 4   levothyroxine  (SYNTHROID ) 112 MCG tablet  Take 1 tablet (112 mcg total) by mouth in the morning on an empty stomach. 90 tablet 1   losartan  (COZAAR ) 100 MG tablet Take 1 tablet (100 mg total) by mouth daily. 90 tablet 0   metoprolol  succinate (TOPROL -XL) 50 MG 24 hr tablet Take 1 tablet (50 mg total) by mouth daily. Take with or immediately following a meal. 90 tablet 1   metroNIDAZOLE  (METROGEL ) 0.75 % gel Apply to the face once daily 45 g 2   Multiple Vitamins-Minerals (PRESERVISION AREDS 2+MULTI VIT PO) Take 1 tablet by mouth in the morning and at bedtime.     rivaroxaban  (XARELTO ) 20 MG TABS tablet Take 1 tablet (20 mg total) by mouth daily 90 tablet 1   rosuvastatin  (CRESTOR ) 40 MG tablet Take 1 tablet (40 mg total) by mouth daily. 90 tablet 3   spironolactone  (ALDACTONE ) 25 MG tablet Take 1 tablet (25 mg total) by mouth daily. 90 tablet 4   tretinoin  (RETIN-A ) 0.025 % cream Apply to face at bedtime as directed 45 g 1   valACYclovir  (VALTREX ) 1000 MG tablet Take 2 tablets (2,000 mg total) by mouth every 12 (twelve) hours. 4 tablet 1   Current Facility-Administered Medications  Medication Dose Route Frequency Provider Last Rate Last Admin   0.9 %  sodium chloride  infusion  250 mL Intravenous Continuous Claudene Anthony ORN, Cole       sodium chloride  flush (NS) 0.9 % injection 3 mL  3 mL Intravenous Q12H Claudene Anthony ORN, Cole       sodium chloride  flush (NS) 0.9 % injection 3 mL  3 mL Intravenous PRN Claudene Anthony ORN, Cole        Allergies  Allergen Reactions   Gabapentin Other (See Comments)    Causes Extreme sedation (Drowsy)    Other     Not sure of name of med-for overactive bladder-caused some adverse reactions, stopped taking immediately-per patient   Androgel [Testosterone]     Pt preference    Flomax [Tamsulosin Hcl] Other (See Comments)  drowsiness   Lipitor [Atorvastatin] Other (See Comments)    Responds better with Crestor     ROS- All systems are reviewed and negative except as per the HPI above  Physical Exam: Vitals:    07/12/23 1002  BP: 118/72  Pulse: 72  Weight: 80.1 kg  Height: 5' 9 (1.753 m)    Wt Readings from Last 3 Encounters:  07/12/23 80.1 kg  05/10/23 78.7 kg  04/26/23 78.7 kg    Labs: Lab Results  Component Value Date   NA 138 06/15/2022   K 5.0 06/15/2022   CL 104 06/15/2022   CO2 27 06/15/2022   GLUCOSE 105 (H) 06/15/2022   BUN 28 (H) 06/15/2022   CREATININE 0.88 06/25/2022   CALCIUM  9.2 06/15/2022   MG 2.3 07/05/2022   Lab Results  Component Value Date   INR 1.4 (H) 05/18/2017   Lab Results  Component Value Date   CHOL 131 08/10/2018   HDL 39 (L) 08/10/2018   LDLCALC 74 08/10/2018   TRIG 91 08/10/2018   GEN- The patient is well appearing, alert and oriented x 3 today.   Neck - no JVD or carotid bruit noted Lungs- Clear to ausculation bilaterally, normal work of breathing Heart- Irregular rate and rhythm, no murmurs, rubs or gallops, PMI not laterally displaced Extremities- no clubbing, cyanosis, or edema Skin - no rash or ecchymosis noted  EKG- Vent. rate 72 BPM PR interval * ms QRS duration 80 ms QT/QTcB 388/424 ms P-R-T axes * 50 -41 Atrial fibrillation Nonspecific ST and T wave abnormality Abnormal ECG When compared with ECG of 04-Jan-2023 13:46, PREVIOUS ECG IS PRESENT  CHA2DS2-VASc Score = 6  The patient's score is based upon: CHF History: 0 HTN History: 1 Diabetes History: 1 Stroke History: 2 Vascular Disease History: 1 Age Score: 1 Gender Score: 0       Assessment and Plan: 1.Persisitent afib/flutter s/p ablation 08/25/17  Cardioversion x 2, s/p ablation, with ERAF Then admitted for Tikosyn , initially converted but then with ERAF.  Patient is currently in Afib. He notes to be paroxysmal but overall remains mostly in NSR. I spoke with patient regarding pulse field ablation but he expresses reluctance for procedure. I advised if he wishes to discuss procedure further can do so with Dr. Inocencio.   High risk medication monitoring (ICD10:  [id number]) Patient requires ongoing monitoring for anti-arrhythmic medication which has the potential to cause life threatening arrhythmias or AV block. Qtc stable. Continue Tikosyn  500 mcg BID. Bmet and mag drawn today.  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 CBC drawnt oday.     Follow up in 6 months with Dr. Inocencio.    Dorn Heinrich, PA-C Afib Clinic [facility] [address] [place], [place] [address] [phone]

## 2023-07-13 ENCOUNTER — Other Ambulatory Visit (HOSPITAL_COMMUNITY): Payer: Self-pay

## 2023-07-13 DIAGNOSIS — I4891 Unspecified atrial fibrillation: Secondary | ICD-10-CM

## 2023-07-15 DIAGNOSIS — R109 Unspecified abdominal pain: Secondary | ICD-10-CM | POA: Diagnosis not present

## 2023-07-15 DIAGNOSIS — Z6825 Body mass index (BMI) 25.0-25.9, adult: Secondary | ICD-10-CM | POA: Diagnosis not present

## 2023-07-15 DIAGNOSIS — E875 Hyperkalemia: Secondary | ICD-10-CM | POA: Diagnosis not present

## 2023-07-15 DIAGNOSIS — E11319 Type 2 diabetes mellitus with unspecified diabetic retinopathy without macular edema: Secondary | ICD-10-CM | POA: Diagnosis not present

## 2023-07-15 DIAGNOSIS — E113391 Type 2 diabetes mellitus with moderate nonproliferative diabetic retinopathy without macular edema, right eye: Secondary | ICD-10-CM | POA: Diagnosis not present

## 2023-07-17 ENCOUNTER — Other Ambulatory Visit: Payer: Self-pay | Admitting: Cardiology

## 2023-07-18 ENCOUNTER — Other Ambulatory Visit (HOSPITAL_COMMUNITY): Payer: Self-pay

## 2023-07-19 ENCOUNTER — Other Ambulatory Visit (HOSPITAL_COMMUNITY): Payer: Self-pay

## 2023-07-19 MED ORDER — METOPROLOL SUCCINATE ER 50 MG PO TB24
50.0000 mg | ORAL_TABLET | Freq: Every day | ORAL | 3 refills | Status: DC
  Filled 2023-07-19: qty 90, 90d supply, fill #0

## 2023-07-24 ENCOUNTER — Other Ambulatory Visit (HOSPITAL_COMMUNITY): Payer: Self-pay

## 2023-07-25 ENCOUNTER — Other Ambulatory Visit (HOSPITAL_BASED_OUTPATIENT_CLINIC_OR_DEPARTMENT_OTHER): Payer: Self-pay

## 2023-07-27 DIAGNOSIS — E11319 Type 2 diabetes mellitus with unspecified diabetic retinopathy without macular edema: Secondary | ICD-10-CM | POA: Diagnosis not present

## 2023-07-27 DIAGNOSIS — E875 Hyperkalemia: Secondary | ICD-10-CM | POA: Diagnosis not present

## 2023-07-29 ENCOUNTER — Other Ambulatory Visit: Payer: Self-pay | Admitting: Cardiology

## 2023-07-29 ENCOUNTER — Other Ambulatory Visit (HOSPITAL_BASED_OUTPATIENT_CLINIC_OR_DEPARTMENT_OTHER): Payer: Self-pay

## 2023-07-29 ENCOUNTER — Other Ambulatory Visit (HOSPITAL_COMMUNITY): Payer: Self-pay

## 2023-07-29 DIAGNOSIS — R109 Unspecified abdominal pain: Secondary | ICD-10-CM | POA: Diagnosis not present

## 2023-07-29 DIAGNOSIS — E875 Hyperkalemia: Secondary | ICD-10-CM | POA: Diagnosis not present

## 2023-07-29 DIAGNOSIS — I4891 Unspecified atrial fibrillation: Secondary | ICD-10-CM

## 2023-07-29 MED ORDER — RIVAROXABAN 20 MG PO TABS
20.0000 mg | ORAL_TABLET | Freq: Every day | ORAL | 1 refills | Status: DC
  Filled 2023-07-29: qty 90, 90d supply, fill #0
  Filled 2023-10-22: qty 90, 90d supply, fill #1

## 2023-07-29 NOTE — Telephone Encounter (Signed)
Prescription refill request for Xarelto received.  Indication: Afib  Last office visit: 07/12/23 Nelva Bush)  Weight: 80.1kg Age: 73 Scr: 1.05 (07/12/23)  CrCl: 73.90ml/min  Appropriate dose. Refill sent.

## 2023-08-04 ENCOUNTER — Other Ambulatory Visit: Payer: Self-pay | Admitting: Cardiology

## 2023-08-04 ENCOUNTER — Other Ambulatory Visit (HOSPITAL_COMMUNITY): Payer: Self-pay

## 2023-08-04 MED ORDER — LOSARTAN POTASSIUM 100 MG PO TABS
100.0000 mg | ORAL_TABLET | Freq: Every day | ORAL | 2 refills | Status: DC
  Filled 2023-08-04: qty 90, 90d supply, fill #0
  Filled 2023-11-01: qty 90, 90d supply, fill #1
  Filled 2024-02-03: qty 90, 90d supply, fill #2

## 2023-08-05 ENCOUNTER — Other Ambulatory Visit (HOSPITAL_COMMUNITY): Payer: Self-pay

## 2023-08-05 ENCOUNTER — Other Ambulatory Visit: Payer: Self-pay

## 2023-08-05 ENCOUNTER — Encounter (INDEPENDENT_AMBULATORY_CARE_PROVIDER_SITE_OTHER): Payer: Medicare PPO | Admitting: Ophthalmology

## 2023-08-05 DIAGNOSIS — H33303 Unspecified retinal break, bilateral: Secondary | ICD-10-CM | POA: Diagnosis not present

## 2023-08-05 DIAGNOSIS — I10 Essential (primary) hypertension: Secondary | ICD-10-CM | POA: Diagnosis not present

## 2023-08-05 DIAGNOSIS — Z7985 Long-term (current) use of injectable non-insulin antidiabetic drugs: Secondary | ICD-10-CM

## 2023-08-05 DIAGNOSIS — H35033 Hypertensive retinopathy, bilateral: Secondary | ICD-10-CM

## 2023-08-05 DIAGNOSIS — E113312 Type 2 diabetes mellitus with moderate nonproliferative diabetic retinopathy with macular edema, left eye: Secondary | ICD-10-CM | POA: Diagnosis not present

## 2023-08-05 DIAGNOSIS — H43813 Vitreous degeneration, bilateral: Secondary | ICD-10-CM | POA: Diagnosis not present

## 2023-08-05 DIAGNOSIS — E113291 Type 2 diabetes mellitus with mild nonproliferative diabetic retinopathy without macular edema, right eye: Secondary | ICD-10-CM

## 2023-08-08 ENCOUNTER — Other Ambulatory Visit (HOSPITAL_COMMUNITY): Payer: Self-pay

## 2023-08-08 MED ORDER — JARDIANCE 10 MG PO TABS
ORAL_TABLET | Freq: Every day | ORAL | 0 refills | Status: AC
  Filled 2023-08-08: qty 90, 90d supply, fill #0

## 2023-08-09 ENCOUNTER — Other Ambulatory Visit: Payer: Self-pay

## 2023-08-09 ENCOUNTER — Other Ambulatory Visit (HOSPITAL_COMMUNITY): Payer: Self-pay

## 2023-08-09 ENCOUNTER — Other Ambulatory Visit: Payer: Self-pay | Admitting: Cardiology

## 2023-08-09 NOTE — Progress Notes (Unsigned)
 Cardiology Office Note:    Date:  08/10/2023   ID:  CASIMIR BARCELLOS, DOB Mar 09, 1951, MRN [id number]  PCP:  Daisy Floro, MD  Cardiologist: Donato Schultz MD  Referring MD: Daisy Floro, MD   Chief Complaint  Patient presents with   Sleep Apnea   Hypertension    History of Present Illness:    Anthony Cole is a 73 y.o. male with a hx of moderate OSA with an AHI of 19 events per hour.  He is on CPAP at 8cm H2O.   He also has a hx of PAF, DM2, HLD, HTN and is followed in afib clinic.   He is doing well with his PAP device and thinks that he has gotten used to it.  He tolerates the mask and feels the pressure is adequate.  Since going on PAP He feels rested in the am but does have some sleepiness at times and will nap during the day.  He denies any significant mouth or nasal dryness or nasal congestion.  He does not think that he snores.    Past Medical History:  Diagnosis Date   Arthritis    Atrial fibrillation (HCC)    Diabetes mellitus without complication (HCC)    type 2   Hyperlipidemia    Hypertension    Hypothyroidism    Obesity (BMI 30-39.9) 01/25/2016   OSA on CPAP 07/16/2014   Moderate with AHI 19/hr on CPAP at 8cm H2O   Visit for monitoring Tikosyn therapy 10/25/2017    Past Surgical History:  Procedure Laterality Date   ATRIAL FIBRILLATION ABLATION N/A 08/25/2017   Procedure: ATRIAL FIBRILLATION ABLATION;  Surgeon: Hillis Range, MD;  Location: [facility] INVASIVE CV LAB;  Service: Cardiovascular;  Laterality: N/A;   CARDIOVERSION  11/04/2011   Procedure: CARDIOVERSION;  Surgeon: Lesleigh Noe, MD;  Location: [facility] OR;  Service: Cardiovascular;  Laterality: N/A;   CARDIOVERSION N/A 07/02/2013   Procedure: CARDIOVERSION;  Surgeon: Lesleigh Noe, MD;  Location: [facility] ENDOSCOPY;  Service: Cardiovascular;  Laterality: N/A;  10:07 elective cardioversion Lido 40mg , Propofol 60mg ,IV...200 joules, synched, pt presently in A flutter,...cardioverted to NSR...    CARDIOVERSION N/A 09/19/2013   Procedure: CARDIOVERSION;  Surgeon: Lesleigh Noe, MD;  Location: [facility] ENDOSCOPY;  Service: Cardiovascular;  Laterality: N/A;   CARDIOVERSION N/A 08/13/2014   Procedure: CARDIOVERSION;  Surgeon: Lars Masson, MD;  Location: [facility] ENDOSCOPY;  Service: Cardiovascular;  Laterality: N/A;   CARDIOVERSION N/A 05/23/2017   Procedure: CARDIOVERSION;  Surgeon: Elease Hashimoto Deloris Ping, MD;  Location: [facility] ENDOSCOPY;  Service: Cardiovascular;  Laterality: N/A;   CARDIOVERSION N/A 09/09/2017   Procedure: CARDIOVERSION;  Surgeon: Pricilla Riffle, MD;  Location: [facility] ENDOSCOPY;  Service: Cardiovascular;  Laterality: N/A;   CARDIOVERSION N/A 09/30/2017   Procedure: CARDIOVERSION;  Surgeon: Laqueta Linden, MD;  Location: [facility] ENDOSCOPY;  Service: Cardiovascular;  Laterality: N/A;   EYE SURGERY     FINGER FRACTURE SURGERY Left "when I was a kid"   FRACTURE SURGERY     KNEE ARTHROSCOPY W/ DEBRIDEMENT Bilateral    RETINAL LASER PROCEDURE Bilateral    "tears on both; not detachments"   SHOULDER ARTHROSCOPY W/ ROTATOR CUFF REPAIR Bilateral    STAPEDES SURGERY Right    TONSILLECTOMY     TYMPANOPLASTY Bilateral    "skin grafts to ear drums"    Current Medications: Current Meds  Medication Sig   ACCU-CHEK GUIDE test strip CHECK BLOOD SUGAR ONCE A DAY. 50   Accu-Chek  Softclix Lancets lancets Use to check blood glucose twice daily.   acyclovir (ZOVIRAX) 200 MG capsule Take 200 mg by mouth 2 (two) times daily as needed (first sign of fever blister).   acyclovir cream (ZOVIRAX) 5 % Apply  topically to lip 3 (three) times daily as needed.   acyclovir cream (ZOVIRAX) 5 % Apply to affected area Five times a day for 4 days   Blood Glucose Monitoring Suppl (ACCU-CHEK GUIDE) w/Device KIT 1 each as directed   Cholecalciferol 50 MCG (2000 UT) TABS Take 2,000 Units by mouth daily.   ciprofloxacin (CILOXAN) 0.3 % ophthalmic solution INSTILL ONE DROP INTO LEFT EYE 4 TIMES A DAY FOR 2 DAYS AFTER EACH  MONTHLY EYE INJECTION   dofetilide (TIKOSYN) 500 MCG capsule Take 1 capsule (500 mcg total) by mouth 2 (two) times daily.   doxycycline (VIBRA-TABS) 100 MG tablet Take 1 tablet (100 mg total) by mouth 2 (two) times daily.   Dulaglutide (TRULICITY) 0.75 MG/0.5ML SOAJ Inject 0.75 mg into the skin once a week.   empagliflozin (JARDIANCE) 10 MG TABS tablet Take 1 tablet (10 mg total) by mouth daily.   empagliflozin (JARDIANCE) 10 MG TABS tablet Take 1 tablet orally daily.   glucose blood (ACCU-CHEK GUIDE) test strip Use to test blood sugars twice daily.   levothyroxine (SYNTHROID) 112 MCG tablet Take 1 tablet (112 mcg total) by mouth in the morning on an empty stomach.   losartan (COZAAR) 100 MG tablet Take 1 tablet (100 mg total) by mouth daily.   metoprolol succinate (TOPROL-XL) 50 MG 24 hr tablet Take 1 tablet (50 mg total) by mouth daily. Take with or immediately following a meal.   metroNIDAZOLE (METROGEL) 0.75 % gel Apply to the face once daily   Multiple Vitamins-Minerals (PRESERVISION AREDS 2+MULTI VIT PO) Take 1 tablet by mouth in the morning and at bedtime.   rivaroxaban (XARELTO) 20 MG TABS tablet Take 1 tablet (20 mg total) by mouth daily   rosuvastatin (CRESTOR) 40 MG tablet Take 1 tablet (40 mg total) by mouth daily.   spironolactone (ALDACTONE) 25 MG tablet Take 1 tablet (25 mg total) by mouth daily.   tretinoin (RETIN-A) 0.025 % cream Apply to face at bedtime as directed   valACYclovir (VALTREX) 1000 MG tablet Take 2 tablets (2,000 mg total) by mouth every 12 (twelve) hours.   Current Facility-Administered Medications for the 08/10/23 encounter (Office Visit) with Quintella Reichert, MD  Medication   0.9 %  sodium chloride infusion   sodium chloride flush (NS) 0.9 % injection 3 mL   sodium chloride flush (NS) 0.9 % injection 3 mL     Allergies:   Gabapentin, Other, Androgel [testosterone], Flomax [tamsulosin hcl], and Lipitor [atorvastatin]   Social History   Socioeconomic History    Marital status: Single    Spouse name: Not on file   Number of children: Not on file   Years of education: Not on file   Highest education level: Not on file  Occupational History   Not on file  Tobacco Use   Smoking status: Never   Smokeless tobacco: Never   Tobacco comments:    Never smoked 07/12/23  Vaping Use   Vaping status: Never Used  Substance and Sexual Activity   Alcohol use: No   Drug use: No   Sexual activity: Not Currently  Other Topics Concern   Not on file  Social History Narrative   Right handed   Social Drivers of Health   Financial  Resource Strain: Not on file  Food Insecurity: Low Risk  (04/14/2023)   Received from [facility]   Hunger Vital Sign    Worried About Running Out of Food in the Last Year: Never true    Ran Out of Food in the Last Year: Never true  Transportation Needs: No Transportation Needs (04/14/2023)   Received from [facility]    In the past 12 months, has lack of reliable transportation kept you from medical appointments, meetings, work or from getting things needed for daily living? : No  Physical Activity: Not on file  Stress: Not on file  Social Connections: Not on file     Family History: The patient's family history includes Hypertension in his mother; Stroke in his brother and maternal grandmother.  ROS:   Please see the history of present illness.    ROS  All other systems reviewed and negative.   EKGs/Labs/Other Studies Reviewed:    The following studies were reviewed today: PAP compliance download   Recent Labs: 07/12/2023: BUN 23; Creatinine, Ser 1.05; Hemoglobin 14.7; Magnesium 2.3; Platelets 203; Potassium 5.2; Sodium 138   Recent Lipid Panel    Component Value Date/Time   CHOL 131 08/10/2018 0926   TRIG 91 08/10/2018 0926   HDL 39 (L) 08/10/2018 0926   CHOLHDL 3.4 08/10/2018 0926   LDLCALC 74 08/10/2018 0926    Physical Exam:    VS:  BP 128/72 (BP Location: Right Arm, Patient  Position: Sitting, Cuff Size: Normal)   Pulse 73   Resp 16   Ht 5\' 9"  (1.753 m)   Wt 175 lb 3.2 oz (79.5 kg)   SpO2 98%   BMI 25.87 kg/m     Wt Readings from Last 3 Encounters:  08/10/23 175 lb 3.2 oz (79.5 kg)  07/12/23 176 lb 9.6 oz (80.1 kg)  05/10/23 173 lb 9.6 oz (78.7 kg)    GEN: Well nourished, well developed in no acute distress HEENT: Normal NECK: No JVD; No carotid bruits LYMPHATICS: No lymphadenopathy CARDIAC:RRR, no murmurs, rubs, gallops RESPIRATORY:  Clear to auscultation without rales, wheezing or rhonchi  ABDOMEN: Soft, non-tender, non-distended MUSCULOSKELETAL:  No edema; No deformity  SKIN: Warm and dry NEUROLOGIC:  Alert and oriented x 3 PSYCHIATRIC:  Normal affect  ASSESSMENT:    1. OSA (obstructive sleep apnea)   2. Essential hypertension     PLAN:    In order of problems listed above:  OSA - The patient is tolerating PAP therapy well without any problems. The PAP download performed by his DME was personally reviewed and interpreted by me today and showed an AHI of 7.3/hr on 10 cm H2O with 100% compliance in using more than 4 hours nightly.  The patient has been using and benefiting from PAP use and will continue to benefit from therapy.  -I encouraged him to avoid sleeping supine   HTN -BP controlled on exam today -continue prescription drug management with Losartan 100mg  daily and Toprol XL 50mg  daily with PRN refills -I have personally reviewed and interpreted outside labs performed by patient's PCP which showed SCr 1.05, K+ 5 on 07/2023 -I have told him to stop the spironolactone due to elevated K+  and repeat BMET in 1 week -I have asked him to check his BP twice daily for a week and call with results   Medication Adjustments/Labs and Tests Ordered: Current medicines are reviewed at length with the patient today.  Concerns regarding medicines are outlined above.  No orders of the defined types were placed in this encounter.  No orders of the  defined types were placed in this encounter.   Signed, Armanda Magic, MD  08/10/2023 11:41 AM    [facility]

## 2023-08-10 ENCOUNTER — Other Ambulatory Visit: Payer: Self-pay | Admitting: Cardiology

## 2023-08-10 ENCOUNTER — Other Ambulatory Visit (HOSPITAL_COMMUNITY): Payer: Self-pay

## 2023-08-10 ENCOUNTER — Ambulatory Visit: Payer: Medicare PPO | Attending: Cardiology | Admitting: Cardiology

## 2023-08-10 ENCOUNTER — Other Ambulatory Visit: Payer: Self-pay

## 2023-08-10 VITALS — BP 128/72 | HR 73 | Resp 16 | Ht 69.0 in | Wt 175.2 lb

## 2023-08-10 DIAGNOSIS — I10 Essential (primary) hypertension: Secondary | ICD-10-CM

## 2023-08-10 DIAGNOSIS — G4733 Obstructive sleep apnea (adult) (pediatric): Secondary | ICD-10-CM

## 2023-08-10 NOTE — Patient Instructions (Signed)
 Medication Instructions:  Your physician has recommended you make the following change in your medication:  STOP: spironolactone Check your BP twice daily for 1 week.  Please call in BP readings to Dr. Mayford Knife in 1 week.  *If you need a refill on your cardiac medications before your next appointment, please call your pharmacy*   Lab Work: NONE  If you have labs (blood work) drawn today and your tests are completely normal, you will receive your results only by: MyChart Message (if you have MyChart) OR A paper copy in the mail If you have any lab test that is abnormal or we need to change your treatment, we will call you to review the results.   Testing/Procedures: NONE   Follow-Up: At [facility], you and your health needs are our priority.  As part of our continuing mission to provide you with exceptional heart care, we have created designated Provider Care Teams.  These Care Teams include your primary Cardiologist (physician) and Advanced Practice Providers (APPs -  Physician Assistants and Nurse Practitioners) who all work together to provide you with the care you need, when you need it.  We recommend signing up for the patient portal called "MyChart".  Sign up information is provided on this After Visit Summary.  MyChart is used to connect with patients for Virtual Visits (Telemedicine).  Patients are able to view lab/test results, encounter notes, upcoming appointments, etc.  Non-urgent messages can be sent to your provider as well.   To learn more about what you can do with MyChart, go to [email or web address].    Your next appointment:   1 year(s)  Provider:   Armanda Magic, MD   Other Instructions   1st Floor: - Lobby - Registration  - Pharmacy  - Lab - Cafe  2nd Floor: - PV Lab - Diagnostic Testing (echo, CT, nuclear med)  3rd Floor: - Vacant  4th Floor: - TCTS (cardiothoracic surgery) - AFib Clinic - Structural Heart Clinic - Vascular Surgery   - Vascular Ultrasound  5th Floor: - [facility] (general and EP) - Clinical Pharmacy for coumadin, hypertension, lipid, weight-loss medications, and med management appointments    Valet parking services will be available as well.

## 2023-08-10 NOTE — Addendum Note (Signed)
 Addended by: Macie Burows on: 08/10/2023 11:48 AM   Modules accepted: Orders

## 2023-08-11 ENCOUNTER — Other Ambulatory Visit (HOSPITAL_COMMUNITY): Payer: Self-pay

## 2023-08-12 ENCOUNTER — Other Ambulatory Visit (HOSPITAL_COMMUNITY): Payer: Self-pay

## 2023-08-12 MED ORDER — ROSUVASTATIN CALCIUM 40 MG PO TABS
40.0000 mg | ORAL_TABLET | Freq: Every day | ORAL | 2 refills | Status: AC
  Filled 2023-08-12: qty 90, 90d supply, fill #0
  Filled 2023-11-01: qty 90, 90d supply, fill #1

## 2023-08-15 ENCOUNTER — Other Ambulatory Visit (HOSPITAL_COMMUNITY): Payer: Self-pay

## 2023-08-15 DIAGNOSIS — M79641 Pain in right hand: Secondary | ICD-10-CM | POA: Diagnosis not present

## 2023-08-15 DIAGNOSIS — M67431 Ganglion, right wrist: Secondary | ICD-10-CM | POA: Diagnosis not present

## 2023-08-15 DIAGNOSIS — R2232 Localized swelling, mass and lump, left upper limb: Secondary | ICD-10-CM | POA: Diagnosis not present

## 2023-08-15 DIAGNOSIS — M79642 Pain in left hand: Secondary | ICD-10-CM | POA: Diagnosis not present

## 2023-08-18 ENCOUNTER — Telehealth: Payer: Self-pay | Admitting: Cardiology

## 2023-08-18 NOTE — Telephone Encounter (Signed)
 Spoke with patient and informed him I did not see his BP log in Dr. Norris Cross box. I will forward the message to Dr. Mayford Knife

## 2023-08-18 NOTE — Telephone Encounter (Signed)
 Pt stated he came by the office today to drop off his BP readings over the last week as he was advised to do at his last office visit. He wants to make sure the readings made it to Dr. Norris Cross mailbox and is requesting a callback to confirm. Please advise

## 2023-08-19 NOTE — Telephone Encounter (Signed)
 Attempted to reach pt to let him know Dr. Mayford Knife will review next week. Unable to leave a message. Form in Dr. Norris Cross box.

## 2023-08-20 ENCOUNTER — Other Ambulatory Visit (HOSPITAL_COMMUNITY): Payer: Self-pay

## 2023-08-24 NOTE — Telephone Encounter (Signed)
 I have advised the pt and I advised him that we have not heard back about his BP log yet.. Dr Mayford Knife is still reviewing... he says he is still off the Spironolactone due to elevated K.... and Dr Mayford Knife wanted to see how his BP has been doing.   I will send to Dr Mayford Knife.

## 2023-08-24 NOTE — Telephone Encounter (Signed)
 Pt stated no one got back with him last week but I did inform him that a nurse tried to contact him  but wasn't able to leave a vm. He'd like a callback to discuss further. Please advise

## 2023-09-01 ENCOUNTER — Other Ambulatory Visit (HOSPITAL_BASED_OUTPATIENT_CLINIC_OR_DEPARTMENT_OTHER): Payer: Self-pay

## 2023-09-01 DIAGNOSIS — Z79899 Other long term (current) drug therapy: Secondary | ICD-10-CM | POA: Diagnosis not present

## 2023-09-01 MED ORDER — METOPROLOL SUCCINATE ER 50 MG PO TB24
75.0000 mg | ORAL_TABLET | Freq: Every day | ORAL | 3 refills | Status: AC
  Filled 2023-09-01 – 2023-09-21 (×2): qty 135, 90d supply, fill #0
  Filled 2023-12-24: qty 135, 90d supply, fill #1
  Filled 2024-03-21: qty 135, 90d supply, fill #2
  Filled 2024-06-19: qty 135, 90d supply, fill #0

## 2023-09-01 NOTE — Telephone Encounter (Signed)
 Spoke with the patient and he is aware of recommendations from Dr. Mayford Knife. Patient verbalized understanding.

## 2023-09-01 NOTE — Telephone Encounter (Signed)
 Per Dr. Mayford Knife patient's blood pressures are still elevated. He needs to increase his Toprol XL to 75 mg daily. Check blood pressure and heart rate for one week and call with readings. BP has been sent to be scanned in to patient's chart.

## 2023-09-02 ENCOUNTER — Encounter (INDEPENDENT_AMBULATORY_CARE_PROVIDER_SITE_OTHER): Payer: Medicare PPO | Admitting: Ophthalmology

## 2023-09-02 DIAGNOSIS — H35033 Hypertensive retinopathy, bilateral: Secondary | ICD-10-CM | POA: Diagnosis not present

## 2023-09-02 DIAGNOSIS — Z7985 Long-term (current) use of injectable non-insulin antidiabetic drugs: Secondary | ICD-10-CM

## 2023-09-02 DIAGNOSIS — I10 Essential (primary) hypertension: Secondary | ICD-10-CM | POA: Diagnosis not present

## 2023-09-02 DIAGNOSIS — E113391 Type 2 diabetes mellitus with moderate nonproliferative diabetic retinopathy without macular edema, right eye: Secondary | ICD-10-CM

## 2023-09-02 DIAGNOSIS — E113312 Type 2 diabetes mellitus with moderate nonproliferative diabetic retinopathy with macular edema, left eye: Secondary | ICD-10-CM | POA: Diagnosis not present

## 2023-09-02 DIAGNOSIS — H33303 Unspecified retinal break, bilateral: Secondary | ICD-10-CM

## 2023-09-02 DIAGNOSIS — H43813 Vitreous degeneration, bilateral: Secondary | ICD-10-CM

## 2023-09-20 ENCOUNTER — Other Ambulatory Visit (HOSPITAL_COMMUNITY): Payer: Self-pay

## 2023-09-21 ENCOUNTER — Other Ambulatory Visit (HOSPITAL_COMMUNITY): Payer: Self-pay

## 2023-09-23 ENCOUNTER — Other Ambulatory Visit (HOSPITAL_COMMUNITY): Payer: Self-pay

## 2023-09-26 ENCOUNTER — Other Ambulatory Visit: Payer: Self-pay

## 2023-09-27 ENCOUNTER — Other Ambulatory Visit (HOSPITAL_COMMUNITY): Payer: Self-pay

## 2023-09-28 ENCOUNTER — Other Ambulatory Visit (HOSPITAL_COMMUNITY): Payer: Self-pay

## 2023-09-29 ENCOUNTER — Other Ambulatory Visit (HOSPITAL_COMMUNITY): Payer: Self-pay

## 2023-09-30 ENCOUNTER — Encounter (INDEPENDENT_AMBULATORY_CARE_PROVIDER_SITE_OTHER): Admitting: Ophthalmology

## 2023-09-30 DIAGNOSIS — E113312 Type 2 diabetes mellitus with moderate nonproliferative diabetic retinopathy with macular edema, left eye: Secondary | ICD-10-CM

## 2023-09-30 DIAGNOSIS — E113391 Type 2 diabetes mellitus with moderate nonproliferative diabetic retinopathy without macular edema, right eye: Secondary | ICD-10-CM | POA: Diagnosis not present

## 2023-09-30 DIAGNOSIS — I10 Essential (primary) hypertension: Secondary | ICD-10-CM | POA: Diagnosis not present

## 2023-09-30 DIAGNOSIS — Z7985 Long-term (current) use of injectable non-insulin antidiabetic drugs: Secondary | ICD-10-CM

## 2023-09-30 DIAGNOSIS — H35033 Hypertensive retinopathy, bilateral: Secondary | ICD-10-CM

## 2023-09-30 DIAGNOSIS — H43813 Vitreous degeneration, bilateral: Secondary | ICD-10-CM

## 2023-09-30 DIAGNOSIS — H33303 Unspecified retinal break, bilateral: Secondary | ICD-10-CM | POA: Diagnosis not present

## 2023-10-13 ENCOUNTER — Other Ambulatory Visit (HOSPITAL_COMMUNITY): Payer: Self-pay

## 2023-10-13 ENCOUNTER — Telehealth: Payer: Self-pay | Admitting: Cardiology

## 2023-10-13 NOTE — Telephone Encounter (Signed)
 Pt states that he is returning a call that he received this morning. Please advise

## 2023-10-13 NOTE — Telephone Encounter (Signed)
 Spoke with patient and he states he is returning a call to Anthony Cole form Dr. Broadus Canes office.. She called him at 938 this am.  I do not see an encounter from today

## 2023-10-13 NOTE — Telephone Encounter (Signed)
 Spoke with pt regarding Dr. Charl Concha suggestions to start Amlodipine 5 mg once daily. Pt is confused because his Toprol  XL had been increased from what he thought were the readings he sent in. Pt would like to take his blood pressure readings again for 1 week and send them to us  before making any changes. Pt was told this information would be sent to Dr. Micael Adas for suggestions. Pt was told to keep track of his Blood pressure for 1 week and send us  the readings. Pt verbalized understanding. All questions if any were answered.

## 2023-10-13 NOTE — Telephone Encounter (Signed)
 BP readings will be uploaded to the chart

## 2023-10-19 NOTE — Telephone Encounter (Signed)
 Left voice message to call back 5/14

## 2023-10-21 DIAGNOSIS — R5381 Other malaise: Secondary | ICD-10-CM | POA: Diagnosis not present

## 2023-10-21 DIAGNOSIS — F418 Other specified anxiety disorders: Secondary | ICD-10-CM | POA: Diagnosis not present

## 2023-10-21 DIAGNOSIS — I10 Essential (primary) hypertension: Secondary | ICD-10-CM | POA: Diagnosis not present

## 2023-10-21 DIAGNOSIS — R972 Elevated prostate specific antigen [PSA]: Secondary | ICD-10-CM | POA: Diagnosis not present

## 2023-10-21 DIAGNOSIS — Z125 Encounter for screening for malignant neoplasm of prostate: Secondary | ICD-10-CM | POA: Diagnosis not present

## 2023-10-21 DIAGNOSIS — Z6825 Body mass index (BMI) 25.0-25.9, adult: Secondary | ICD-10-CM | POA: Diagnosis not present

## 2023-10-22 ENCOUNTER — Other Ambulatory Visit (HOSPITAL_COMMUNITY): Payer: Self-pay

## 2023-10-27 DIAGNOSIS — L718 Other rosacea: Secondary | ICD-10-CM | POA: Diagnosis not present

## 2023-10-27 DIAGNOSIS — L578 Other skin changes due to chronic exposure to nonionizing radiation: Secondary | ICD-10-CM | POA: Diagnosis not present

## 2023-10-27 DIAGNOSIS — L218 Other seborrheic dermatitis: Secondary | ICD-10-CM | POA: Diagnosis not present

## 2023-11-01 ENCOUNTER — Other Ambulatory Visit: Payer: Self-pay

## 2023-11-01 ENCOUNTER — Other Ambulatory Visit (HOSPITAL_COMMUNITY): Payer: Self-pay

## 2023-11-01 ENCOUNTER — Encounter (INDEPENDENT_AMBULATORY_CARE_PROVIDER_SITE_OTHER): Admitting: Ophthalmology

## 2023-11-01 DIAGNOSIS — I10 Essential (primary) hypertension: Secondary | ICD-10-CM | POA: Diagnosis not present

## 2023-11-01 DIAGNOSIS — H35033 Hypertensive retinopathy, bilateral: Secondary | ICD-10-CM

## 2023-11-01 DIAGNOSIS — H2513 Age-related nuclear cataract, bilateral: Secondary | ICD-10-CM | POA: Diagnosis not present

## 2023-11-01 DIAGNOSIS — H33303 Unspecified retinal break, bilateral: Secondary | ICD-10-CM

## 2023-11-01 DIAGNOSIS — H35373 Puckering of macula, bilateral: Secondary | ICD-10-CM | POA: Diagnosis not present

## 2023-11-01 DIAGNOSIS — Z7985 Long-term (current) use of injectable non-insulin antidiabetic drugs: Secondary | ICD-10-CM

## 2023-11-01 DIAGNOSIS — E113312 Type 2 diabetes mellitus with moderate nonproliferative diabetic retinopathy with macular edema, left eye: Secondary | ICD-10-CM

## 2023-11-01 DIAGNOSIS — E113291 Type 2 diabetes mellitus with mild nonproliferative diabetic retinopathy without macular edema, right eye: Secondary | ICD-10-CM

## 2023-11-01 DIAGNOSIS — H43813 Vitreous degeneration, bilateral: Secondary | ICD-10-CM

## 2023-11-02 ENCOUNTER — Other Ambulatory Visit: Payer: Self-pay

## 2023-11-02 MED ORDER — JARDIANCE 10 MG PO TABS
10.0000 mg | ORAL_TABLET | Freq: Every day | ORAL | 0 refills | Status: AC
  Filled 2023-11-02: qty 90, 90d supply, fill #0

## 2023-11-02 NOTE — Telephone Encounter (Signed)
 Pt notified via MyChart of Dr. Charl Concha suggestion. Last read by Sondra Duran "Rocky" at 11:43AM on 11/02/2023.

## 2023-11-03 ENCOUNTER — Other Ambulatory Visit (HOSPITAL_COMMUNITY): Payer: Self-pay

## 2023-11-03 ENCOUNTER — Other Ambulatory Visit: Payer: Self-pay

## 2023-11-04 ENCOUNTER — Other Ambulatory Visit: Payer: Self-pay

## 2023-11-10 DIAGNOSIS — N401 Enlarged prostate with lower urinary tract symptoms: Secondary | ICD-10-CM | POA: Diagnosis not present

## 2023-11-10 DIAGNOSIS — N138 Other obstructive and reflux uropathy: Secondary | ICD-10-CM | POA: Diagnosis not present

## 2023-11-10 DIAGNOSIS — R972 Elevated prostate specific antigen [PSA]: Secondary | ICD-10-CM | POA: Diagnosis not present

## 2023-11-10 DIAGNOSIS — N281 Cyst of kidney, acquired: Secondary | ICD-10-CM | POA: Diagnosis not present

## 2023-11-14 ENCOUNTER — Other Ambulatory Visit (HOSPITAL_COMMUNITY): Payer: Self-pay

## 2023-11-25 DIAGNOSIS — G4733 Obstructive sleep apnea (adult) (pediatric): Secondary | ICD-10-CM | POA: Diagnosis not present

## 2023-12-01 ENCOUNTER — Other Ambulatory Visit (HOSPITAL_BASED_OUTPATIENT_CLINIC_OR_DEPARTMENT_OTHER): Payer: Self-pay

## 2023-12-05 ENCOUNTER — Other Ambulatory Visit (HOSPITAL_COMMUNITY): Payer: Self-pay

## 2023-12-05 ENCOUNTER — Encounter (INDEPENDENT_AMBULATORY_CARE_PROVIDER_SITE_OTHER): Admitting: Ophthalmology

## 2023-12-05 ENCOUNTER — Other Ambulatory Visit (HOSPITAL_BASED_OUTPATIENT_CLINIC_OR_DEPARTMENT_OTHER): Payer: Self-pay

## 2023-12-05 ENCOUNTER — Other Ambulatory Visit: Payer: Self-pay

## 2023-12-05 DIAGNOSIS — E113391 Type 2 diabetes mellitus with moderate nonproliferative diabetic retinopathy without macular edema, right eye: Secondary | ICD-10-CM | POA: Diagnosis not present

## 2023-12-05 DIAGNOSIS — Z7985 Long-term (current) use of injectable non-insulin antidiabetic drugs: Secondary | ICD-10-CM | POA: Diagnosis not present

## 2023-12-05 DIAGNOSIS — H43813 Vitreous degeneration, bilateral: Secondary | ICD-10-CM | POA: Diagnosis not present

## 2023-12-05 DIAGNOSIS — H35033 Hypertensive retinopathy, bilateral: Secondary | ICD-10-CM | POA: Diagnosis not present

## 2023-12-05 DIAGNOSIS — H33303 Unspecified retinal break, bilateral: Secondary | ICD-10-CM

## 2023-12-05 DIAGNOSIS — I10 Essential (primary) hypertension: Secondary | ICD-10-CM

## 2023-12-05 DIAGNOSIS — E113312 Type 2 diabetes mellitus with moderate nonproliferative diabetic retinopathy with macular edema, left eye: Secondary | ICD-10-CM | POA: Diagnosis not present

## 2023-12-05 MED ORDER — CIPROFLOXACIN HCL 0.3 % OP SOLN
OPHTHALMIC | 12 refills | Status: AC
  Filled 2023-12-05: qty 5, 30d supply, fill #0
  Filled 2024-04-19: qty 5, 30d supply, fill #1
  Filled 2024-06-04: qty 5, 30d supply, fill #0

## 2023-12-16 ENCOUNTER — Other Ambulatory Visit (HOSPITAL_COMMUNITY): Payer: Self-pay

## 2023-12-16 ENCOUNTER — Other Ambulatory Visit: Payer: Self-pay | Admitting: Cardiology

## 2023-12-16 MED ORDER — DOFETILIDE 500 MCG PO CAPS
500.0000 ug | ORAL_CAPSULE | Freq: Two times a day (BID) | ORAL | 2 refills | Status: AC
  Filled 2023-12-16 – 2023-12-19 (×4): qty 180, 90d supply, fill #0
  Filled 2024-03-21 (×2): qty 180, 90d supply, fill #1
  Filled 2024-06-19 – 2024-06-22 (×5): qty 180, 90d supply, fill #0

## 2023-12-17 ENCOUNTER — Other Ambulatory Visit (HOSPITAL_COMMUNITY): Payer: Self-pay

## 2023-12-19 ENCOUNTER — Other Ambulatory Visit (HOSPITAL_COMMUNITY): Payer: Self-pay

## 2023-12-20 ENCOUNTER — Other Ambulatory Visit (HOSPITAL_COMMUNITY): Payer: Self-pay

## 2023-12-24 ENCOUNTER — Other Ambulatory Visit (HOSPITAL_COMMUNITY): Payer: Self-pay

## 2023-12-26 ENCOUNTER — Other Ambulatory Visit (HOSPITAL_COMMUNITY): Payer: Self-pay

## 2023-12-26 MED ORDER — LEVOTHYROXINE SODIUM 112 MCG PO TABS
112.0000 ug | ORAL_TABLET | Freq: Every morning | ORAL | 0 refills | Status: DC
  Filled 2023-12-26: qty 90, 90d supply, fill #0

## 2024-01-02 ENCOUNTER — Other Ambulatory Visit (HOSPITAL_COMMUNITY): Payer: Self-pay

## 2024-01-04 ENCOUNTER — Other Ambulatory Visit (HOSPITAL_COMMUNITY): Payer: Self-pay

## 2024-01-09 ENCOUNTER — Other Ambulatory Visit: Payer: Self-pay

## 2024-01-09 ENCOUNTER — Encounter (INDEPENDENT_AMBULATORY_CARE_PROVIDER_SITE_OTHER): Admitting: Ophthalmology

## 2024-01-09 DIAGNOSIS — H35033 Hypertensive retinopathy, bilateral: Secondary | ICD-10-CM | POA: Diagnosis not present

## 2024-01-09 DIAGNOSIS — I10 Essential (primary) hypertension: Secondary | ICD-10-CM | POA: Diagnosis not present

## 2024-01-09 DIAGNOSIS — E113291 Type 2 diabetes mellitus with mild nonproliferative diabetic retinopathy without macular edema, right eye: Secondary | ICD-10-CM | POA: Diagnosis not present

## 2024-01-09 DIAGNOSIS — H33303 Unspecified retinal break, bilateral: Secondary | ICD-10-CM

## 2024-01-09 DIAGNOSIS — H35373 Puckering of macula, bilateral: Secondary | ICD-10-CM | POA: Diagnosis not present

## 2024-01-09 DIAGNOSIS — H43813 Vitreous degeneration, bilateral: Secondary | ICD-10-CM | POA: Diagnosis not present

## 2024-01-09 DIAGNOSIS — Z7985 Long-term (current) use of injectable non-insulin antidiabetic drugs: Secondary | ICD-10-CM

## 2024-01-09 DIAGNOSIS — E113312 Type 2 diabetes mellitus with moderate nonproliferative diabetic retinopathy with macular edema, left eye: Secondary | ICD-10-CM

## 2024-01-10 DIAGNOSIS — Z1331 Encounter for screening for depression: Secondary | ICD-10-CM | POA: Diagnosis not present

## 2024-01-10 DIAGNOSIS — Z23 Encounter for immunization: Secondary | ICD-10-CM | POA: Diagnosis not present

## 2024-01-10 DIAGNOSIS — Z0000 Encounter for general adult medical examination without abnormal findings: Secondary | ICD-10-CM | POA: Diagnosis not present

## 2024-01-12 ENCOUNTER — Other Ambulatory Visit (HOSPITAL_COMMUNITY): Payer: Self-pay

## 2024-01-13 ENCOUNTER — Other Ambulatory Visit (HOSPITAL_COMMUNITY): Payer: Self-pay

## 2024-01-13 DIAGNOSIS — I10 Essential (primary) hypertension: Secondary | ICD-10-CM | POA: Diagnosis not present

## 2024-01-13 DIAGNOSIS — Z6824 Body mass index (BMI) 24.0-24.9, adult: Secondary | ICD-10-CM | POA: Diagnosis not present

## 2024-01-13 DIAGNOSIS — F419 Anxiety disorder, unspecified: Secondary | ICD-10-CM | POA: Diagnosis not present

## 2024-01-13 DIAGNOSIS — E113391 Type 2 diabetes mellitus with moderate nonproliferative diabetic retinopathy without macular edema, right eye: Secondary | ICD-10-CM | POA: Diagnosis not present

## 2024-01-13 DIAGNOSIS — E039 Hypothyroidism, unspecified: Secondary | ICD-10-CM | POA: Diagnosis not present

## 2024-01-13 DIAGNOSIS — E11319 Type 2 diabetes mellitus with unspecified diabetic retinopathy without macular edema: Secondary | ICD-10-CM | POA: Diagnosis not present

## 2024-01-13 DIAGNOSIS — E7800 Pure hypercholesterolemia, unspecified: Secondary | ICD-10-CM | POA: Diagnosis not present

## 2024-01-13 DIAGNOSIS — I4891 Unspecified atrial fibrillation: Secondary | ICD-10-CM | POA: Diagnosis not present

## 2024-01-13 DIAGNOSIS — Z0000 Encounter for general adult medical examination without abnormal findings: Secondary | ICD-10-CM | POA: Diagnosis not present

## 2024-01-13 MED ORDER — DOXYCYCLINE HYCLATE 100 MG PO TABS
100.0000 mg | ORAL_TABLET | Freq: Two times a day (BID) | ORAL | 4 refills | Status: AC
  Filled 2024-01-13: qty 180, 90d supply, fill #0

## 2024-01-13 MED ORDER — LEVOTHYROXINE SODIUM 112 MCG PO TABS
112.0000 ug | ORAL_TABLET | Freq: Every morning | ORAL | 4 refills | Status: AC
  Filled 2024-01-13 – 2024-06-20 (×5): qty 90, 90d supply, fill #0

## 2024-01-13 MED ORDER — ROSUVASTATIN CALCIUM 40 MG PO TABS
40.0000 mg | ORAL_TABLET | Freq: Every day | ORAL | 4 refills | Status: AC
  Filled 2024-01-13 – 2024-02-03 (×2): qty 90, 90d supply, fill #0
  Filled 2024-02-03: qty 38, 38d supply, fill #0
  Filled 2024-02-03: qty 52, 52d supply, fill #0
  Filled 2024-05-08: qty 90, 90d supply, fill #1

## 2024-01-13 MED ORDER — AMLODIPINE BESYLATE 2.5 MG PO TABS
2.5000 mg | ORAL_TABLET | Freq: Every day | ORAL | 2 refills | Status: AC
  Filled 2024-01-13: qty 30, 30d supply, fill #0

## 2024-01-13 MED ORDER — TRULICITY 0.75 MG/0.5ML ~~LOC~~ SOAJ
0.7500 mg | SUBCUTANEOUS | 4 refills | Status: AC
  Filled 2024-01-13 – 2024-02-03 (×2): qty 6, 84d supply, fill #0
  Filled 2024-04-27: qty 6, 84d supply, fill #1

## 2024-01-13 MED ORDER — JARDIANCE 10 MG PO TABS
10.0000 mg | ORAL_TABLET | Freq: Every day | ORAL | 4 refills | Status: AC
  Filled 2024-01-13 – 2024-02-03 (×2): qty 90, 90d supply, fill #0
  Filled 2024-03-23 – 2024-04-28 (×3): qty 90, 90d supply, fill #1

## 2024-01-20 ENCOUNTER — Other Ambulatory Visit: Payer: Self-pay

## 2024-01-20 ENCOUNTER — Other Ambulatory Visit (HOSPITAL_BASED_OUTPATIENT_CLINIC_OR_DEPARTMENT_OTHER): Payer: Self-pay

## 2024-01-20 ENCOUNTER — Telehealth: Payer: Self-pay | Admitting: Cardiology

## 2024-01-20 ENCOUNTER — Other Ambulatory Visit: Payer: Self-pay | Admitting: Cardiology

## 2024-01-20 ENCOUNTER — Other Ambulatory Visit (HOSPITAL_COMMUNITY): Payer: Self-pay

## 2024-01-20 DIAGNOSIS — I4891 Unspecified atrial fibrillation: Secondary | ICD-10-CM

## 2024-01-20 MED ORDER — RIVAROXABAN 20 MG PO TABS
20.0000 mg | ORAL_TABLET | Freq: Every day | ORAL | 1 refills | Status: DC
  Filled 2024-01-20 – 2024-04-19 (×3): qty 90, 90d supply, fill #0
  Filled 2024-04-19: qty 90, 90d supply, fill #1

## 2024-01-20 MED ORDER — RIVAROXABAN 20 MG PO TABS
20.0000 mg | ORAL_TABLET | Freq: Every day | ORAL | 1 refills | Status: DC
  Filled 2024-01-20: qty 90, 90d supply, fill #0

## 2024-01-20 NOTE — Telephone Encounter (Signed)
 Prescription refill request for Xarelto  received.  Indication: afib  Last office visit: 08/10/2023, [place] Weight: 79.5 kg  Age: 73 yo  Scr: 1.05, 07/12/2023 CrCl: 72 ml/min   Refill sent.

## 2024-01-20 NOTE — Telephone Encounter (Signed)
*  STAT* If patient is at the pharmacy, call can be transferred to refill team.  patient is completely out of medication  1. Which medications need to be refilled? Xarelto  20 Take 1 tablet (20 mg total) by mouth daily    2. Would you like to learn more about the convenience, safety, & potential cost savings by using the [facility]? no     3. Are you open to using the [facility]  Already do    4. Which pharmacy/location (including street and city if local pharmacy) is medication to be sent to?  [facility] Phone: [phone]  Fax: [phone]      5. Do they need a 30 day or 90 day supply? 90 day

## 2024-02-01 ENCOUNTER — Other Ambulatory Visit (HOSPITAL_COMMUNITY): Payer: Self-pay

## 2024-02-01 MED ORDER — AMLODIPINE BESYLATE 5 MG PO TABS
5.0000 mg | ORAL_TABLET | Freq: Every day | ORAL | 0 refills | Status: DC
  Filled 2024-02-01: qty 30, 30d supply, fill #0

## 2024-02-03 ENCOUNTER — Other Ambulatory Visit (HOSPITAL_COMMUNITY): Payer: Self-pay

## 2024-02-08 ENCOUNTER — Other Ambulatory Visit (HOSPITAL_COMMUNITY): Payer: Self-pay

## 2024-02-10 ENCOUNTER — Other Ambulatory Visit (HOSPITAL_COMMUNITY): Payer: Self-pay

## 2024-02-10 MED ORDER — ACCU-CHEK SOFTCLIX LANCETS MISC
5 refills | Status: AC
  Filled 2024-02-10: qty 100, 50d supply, fill #0

## 2024-02-10 MED ORDER — ACCU-CHEK GUIDE TEST VI STRP
ORAL_STRIP | 5 refills | Status: AC
  Filled 2024-02-10: qty 100, 90d supply, fill #0

## 2024-02-13 ENCOUNTER — Encounter (INDEPENDENT_AMBULATORY_CARE_PROVIDER_SITE_OTHER): Admitting: Ophthalmology

## 2024-02-13 DIAGNOSIS — Z7985 Long-term (current) use of injectable non-insulin antidiabetic drugs: Secondary | ICD-10-CM

## 2024-02-13 DIAGNOSIS — H43813 Vitreous degeneration, bilateral: Secondary | ICD-10-CM

## 2024-02-13 DIAGNOSIS — I10 Essential (primary) hypertension: Secondary | ICD-10-CM | POA: Diagnosis not present

## 2024-02-13 DIAGNOSIS — E113312 Type 2 diabetes mellitus with moderate nonproliferative diabetic retinopathy with macular edema, left eye: Secondary | ICD-10-CM | POA: Diagnosis not present

## 2024-02-13 DIAGNOSIS — E113291 Type 2 diabetes mellitus with mild nonproliferative diabetic retinopathy without macular edema, right eye: Secondary | ICD-10-CM | POA: Diagnosis not present

## 2024-02-13 DIAGNOSIS — H35033 Hypertensive retinopathy, bilateral: Secondary | ICD-10-CM | POA: Diagnosis not present

## 2024-02-13 DIAGNOSIS — H33303 Unspecified retinal break, bilateral: Secondary | ICD-10-CM | POA: Diagnosis not present

## 2024-02-14 ENCOUNTER — Other Ambulatory Visit (HOSPITAL_COMMUNITY): Payer: Self-pay

## 2024-02-16 ENCOUNTER — Other Ambulatory Visit (HOSPITAL_COMMUNITY): Payer: Self-pay

## 2024-02-17 ENCOUNTER — Other Ambulatory Visit (HOSPITAL_COMMUNITY): Payer: Self-pay

## 2024-02-20 ENCOUNTER — Other Ambulatory Visit (HOSPITAL_COMMUNITY): Payer: Self-pay

## 2024-02-24 ENCOUNTER — Other Ambulatory Visit (HOSPITAL_COMMUNITY): Payer: Self-pay

## 2024-02-27 ENCOUNTER — Other Ambulatory Visit (HOSPITAL_COMMUNITY): Payer: Self-pay

## 2024-02-28 ENCOUNTER — Other Ambulatory Visit (HOSPITAL_COMMUNITY): Payer: Self-pay

## 2024-03-01 ENCOUNTER — Other Ambulatory Visit (HOSPITAL_BASED_OUTPATIENT_CLINIC_OR_DEPARTMENT_OTHER): Payer: Self-pay

## 2024-03-01 ENCOUNTER — Other Ambulatory Visit (HOSPITAL_COMMUNITY): Payer: Self-pay

## 2024-03-02 ENCOUNTER — Other Ambulatory Visit (HOSPITAL_COMMUNITY): Payer: Self-pay

## 2024-03-02 ENCOUNTER — Other Ambulatory Visit: Payer: Self-pay

## 2024-03-02 ENCOUNTER — Other Ambulatory Visit (HOSPITAL_BASED_OUTPATIENT_CLINIC_OR_DEPARTMENT_OTHER): Payer: Self-pay

## 2024-03-02 MED ORDER — AMLODIPINE BESYLATE 5 MG PO TABS
5.0000 mg | ORAL_TABLET | Freq: Every day | ORAL | 3 refills | Status: DC
  Filled 2024-03-02 (×3): qty 30, 30d supply, fill #0
  Filled 2024-03-21 – 2024-03-26 (×2): qty 30, 30d supply, fill #1
  Filled 2024-04-19: qty 30, 30d supply, fill #0
  Filled 2024-04-19: qty 30, 30d supply, fill #2
  Filled 2024-05-16 – 2024-05-17 (×2): qty 30, 30d supply, fill #1
  Filled 2024-05-17: qty 30, 30d supply, fill #0

## 2024-03-06 ENCOUNTER — Other Ambulatory Visit (HOSPITAL_COMMUNITY): Payer: Self-pay

## 2024-03-06 ENCOUNTER — Other Ambulatory Visit: Payer: Self-pay

## 2024-03-08 ENCOUNTER — Other Ambulatory Visit (HOSPITAL_COMMUNITY): Payer: Self-pay

## 2024-03-08 DIAGNOSIS — L981 Factitial dermatitis: Secondary | ICD-10-CM | POA: Diagnosis not present

## 2024-03-08 DIAGNOSIS — L821 Other seborrheic keratosis: Secondary | ICD-10-CM | POA: Diagnosis not present

## 2024-03-08 DIAGNOSIS — L98499 Non-pressure chronic ulcer of skin of other sites with unspecified severity: Secondary | ICD-10-CM | POA: Diagnosis not present

## 2024-03-08 DIAGNOSIS — L814 Other melanin hyperpigmentation: Secondary | ICD-10-CM | POA: Diagnosis not present

## 2024-03-08 DIAGNOSIS — L309 Dermatitis, unspecified: Secondary | ICD-10-CM | POA: Diagnosis not present

## 2024-03-08 DIAGNOSIS — L570 Actinic keratosis: Secondary | ICD-10-CM | POA: Diagnosis not present

## 2024-03-08 DIAGNOSIS — B001 Herpesviral vesicular dermatitis: Secondary | ICD-10-CM | POA: Diagnosis not present

## 2024-03-08 MED ORDER — VALACYCLOVIR HCL 1 G PO TABS
1000.0000 mg | ORAL_TABLET | Freq: Every day | ORAL | 1 refills | Status: AC
  Filled 2024-03-08: qty 30, 30d supply, fill #0

## 2024-03-08 MED ORDER — DESONIDE 0.05 % EX LOTN
TOPICAL_LOTION | CUTANEOUS | 1 refills | Status: AC
  Filled 2024-03-08: qty 59, 30d supply, fill #0

## 2024-03-09 ENCOUNTER — Other Ambulatory Visit (HOSPITAL_COMMUNITY): Payer: Self-pay

## 2024-03-09 ENCOUNTER — Encounter (HOSPITAL_COMMUNITY): Payer: Self-pay

## 2024-03-12 ENCOUNTER — Other Ambulatory Visit (HOSPITAL_COMMUNITY): Payer: Self-pay

## 2024-03-16 ENCOUNTER — Encounter (INDEPENDENT_AMBULATORY_CARE_PROVIDER_SITE_OTHER): Admitting: Ophthalmology

## 2024-03-16 DIAGNOSIS — I10 Essential (primary) hypertension: Secondary | ICD-10-CM | POA: Diagnosis not present

## 2024-03-16 DIAGNOSIS — H43813 Vitreous degeneration, bilateral: Secondary | ICD-10-CM | POA: Diagnosis not present

## 2024-03-16 DIAGNOSIS — Z7985 Long-term (current) use of injectable non-insulin antidiabetic drugs: Secondary | ICD-10-CM | POA: Diagnosis not present

## 2024-03-16 DIAGNOSIS — H35033 Hypertensive retinopathy, bilateral: Secondary | ICD-10-CM | POA: Diagnosis not present

## 2024-03-16 DIAGNOSIS — E113312 Type 2 diabetes mellitus with moderate nonproliferative diabetic retinopathy with macular edema, left eye: Secondary | ICD-10-CM

## 2024-03-16 DIAGNOSIS — E113391 Type 2 diabetes mellitus with moderate nonproliferative diabetic retinopathy without macular edema, right eye: Secondary | ICD-10-CM | POA: Diagnosis not present

## 2024-03-16 DIAGNOSIS — H33303 Unspecified retinal break, bilateral: Secondary | ICD-10-CM

## 2024-03-21 ENCOUNTER — Other Ambulatory Visit (HOSPITAL_COMMUNITY): Payer: Self-pay

## 2024-03-22 ENCOUNTER — Other Ambulatory Visit: Payer: Self-pay

## 2024-03-22 ENCOUNTER — Other Ambulatory Visit (HOSPITAL_COMMUNITY): Payer: Self-pay

## 2024-03-23 ENCOUNTER — Other Ambulatory Visit (HOSPITAL_COMMUNITY): Payer: Self-pay

## 2024-03-23 ENCOUNTER — Other Ambulatory Visit: Payer: Self-pay

## 2024-03-26 ENCOUNTER — Other Ambulatory Visit (HOSPITAL_COMMUNITY): Payer: Self-pay

## 2024-03-30 DIAGNOSIS — H906 Mixed conductive and sensorineural hearing loss, bilateral: Secondary | ICD-10-CM | POA: Diagnosis not present

## 2024-03-30 DIAGNOSIS — H7411 Adhesive right middle ear disease: Secondary | ICD-10-CM | POA: Diagnosis not present

## 2024-03-30 DIAGNOSIS — Z9889 Other specified postprocedural states: Secondary | ICD-10-CM | POA: Diagnosis not present

## 2024-04-04 ENCOUNTER — Ambulatory Visit: Payer: Medicare PPO | Admitting: Podiatry

## 2024-04-04 ENCOUNTER — Encounter: Payer: Self-pay | Admitting: Podiatry

## 2024-04-04 DIAGNOSIS — E119 Type 2 diabetes mellitus without complications: Secondary | ICD-10-CM

## 2024-04-04 DIAGNOSIS — Z794 Long term (current) use of insulin: Secondary | ICD-10-CM | POA: Diagnosis not present

## 2024-04-04 NOTE — Progress Notes (Signed)
 This patient presents to the office for diabetic foot exam.  This patient says there is no pain or discomfort in her feet.  No history of infection or drainage.  This patient presents to the office for foot exam due to having a history of diabetes.  Patient is taking xarelto.  Vascular  Dorsalis pedis and posterior tibial pulses are palpable  B/L.  Capillary return  WNL.  Temperature gradient is  WNL.  Skin turgor  WNL  Sensorium  Senn Weinstein monofilament wire  WNL. Normal tactile sensation.  Nail Exam  Patient has normal nails with no evidence of bacterial or fungal infection.  Orthopedic  Exam  Muscle tone and muscle strength  WNL.  No limitations of motion feet  B/L.  No crepitus or joint effusion noted.  Foot type is unremarkable and digits show no abnormalities.  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  Skin  No open lesions.  Normal skin texture and turgor.   Diabetes with no complications  Diabetic foot exam was performed.  There is no evidence of vascular or neurologic pathology.  RTC  1 year.   Helane Gunther DPM

## 2024-04-05 ENCOUNTER — Other Ambulatory Visit (HOSPITAL_COMMUNITY): Payer: Self-pay

## 2024-04-05 DIAGNOSIS — Z23 Encounter for immunization: Secondary | ICD-10-CM | POA: Diagnosis not present

## 2024-04-09 DIAGNOSIS — Z23 Encounter for immunization: Secondary | ICD-10-CM | POA: Diagnosis not present

## 2024-04-19 ENCOUNTER — Encounter (INDEPENDENT_AMBULATORY_CARE_PROVIDER_SITE_OTHER): Admitting: Ophthalmology

## 2024-04-19 ENCOUNTER — Other Ambulatory Visit (HOSPITAL_COMMUNITY): Payer: Self-pay

## 2024-04-19 ENCOUNTER — Other Ambulatory Visit (HOSPITAL_BASED_OUTPATIENT_CLINIC_OR_DEPARTMENT_OTHER): Payer: Self-pay

## 2024-04-19 DIAGNOSIS — I10 Essential (primary) hypertension: Secondary | ICD-10-CM | POA: Diagnosis not present

## 2024-04-19 DIAGNOSIS — H33303 Unspecified retinal break, bilateral: Secondary | ICD-10-CM | POA: Diagnosis not present

## 2024-04-19 DIAGNOSIS — E113312 Type 2 diabetes mellitus with moderate nonproliferative diabetic retinopathy with macular edema, left eye: Secondary | ICD-10-CM

## 2024-04-19 DIAGNOSIS — E113291 Type 2 diabetes mellitus with mild nonproliferative diabetic retinopathy without macular edema, right eye: Secondary | ICD-10-CM

## 2024-04-19 DIAGNOSIS — H43813 Vitreous degeneration, bilateral: Secondary | ICD-10-CM

## 2024-04-19 DIAGNOSIS — Z7985 Long-term (current) use of injectable non-insulin antidiabetic drugs: Secondary | ICD-10-CM

## 2024-04-19 DIAGNOSIS — H35033 Hypertensive retinopathy, bilateral: Secondary | ICD-10-CM

## 2024-04-20 ENCOUNTER — Other Ambulatory Visit (HOSPITAL_COMMUNITY): Payer: Self-pay

## 2024-04-27 ENCOUNTER — Other Ambulatory Visit (HOSPITAL_COMMUNITY): Payer: Self-pay

## 2024-04-28 ENCOUNTER — Other Ambulatory Visit (HOSPITAL_BASED_OUTPATIENT_CLINIC_OR_DEPARTMENT_OTHER): Payer: Self-pay

## 2024-04-28 ENCOUNTER — Other Ambulatory Visit (HOSPITAL_COMMUNITY): Payer: Self-pay

## 2024-05-08 ENCOUNTER — Other Ambulatory Visit (HOSPITAL_BASED_OUTPATIENT_CLINIC_OR_DEPARTMENT_OTHER): Payer: Self-pay

## 2024-05-16 ENCOUNTER — Other Ambulatory Visit (HOSPITAL_COMMUNITY): Payer: Self-pay

## 2024-05-16 ENCOUNTER — Other Ambulatory Visit: Payer: Self-pay | Admitting: Cardiology

## 2024-05-17 ENCOUNTER — Other Ambulatory Visit (HOSPITAL_COMMUNITY): Payer: Self-pay

## 2024-05-17 ENCOUNTER — Other Ambulatory Visit: Payer: Self-pay | Admitting: Cardiology

## 2024-05-17 DIAGNOSIS — N401 Enlarged prostate with lower urinary tract symptoms: Secondary | ICD-10-CM | POA: Diagnosis not present

## 2024-05-17 DIAGNOSIS — N281 Cyst of kidney, acquired: Secondary | ICD-10-CM | POA: Diagnosis not present

## 2024-05-17 DIAGNOSIS — N138 Other obstructive and reflux uropathy: Secondary | ICD-10-CM | POA: Diagnosis not present

## 2024-05-17 DIAGNOSIS — R972 Elevated prostate specific antigen [PSA]: Secondary | ICD-10-CM | POA: Diagnosis not present

## 2024-05-17 MED ORDER — AMLODIPINE BESYLATE 5 MG PO TABS
5.0000 mg | ORAL_TABLET | Freq: Every day | ORAL | 0 refills | Status: AC
  Filled 2024-06-19: qty 90, 90d supply, fill #0

## 2024-05-17 MED ORDER — LOSARTAN POTASSIUM 100 MG PO TABS
100.0000 mg | ORAL_TABLET | Freq: Every day | ORAL | 3 refills | Status: AC
  Filled 2024-05-17 (×2): qty 90, 90d supply, fill #0

## 2024-05-18 ENCOUNTER — Other Ambulatory Visit (HOSPITAL_COMMUNITY): Payer: Self-pay

## 2024-05-21 ENCOUNTER — Other Ambulatory Visit (HOSPITAL_COMMUNITY): Payer: Self-pay

## 2024-05-24 ENCOUNTER — Encounter (INDEPENDENT_AMBULATORY_CARE_PROVIDER_SITE_OTHER): Admitting: Ophthalmology

## 2024-05-24 DIAGNOSIS — E113391 Type 2 diabetes mellitus with moderate nonproliferative diabetic retinopathy without macular edema, right eye: Secondary | ICD-10-CM

## 2024-05-24 DIAGNOSIS — H33303 Unspecified retinal break, bilateral: Secondary | ICD-10-CM

## 2024-05-24 DIAGNOSIS — H35372 Puckering of macula, left eye: Secondary | ICD-10-CM

## 2024-05-24 DIAGNOSIS — H35033 Hypertensive retinopathy, bilateral: Secondary | ICD-10-CM | POA: Diagnosis not present

## 2024-05-24 DIAGNOSIS — H43813 Vitreous degeneration, bilateral: Secondary | ICD-10-CM | POA: Diagnosis not present

## 2024-05-24 DIAGNOSIS — I10 Essential (primary) hypertension: Secondary | ICD-10-CM

## 2024-05-24 DIAGNOSIS — E113312 Type 2 diabetes mellitus with moderate nonproliferative diabetic retinopathy with macular edema, left eye: Secondary | ICD-10-CM | POA: Diagnosis not present

## 2024-05-24 DIAGNOSIS — Z7985 Long-term (current) use of injectable non-insulin antidiabetic drugs: Secondary | ICD-10-CM | POA: Diagnosis not present

## 2024-05-26 ENCOUNTER — Other Ambulatory Visit (HOSPITAL_BASED_OUTPATIENT_CLINIC_OR_DEPARTMENT_OTHER): Payer: Self-pay

## 2024-06-04 ENCOUNTER — Other Ambulatory Visit (HOSPITAL_COMMUNITY): Payer: Self-pay

## 2024-06-19 ENCOUNTER — Other Ambulatory Visit (HOSPITAL_COMMUNITY): Payer: Self-pay

## 2024-06-20 ENCOUNTER — Other Ambulatory Visit: Payer: Self-pay

## 2024-06-20 ENCOUNTER — Other Ambulatory Visit (HOSPITAL_BASED_OUTPATIENT_CLINIC_OR_DEPARTMENT_OTHER): Payer: Self-pay

## 2024-06-21 ENCOUNTER — Other Ambulatory Visit (HOSPITAL_BASED_OUTPATIENT_CLINIC_OR_DEPARTMENT_OTHER): Payer: Self-pay

## 2024-06-22 ENCOUNTER — Other Ambulatory Visit (HOSPITAL_BASED_OUTPATIENT_CLINIC_OR_DEPARTMENT_OTHER): Payer: Self-pay

## 2024-06-22 ENCOUNTER — Other Ambulatory Visit: Payer: Self-pay

## 2024-06-25 ENCOUNTER — Encounter (INDEPENDENT_AMBULATORY_CARE_PROVIDER_SITE_OTHER): Admitting: Ophthalmology

## 2024-06-25 DIAGNOSIS — E113312 Type 2 diabetes mellitus with moderate nonproliferative diabetic retinopathy with macular edema, left eye: Secondary | ICD-10-CM

## 2024-06-25 DIAGNOSIS — I10 Essential (primary) hypertension: Secondary | ICD-10-CM

## 2024-06-25 DIAGNOSIS — H33303 Unspecified retinal break, bilateral: Secondary | ICD-10-CM | POA: Diagnosis not present

## 2024-06-25 DIAGNOSIS — H43813 Vitreous degeneration, bilateral: Secondary | ICD-10-CM | POA: Diagnosis not present

## 2024-06-25 DIAGNOSIS — E113391 Type 2 diabetes mellitus with moderate nonproliferative diabetic retinopathy without macular edema, right eye: Secondary | ICD-10-CM | POA: Diagnosis not present

## 2024-06-25 DIAGNOSIS — H35033 Hypertensive retinopathy, bilateral: Secondary | ICD-10-CM

## 2024-06-25 DIAGNOSIS — Z7985 Long-term (current) use of injectable non-insulin antidiabetic drugs: Secondary | ICD-10-CM | POA: Diagnosis not present

## 2024-07-04 ENCOUNTER — Other Ambulatory Visit (HOSPITAL_COMMUNITY): Payer: Self-pay

## 2024-07-12 ENCOUNTER — Other Ambulatory Visit (HOSPITAL_COMMUNITY): Payer: Self-pay

## 2024-07-12 ENCOUNTER — Other Ambulatory Visit (HOSPITAL_BASED_OUTPATIENT_CLINIC_OR_DEPARTMENT_OTHER): Payer: Self-pay

## 2024-07-12 ENCOUNTER — Other Ambulatory Visit: Payer: Self-pay | Admitting: Cardiology

## 2024-07-12 DIAGNOSIS — I4891 Unspecified atrial fibrillation: Secondary | ICD-10-CM

## 2024-07-12 MED ORDER — RIVAROXABAN 20 MG PO TABS
20.0000 mg | ORAL_TABLET | Freq: Every day | ORAL | 1 refills | Status: AC
  Filled 2024-07-12 (×2): qty 90, 90d supply, fill #0

## 2024-07-30 ENCOUNTER — Encounter (INDEPENDENT_AMBULATORY_CARE_PROVIDER_SITE_OTHER): Admitting: Ophthalmology

## 2024-09-18 ENCOUNTER — Ambulatory Visit: Admitting: Cardiology

## 2025-04-04 ENCOUNTER — Ambulatory Visit: Admitting: Podiatry
